# Patient Record
Sex: Female | Born: 1935
Health system: Southern US, Community
[De-identification: ages and names within clinical notes are randomized; demographics above are authoritative.]

## PROBLEM LIST (undated history)

## (undated) DIAGNOSIS — Z9049 Acquired absence of other specified parts of digestive tract: Secondary | ICD-10-CM

## (undated) DIAGNOSIS — E669 Obesity, unspecified: Secondary | ICD-10-CM

## (undated) DIAGNOSIS — Z7901 Long term (current) use of anticoagulants: Secondary | ICD-10-CM

## (undated) DIAGNOSIS — H9190 Unspecified hearing loss, unspecified ear: Secondary | ICD-10-CM

## (undated) DIAGNOSIS — E041 Nontoxic single thyroid nodule: Secondary | ICD-10-CM

## (undated) DIAGNOSIS — E785 Hyperlipidemia, unspecified: Secondary | ICD-10-CM

## (undated) DIAGNOSIS — Z8719 Personal history of other diseases of the digestive system: Secondary | ICD-10-CM

## (undated) DIAGNOSIS — I10 Essential (primary) hypertension: Secondary | ICD-10-CM

## (undated) DIAGNOSIS — I82409 Acute embolism and thrombosis of unspecified deep veins of unspecified lower extremity: Secondary | ICD-10-CM

## (undated) DIAGNOSIS — D682 Hereditary deficiency of other clotting factors: Secondary | ICD-10-CM

## (undated) DIAGNOSIS — M1990 Unspecified osteoarthritis, unspecified site: Secondary | ICD-10-CM

## (undated) DIAGNOSIS — I5189 Other ill-defined heart diseases: Secondary | ICD-10-CM

## (undated) DIAGNOSIS — I700 Atherosclerosis of aorta: Secondary | ICD-10-CM

## (undated) DIAGNOSIS — L989 Disorder of the skin and subcutaneous tissue, unspecified: Secondary | ICD-10-CM

## (undated) DIAGNOSIS — C801 Malignant (primary) neoplasm, unspecified: Secondary | ICD-10-CM

## (undated) DIAGNOSIS — D696 Thrombocytopenia, unspecified: Secondary | ICD-10-CM

## (undated) DIAGNOSIS — D6851 Activated protein C resistance: Secondary | ICD-10-CM

## (undated) DIAGNOSIS — Z96649 Presence of unspecified artificial hip joint: Secondary | ICD-10-CM

## (undated) DIAGNOSIS — N183 Chronic kidney disease, stage 3 unspecified: Secondary | ICD-10-CM

## (undated) DIAGNOSIS — R7303 Prediabetes: Secondary | ICD-10-CM

## (undated) DIAGNOSIS — R0600 Dyspnea, unspecified: Secondary | ICD-10-CM

## (undated) DIAGNOSIS — I8393 Asymptomatic varicose veins of bilateral lower extremities: Secondary | ICD-10-CM

## (undated) DIAGNOSIS — M1712 Unilateral primary osteoarthritis, left knee: Secondary | ICD-10-CM

## (undated) DIAGNOSIS — K219 Gastro-esophageal reflux disease without esophagitis: Secondary | ICD-10-CM

## (undated) DIAGNOSIS — I2699 Other pulmonary embolism without acute cor pulmonale: Secondary | ICD-10-CM

## (undated) DIAGNOSIS — M810 Age-related osteoporosis without current pathological fracture: Secondary | ICD-10-CM

## (undated) DIAGNOSIS — R7309 Other abnormal glucose: Secondary | ICD-10-CM

## (undated) DIAGNOSIS — R630 Anorexia: Secondary | ICD-10-CM

## (undated) DIAGNOSIS — I4891 Unspecified atrial fibrillation: Secondary | ICD-10-CM

## (undated) DIAGNOSIS — J189 Pneumonia, unspecified organism: Secondary | ICD-10-CM

## (undated) DIAGNOSIS — D508 Other iron deficiency anemias: Secondary | ICD-10-CM

## (undated) DIAGNOSIS — E7800 Pure hypercholesterolemia, unspecified: Secondary | ICD-10-CM

## (undated) HISTORY — DX: Unspecified hearing loss, unspecified ear: H91.90

## (undated) HISTORY — PX: CATARACT EXTRACTION: SUR2

## (undated) HISTORY — DX: Other iron deficiency anemias: D50.8

## (undated) HISTORY — DX: Essential (primary) hypertension: I10

## (undated) HISTORY — DX: Other abnormal glucose: R73.09

## (undated) HISTORY — DX: Age-related osteoporosis without current pathological fracture: M81.0

## (undated) HISTORY — PX: KNEE ARTHROSCOPY: SUR90

## (undated) HISTORY — PX: COLONOSCOPY: SHX174

## (undated) HISTORY — DX: Unspecified osteoarthritis, unspecified site: M19.90

## (undated) HISTORY — DX: Hyperlipidemia, unspecified: E78.5

## (undated) HISTORY — DX: Chronic kidney disease, stage 3 unspecified: N18.30

## (undated) HISTORY — DX: Presence of unspecified artificial hip joint: Z96.649

## (undated) HISTORY — DX: Atherosclerosis of aorta: I70.0

## (undated) HISTORY — DX: Asymptomatic varicose veins of bilateral lower extremities: I83.93

## (undated) HISTORY — PX: RETINAL LASER PROCEDURE: SHX2339

## (undated) HISTORY — DX: Obesity, unspecified: E66.9

## (undated) HISTORY — DX: Disorder of the skin and subcutaneous tissue, unspecified: L98.9

## (undated) HISTORY — DX: Nontoxic single thyroid nodule: E04.1

## (undated) HISTORY — DX: Pure hypercholesterolemia, unspecified: E78.00

## (undated) HISTORY — DX: Acquired absence of other specified parts of digestive tract: Z90.49

## (undated) HISTORY — DX: Hereditary deficiency of other clotting factors: D68.2

## (undated) HISTORY — DX: Unspecified atrial fibrillation: I48.91

## (undated) HISTORY — DX: Other pulmonary embolism without acute cor pulmonale: I26.99

## (undated) HISTORY — DX: Anorexia: R63.0

## (undated) HISTORY — DX: Long term (current) use of anticoagulants: Z79.01

## (undated) HISTORY — DX: Other ill-defined heart diseases: I51.89

## (undated) HISTORY — DX: Acute embolism and thrombosis of unspecified deep veins of unspecified lower extremity: I82.409

## (undated) HISTORY — PX: APPENDECTOMY: SHX54

## (undated) HISTORY — DX: Thrombocytopenia, unspecified: D69.6

## (undated) HISTORY — DX: Prediabetes: R73.03

## (undated) HISTORY — PX: VENA CAVA FILTER PLACEMENT: SUR1032

---

## 1971-10-18 HISTORY — PX: ABDOMINAL HYSTERECTOMY: SHX81

## 2001-08-24 ENCOUNTER — Encounter: Payer: Self-pay | Admitting: Internal Medicine

## 2001-08-24 ENCOUNTER — Encounter: Admission: RE | Admit: 2001-08-24 | Discharge: 2001-08-24 | Payer: Self-pay | Admitting: Internal Medicine

## 2002-09-10 ENCOUNTER — Encounter: Admission: RE | Admit: 2002-09-10 | Discharge: 2002-09-10 | Payer: Self-pay | Admitting: Internal Medicine

## 2002-09-10 ENCOUNTER — Encounter: Payer: Self-pay | Admitting: Internal Medicine

## 2002-10-17 HISTORY — PX: KNEE ARTHROSCOPY: SUR90

## 2002-11-11 ENCOUNTER — Ambulatory Visit (HOSPITAL_COMMUNITY): Admission: RE | Admit: 2002-11-11 | Discharge: 2002-11-11 | Payer: Self-pay | Admitting: Gastroenterology

## 2003-10-15 ENCOUNTER — Encounter: Admission: RE | Admit: 2003-10-15 | Discharge: 2003-10-15 | Payer: Self-pay | Admitting: Internal Medicine

## 2004-11-19 ENCOUNTER — Other Ambulatory Visit: Admission: RE | Admit: 2004-11-19 | Discharge: 2004-11-19 | Payer: Self-pay | Admitting: Internal Medicine

## 2004-11-23 ENCOUNTER — Encounter: Admission: RE | Admit: 2004-11-23 | Discharge: 2004-11-23 | Payer: Self-pay | Admitting: Internal Medicine

## 2004-11-29 ENCOUNTER — Encounter: Admission: RE | Admit: 2004-11-29 | Discharge: 2004-11-29 | Payer: Self-pay | Admitting: Internal Medicine

## 2006-12-22 ENCOUNTER — Encounter: Admission: RE | Admit: 2006-12-22 | Discharge: 2006-12-22 | Payer: Self-pay | Admitting: Internal Medicine

## 2008-02-18 ENCOUNTER — Encounter: Admission: RE | Admit: 2008-02-18 | Discharge: 2008-02-18 | Payer: Self-pay | Admitting: Internal Medicine

## 2009-03-12 ENCOUNTER — Encounter: Admission: RE | Admit: 2009-03-12 | Discharge: 2009-03-12 | Payer: Self-pay | Admitting: Internal Medicine

## 2009-10-17 DIAGNOSIS — I2699 Other pulmonary embolism without acute cor pulmonale: Secondary | ICD-10-CM

## 2009-10-17 HISTORY — DX: Other pulmonary embolism without acute cor pulmonale: I26.99

## 2009-10-17 HISTORY — PX: VENA CAVA FILTER PLACEMENT: SUR1032

## 2009-12-02 ENCOUNTER — Ambulatory Visit: Payer: Self-pay | Admitting: Internal Medicine

## 2009-12-02 ENCOUNTER — Inpatient Hospital Stay (HOSPITAL_COMMUNITY): Admission: EM | Admit: 2009-12-02 | Discharge: 2010-01-01 | Payer: Self-pay | Admitting: *Deleted

## 2009-12-02 ENCOUNTER — Encounter: Payer: Self-pay | Admitting: Internal Medicine

## 2009-12-03 ENCOUNTER — Ambulatory Visit: Payer: Self-pay | Admitting: Vascular Surgery

## 2009-12-03 ENCOUNTER — Encounter (INDEPENDENT_AMBULATORY_CARE_PROVIDER_SITE_OTHER): Payer: Self-pay | Admitting: Internal Medicine

## 2009-12-04 ENCOUNTER — Encounter (INDEPENDENT_AMBULATORY_CARE_PROVIDER_SITE_OTHER): Payer: Self-pay | Admitting: Pulmonary Disease

## 2009-12-17 ENCOUNTER — Encounter: Payer: Self-pay | Admitting: Internal Medicine

## 2009-12-17 ENCOUNTER — Ambulatory Visit: Payer: Self-pay | Admitting: Vascular Surgery

## 2009-12-21 ENCOUNTER — Encounter (INDEPENDENT_AMBULATORY_CARE_PROVIDER_SITE_OTHER): Payer: Self-pay | Admitting: Pulmonary Disease

## 2009-12-22 ENCOUNTER — Ambulatory Visit: Payer: Self-pay | Admitting: Oncology

## 2009-12-29 ENCOUNTER — Ambulatory Visit: Payer: Self-pay | Admitting: Physical Medicine & Rehabilitation

## 2010-01-11 ENCOUNTER — Telehealth: Payer: Self-pay | Admitting: Internal Medicine

## 2010-01-14 ENCOUNTER — Ambulatory Visit: Payer: Self-pay | Admitting: Emergency Medicine

## 2010-01-14 ENCOUNTER — Encounter: Payer: Self-pay | Admitting: Internal Medicine

## 2010-01-14 DIAGNOSIS — I2699 Other pulmonary embolism without acute cor pulmonale: Secondary | ICD-10-CM | POA: Insufficient documentation

## 2010-01-14 DIAGNOSIS — J90 Pleural effusion, not elsewhere classified: Secondary | ICD-10-CM | POA: Insufficient documentation

## 2010-01-14 DIAGNOSIS — R222 Localized swelling, mass and lump, trunk: Secondary | ICD-10-CM | POA: Insufficient documentation

## 2010-01-15 LAB — CONVERTED CEMR LAB
BUN: 8 mg/dL (ref 6–23)
Basophils Absolute: 0 10*3/uL (ref 0.0–0.1)
Basophils Relative: 0.2 % (ref 0.0–3.0)
CO2: 29 meq/L (ref 19–32)
Calcium: 8.7 mg/dL (ref 8.4–10.5)
Chloride: 99 meq/L (ref 96–112)
Creatinine, Ser: 0.8 mg/dL (ref 0.4–1.2)
Eosinophils Absolute: 0 10*3/uL (ref 0.0–0.7)
Eosinophils Relative: 0.6 % (ref 0.0–5.0)
GFR calc non Af Amer: 74.41 mL/min (ref >60)
Glucose, Bld: 109 mg/dL — ABNORMAL HIGH (ref 70–99)
HCT: 30.1 % — ABNORMAL LOW (ref 36.0–46.0)
Hemoglobin: 9.6 g/dL — ABNORMAL LOW (ref 12.0–15.0)
INR: 1.2 — ABNORMAL HIGH (ref 0.8–1.0)
Lymphocytes Relative: 15.6 % (ref 12.0–46.0)
Lymphs Abs: 1.2 10*3/uL (ref 0.7–4.0)
MCHC: 32 g/dL (ref 30.0–36.0)
MCV: 89.7 fL (ref 78.0–100.0)
Monocytes Absolute: 0.8 10*3/uL (ref 0.1–1.0)
Monocytes Relative: 10.7 % (ref 3.0–12.0)
Neutro Abs: 5.7 10*3/uL (ref 1.4–7.7)
Neutrophils Relative %: 72.9 % (ref 43.0–77.0)
Platelets: 190 10*3/uL (ref 150.0–400.0)
Potassium: 4.1 meq/L (ref 3.5–5.1)
Pro B Natriuretic peptide (BNP): 115 pg/mL — ABNORMAL HIGH (ref 0.0–100.0)
Prothrombin Time: 12.1 s — ABNORMAL HIGH (ref 9.1–11.7)
RBC: 3.36 M/uL — ABNORMAL LOW (ref 3.87–5.11)
RDW: 18.3 % — ABNORMAL HIGH (ref 11.5–14.6)
Sodium: 134 meq/L — ABNORMAL LOW (ref 135–145)
TSH: 2.71 microintl units/mL (ref 0.35–5.50)
WBC: 7.7 10*3/uL (ref 4.5–10.5)
aPTT: 33.8 s — ABNORMAL HIGH (ref 21.7–28.8)

## 2010-01-18 ENCOUNTER — Ambulatory Visit (HOSPITAL_COMMUNITY): Admission: RE | Admit: 2010-01-18 | Discharge: 2010-01-18 | Payer: Self-pay | Admitting: Emergency Medicine

## 2010-01-19 ENCOUNTER — Ambulatory Visit: Payer: Self-pay | Admitting: Emergency Medicine

## 2010-01-22 ENCOUNTER — Ambulatory Visit (HOSPITAL_COMMUNITY): Admission: RE | Admit: 2010-01-22 | Discharge: 2010-01-22 | Payer: Self-pay | Admitting: Emergency Medicine

## 2010-01-27 ENCOUNTER — Ambulatory Visit (HOSPITAL_COMMUNITY): Admission: RE | Admit: 2010-01-27 | Discharge: 2010-01-27 | Payer: Self-pay | Admitting: Internal Medicine

## 2010-01-29 ENCOUNTER — Ambulatory Visit: Payer: Self-pay | Admitting: Emergency Medicine

## 2010-02-04 ENCOUNTER — Ambulatory Visit (HOSPITAL_COMMUNITY): Admission: RE | Admit: 2010-02-04 | Discharge: 2010-02-04 | Payer: Self-pay | Admitting: Internal Medicine

## 2010-02-08 ENCOUNTER — Ambulatory Visit (HOSPITAL_COMMUNITY): Admission: RE | Admit: 2010-02-08 | Discharge: 2010-02-08 | Payer: Self-pay | Admitting: Emergency Medicine

## 2010-02-08 ENCOUNTER — Encounter: Payer: Self-pay | Admitting: Emergency Medicine

## 2010-02-08 ENCOUNTER — Ambulatory Visit: Payer: Self-pay | Admitting: Cardiology

## 2010-02-08 ENCOUNTER — Ambulatory Visit: Payer: Self-pay

## 2010-02-25 ENCOUNTER — Ambulatory Visit (HOSPITAL_COMMUNITY): Admission: RE | Admit: 2010-02-25 | Discharge: 2010-02-25 | Payer: Self-pay | Admitting: Internal Medicine

## 2010-04-20 ENCOUNTER — Encounter: Admission: RE | Admit: 2010-04-20 | Discharge: 2010-04-20 | Payer: Self-pay | Admitting: Internal Medicine

## 2010-11-07 ENCOUNTER — Encounter (HOSPITAL_BASED_OUTPATIENT_CLINIC_OR_DEPARTMENT_OTHER): Payer: Self-pay | Admitting: Internal Medicine

## 2010-11-16 NOTE — Assessment & Plan Note (Signed)
Summary: Hx PE, R effusion.    Visit Type:  Follow-up  CC:  The patient is here for follow-up. She says her breathing has improved and the cough is better but she stiull brings up some clear mucus..  History of Present Illness: 77 yo female admitted 2-16-3/18/11 for Cardiopulmonary arrest, VDRF, PE . She was admitted by Critical Care Team during her stay.   January 14, 2010 --Pt presents for a post hospital office visit. She was admitted after a  witnessed arrest at home requiring CPR-She did regain rhythm -rapid A-Fib-this was secondary to PE-found to have a LLL DVT on doppler. She was started on anticoagulation. However had an abdominal hematoma , so a IVC filter was placed. Her anticoagulation was stopped d/t a suprarenal /right rectus sheath hematoma . She was found to have a heterozygous factor V Leiden mutation. Her A fib was tx w/ beta blockers. CT showed a Right middle lobe lung mass, tx for presumed infection w/ IV abx. This will need follow up xray/CT scan to verify clearance. She was discharged to SNF for rehab. She is undergoing PT/OT,  can stand w/ walker, unable to walk w/o assistance. Family is with her today. Her/Their plan is to discharge home-she lived alone prior to admit. Breathing is better but she wears out easily. Wears O2 now at 2 l/m . Taken off O2 today w/ sat at 89% on room air sitting. She was started back on Lovenox 2 days ago. Brusing along right side has subsided w/ no increase, no known bleeding. Denies chest pain, dyspnea, orthopnea, hemoptysis, fever, n/v/d, headache. Leg swelling is worse in evening.   ROV 01/19/10 --  Reviewed the above. She is s/p R thoracentesis. Fluid analysis consistent with an exudate, cytology negative. No evidence that this was a hemothorx. Her lovenox was held for the procedure,   Current Medications (verified): 1)  Zebeta 5 Mg Tabs (Bisoprolol Fumarate) .... 1/2 Tab By Mouth Once Daily 2)  Ensure  Liqd (Nutritional Supplements) .... Three  Times A Day W/ Meals 3)  Duragesic-50 50 Mcg/hr Pt72 (Fentanyl) .... Change Every 72 Hours 4)  Robitussin Dm 100-10 Mg/29ml Syrp (Dextromethorphan-Guaifenesin) .Marland Kitchen.. 10ml Four Times A Day 5)  Omeprazole 20 Mg Cpdr (Omeprazole) .... Take 1 Capsule By Mouth Two Times A Day 6)  Ultram 50 Mg Tabs (Tramadol Hcl) .... Take 1 Tablet By Mouth Four Times A Day 7)  Tylenol Arthritis Pain 650 Mg Cr-Tabs (Acetaminophen) .Marland Kitchen.. 1 Every 6 Hours As Needed Pain or Fever 8)  Albuterol Sulfate (2.5 Mg/59ml) 0.083% Nebu (Albuterol Sulfate) .Marland Kitchen.. 1 Neb Every 6 Hours As Needed 9)  Trazodone Hcl 50 Mg Tabs (Trazodone Hcl) .... Take 1 Tab By Mouth At Bedtime As Needed Insomnia 10)  Oxygen .... Wear 2l/min Continuously  Allergies (verified): No Known Drug Allergies  Vital Signs:  Patient profile:   77 year old female Height:      64 inches (162.56 cm) Weight:      196.25 pounds (89.20 kg) BMI:     33.81 O2 Sat:      92 % on 2 L/min Temp:     98.0 degrees F (36.67 degrees C) oral Pulse rate:   82 / minute BP sitting:   126 / 80  (left arm) Cuff size:   regular  Vitals Entered By: Michel Bickers CMA (January 19, 2010 2:16 PM)  O2 Sat at Rest %:  92 O2 Flow:  2 L/min  Physical Exam  Additional Exam:  GEN: A/Ox3; pleasant ,elderly female in wheelchair HEENT:  [place]/AT, , EACs-clear, TMs-wnl, NOSE-clear, THROAT-clear NECK:  Supple w/ fair ROM; no JVD; normal carotid impulses w/o bruits; no thyromegaly or nodules palpated; no lymphadenopathy. RESP  CTA w/ no wheezing.  CARD:  RRR, no m/r/g   GI:   Soft & nt; nml bowel sounds; no organomegaly or masses detected. Musco: Warm bil,  no calf tenderness  clubbing, pulses intact, stasis dermatatic changes, 2+edema  Neuro: intact w/ no focal deficits noted.  Skin: intact, no eccymosis noted.    Impression & Recommendations:  Problem # 1:  PLEURAL EFFUSION, RIGHT (ICD-511.9) Exudative, cytology negative, etiology not yet clear - repeat thora (drain dry) and cytology - CT  scan to follow to eval for possible R mass seen in hospital  Problem # 2:  PULMONARY EMBOLISM (ICD-415.19) - will not restart lovenox yet, pending procedure   The following medications were removed from the medication list:    Lovenox 60 Mg/0.64ml Soln (Enoxaparin sodium) ..... Every 12 hours  Orders: Est. Patient Level IV (16109) Echo Referral (Echo)  Other Orders: Radiology Referral (Radiology)  Patient Instructions: 1)  We will repeat your thoracentesis and cytology 2)  A CT scan of the chest will be performed after the thoracentesis to compare to the hospital 3)  We will perform an echocardiogram  4)  Do not restart your lovenox at this time 5)  Follow up with Dr Delton Coombes on 4/15 as planned

## 2010-11-16 NOTE — Assessment & Plan Note (Signed)
Summary: NP follow up - post hosp   CC:  post follow up - states breathing is doing well but having increased edema in both legs/feet and worse in the left where the blood clot was.  states the swelling began x3-4days ago.  Marland Kitchen  History of Present Illness: 77 yo female admitted 2-16-3/18/11 for Cardiopulmonary arrest, VDRF, PE . She was admitted by Critical Care Team during her stay.   January 14, 2010 --Pt presents for a post hospital office visit. She was admitted after a  witnessed arrest at home requiring CPR-She did regain rhythm -rapid A-Fib-this was secondary to PE-found to have a LLL DVT on doppler. She was started on anticoagulation. However had an abdominal hematoma , so a IVC filter was placed. Her anticoagulation was stopped d/t a suprarenal /right rectus sheath hematoma . She was found to have a heterozygous factor V Leiden mutation. Her A fib was tx w/ beta blockers. CT showed a Right middle lobe lung mass, tx for presumed infection w/ IV abx. This will need follow up xray/CT scan to verify clearance. She was discharged to SNF for rehab. She is undergoing PT/OT,  can stand w/ walker, unable to walk w/o assistance. Family is with her today. Her/Their plan is to discharge home-she lived alone prior to admit. Breathing is better but she wears out easily. Wears O2 now at 2 l/m . Taken off O2 today w/ sat at 89% on room air sitting. She was started back on Lovenox 2 days ago. Brusing along right side has subsided w/ no increase, no known bleeding. Denies chest pain, dyspnea, orthopnea, hemoptysis, fever, n/v/d, headache. Leg swelling is worse in evening.   Preventive Screening-Counseling & Management  Alcohol-Tobacco     Smoking Status: never  Medications Prior to Update: 1)  None  Current Medications (verified): 1)  Zebeta 5 Mg Tabs (Bisoprolol Fumarate) .... 1/2 Tab By Mouth Once Daily 2)  Ensure  Liqd (Nutritional Supplements) .... Three Times A Day W/ Meals 3)  Duragesic-50 50 Mcg/hr  Pt72 (Fentanyl) .... Change Every 72 Hours 4)  Robitussin Dm 100-10 Mg/85ml Syrp (Dextromethorphan-Guaifenesin) .Marland Kitchen.. 10ml Four Times A Day 5)  Omeprazole 20 Mg Cpdr (Omeprazole) .... Take 1 Capsule By Mouth Two Times A Day 6)  Ultram 50 Mg Tabs (Tramadol Hcl) .... Take 1 Tablet By Mouth Four Times A Day 7)  Tylenol Arthritis Pain 650 Mg Cr-Tabs (Acetaminophen) .Marland Kitchen.. 1 Every 6 Hours As Needed Pain or Fever 8)  Albuterol Sulfate (2.5 Mg/54ml) 0.083% Nebu (Albuterol Sulfate) .Marland Kitchen.. 1 Neb Every 6 Hours As Needed 9)  Trazodone Hcl 50 Mg Tabs (Trazodone Hcl) .... Take 1 Tab By Mouth At Bedtime As Needed Insomnia 10)  Lovenox 60 Mg/0.92ml Soln (Enoxaparin Sodium) .... Every 12 Hours 11)  Oxygen .... Wear 2l/min Continuously  Allergies (verified): No Known Drug Allergies  Past History:  Family History: Last updated: 01/14/2010 clotting disorders - sister has factor-5 rheumatism - mother DM - sister, brother stroke -  brother  Social History: Last updated: 01/14/2010 never smoked no alcohol widowed 5 children retired: [employer] lives alone at home discharged to [facility] 01/01/10 (for short term rehab)  Risk Factors: Smoking Status: never (01/14/2010)  Past Medical History: 1. Cardiopulmomary Arrest 12/02/09 2/2 PE/Left leg DVT w/ prolonged hospitalization w/ CPR/VDRF/Rapid Afib/Abdominal Hematoma/s/p IVC filter.     Family History: clotting disorders - sister has factor-5 rheumatism - mother DM - sister, brother stroke -  brother  Social History: never smoked no alcohol widowed  5 children retired: [employer] lives alone at home discharged to [facility] 01/01/10 (for short term rehab)Smoking Status:  never  Review of Systems      See HPI  Vital Signs:  Patient profile:   77 year old female Height:      64 inches Weight:      198.50 pounds BMI:     34.20 O2 Sat:      95 % on 2 L/min cont Temp:     96.9 degrees F oral Pulse rate:   78 / minute BP sitting:   162 / 60   (left arm) Cuff size:   regular  Vitals Entered By: Boone Master CNA (January 14, 2010 9:18 AM)  O2 Flow:  2 L/min cont CC: post follow up - states breathing is doing well but having increased edema in both legs/feet, worse in the left where the blood clot was.  states the swelling began x3-4days ago.   Is Patient Diabetic? No Comments Medications reviewed with patient Daytime contact number verified with patient. Boone Master CNA  January 14, 2010 9:23 AM  Oxygen sat. 89% off oxygen for 5-10 mins.HR 74 (pt. sitting).Placed back on O2,sat. returned to 91% within 2 mins. Elray Buba RN  January 14, 2010 11:10 AM    Physical Exam  Additional Exam:  GEN: A/Ox3; pleasant ,elderly female in wheelchair HEENT:  [place]/AT, , EACs-clear, TMs-wnl, NOSE-clear, THROAT-clear NECK:  Supple w/ fair ROM; no JVD; normal carotid impulses w/o bruits; no thyromegaly or nodules palpated; no lymphadenopathy. RESP  CTA w/ no wheezing.  CARD:  RRR, no m/r/g   GI:   Soft & nt; nml bowel sounds; no organomegaly or masses detected. Musco: Warm bil,  no calf tenderness  clubbing, pulses intact, stasis dermatatic changes, 2+edema  Neuro: intact w/ no focal deficits noted.  Skin: intact, no eccymosis noted.    Impression & Recommendations:  Problem # 1:  PULMONARY EMBOLISM (ICD-415.19) PE w/ assocaited Cardiopulmonary Arrest w/ CPR/rapid Atrial Fib/ VDRF-s/p prolonged hospitalization now discharged to SNF.  She has been started back on Lovenox (01/12/10) with planned bridge to coumadin. No known bleeding. , will follow closely.  Labs today w/ PT/INR.  found to have left leg DVT on doppler.  cont on O2 for now, eval for wean at follow up in 2 weeks.  she is s/p IVC d/t to abdominal hematoma- no brusing noted. will need to follow closely.   Her updated medication list for this problem includes:    Lovenox 60 Mg/0.46ml Soln (Enoxaparin sodium) ..... Every 12 hours  Orders: TLB-BMP (Basic Metabolic Panel-BMET)  (80048-METABOL) TLB-CBC Platelet - w/Differential (85025-CBCD) TLB-TSH (Thyroid Stimulating Hormone) (84443-TSH) TLB-BNP (B-Natriuretic Peptide) (83880-BNPR) TLB-PT (Protime) (85610-PTP) TLB-PTT (85730-PTTL) T-2 View CXR (71020TC) Est. Patient Level IV (57846)  Problem # 2:  MASS, LUNG (ICD-786.6)  RML lung mass ?infectious in nature- she was tx w/ IV abx Will check xray today, pending those results decide on CT chest.   Orders: Est. Patient Level IV (96295)  Medications Added to Medication List This Visit: 1)  Zebeta 5 Mg Tabs (Bisoprolol fumarate) .... 1/2 tab by mouth once daily 2)  Ensure Liqd (Nutritional supplements) .... Three times a day w/ meals 3)  Duragesic-50 50 Mcg/hr Pt72 (Fentanyl) .... Change every 72 hours 4)  Robitussin Dm 100-10 Mg/49ml Syrp (Dextromethorphan-guaifenesin) .Marland Kitchen.. 10ml four times a day 5)  Omeprazole 20 Mg Cpdr (Omeprazole) .... Take 1 capsule by mouth two times a day 6)  Ultram 50 Mg Tabs (  Tramadol hcl) .... Take 1 tablet by mouth four times a day 7)  Tylenol Arthritis Pain 650 Mg Cr-tabs (Acetaminophen) .Marland Kitchen.. 1 every 6 hours as needed pain or fever 8)  Albuterol Sulfate (2.5 Mg/83ml) 0.083% Nebu (Albuterol sulfate) .Marland Kitchen.. 1 neb every 6 hours as needed 9)  Trazodone Hcl 50 Mg Tabs (Trazodone hcl) .... Take 1 tab by mouth at bedtime as needed insomnia 10)  Lovenox 60 Mg/0.32ml Soln (Enoxaparin sodium) .... Every 12 hours 11)  Oxygen  .... Wear 2l/min continuously  Complete Medication List: 1)  Zebeta 5 Mg Tabs (Bisoprolol fumarate) .... 1/2 tab by mouth once daily 2)  Ensure Liqd (Nutritional supplements) .... Three times a day w/ meals 3)  Duragesic-50 50 Mcg/hr Pt72 (Fentanyl) .... Change every 72 hours 4)  Robitussin Dm 100-10 Mg/63ml Syrp (Dextromethorphan-guaifenesin) .Marland Kitchen.. 10ml four times a day 5)  Omeprazole 20 Mg Cpdr (Omeprazole) .... Take 1 capsule by mouth two times a day 6)  Ultram 50 Mg Tabs (Tramadol hcl) .... Take 1 tablet by mouth four times  a day 7)  Tylenol Arthritis Pain 650 Mg Cr-tabs (Acetaminophen) .Marland Kitchen.. 1 every 6 hours as needed pain or fever 8)  Albuterol Sulfate (2.5 Mg/22ml) 0.083% Nebu (Albuterol sulfate) .Marland Kitchen.. 1 neb every 6 hours as needed 9)  Trazodone Hcl 50 Mg Tabs (Trazodone hcl) .... Take 1 tab by mouth at bedtime as needed insomnia 10)  Lovenox 60 Mg/0.85ml Soln (Enoxaparin sodium) .... Every 12 hours 11)  Oxygen  .... Wear 2l/min continuously  Patient Instructions: 1)  follow up Dr. Delton Coombes in 2 weeks.  2)  I will call with the xray and lab results. - [phone] 3)  Keep legs elevated when sitting  4)  Low salt diet.  5)  Please contact office for sooner follow up if symptoms do not improve or worsen    Immunization History:  Influenza Immunization History:    Influenza:  historical (08/17/2009)  Pneumovax Immunization History:    Pneumovax:  historical (10/17/2009)   Appended Document: NP follow up - post hosp faxed via biscom to Dr. Ivery Quale per TP's request.

## 2010-11-16 NOTE — Assessment & Plan Note (Signed)
Summary: PE, effusion, Ct scan   Visit Type:  Follow-up  CC:  Pt here for follow up. Pt states breathing is much better since last OV .  History of Present Illness: 77 yo female admitted 2-16-3/18/11 for Cardiopulmonary arrest, VDRF, PE . She was admitted by Critical Care Team during her stay.   January 14, 2010 --Pt presents for a post hospital office visit. She was admitted after a  witnessed arrest at home requiring CPR-She did regain rhythm -rapid A-Fib-this was secondary to PE-found to have a LLL DVT on doppler. She was started on anticoagulation. However had an abdominal hematoma , so a IVC filter was placed. Her anticoagulation was stopped d/t a suprarenal /right rectus sheath hematoma . She was found to have a heterozygous factor V Leiden mutation. Her A fib was tx w/ beta blockers. CT showed a Right middle lobe lung mass, tx for presumed infection w/ IV abx. This will need follow up xray/CT scan to verify clearance. She was discharged to SNF for rehab. She is undergoing PT/OT,  can stand w/ walker, unable to walk w/o assistance. Family is with her today. Her/Their plan is to discharge home-she lived alone prior to admit. Breathing is better but she wears out easily. Wears O2 now at 2 l/m . Taken off O2 today w/ sat at 89% on room air sitting. She was started back on Lovenox 2 days ago. Brusing along right side has subsided w/ no increase, no known bleeding. Denies chest pain, dyspnea, orthopnea, hemoptysis, fever, n/v/d, headache. Leg swelling is worse in evening.   ROV 01/19/10 --  Reviewed the above. She is s/p R thoracentesis. Fluid analysis consistent with an exudate, cytology negative. No evidence that this was a hemothorx. Her lovenox was held for the procedure,   ROV 01/29/10 -- returns for f/u of her PE/DVT and R pleural effusion. We repeated her thora and then checked a CT scan to better eval the parenchyma. Showed no mass, see below. She is having some RLE pain in her ankle. Breathing is  much better since the repeat thora.   Current Medications (verified): 1)  Zebeta 5 Mg Tabs (Bisoprolol Fumarate) .... 1/2 Tab By Mouth Once Daily 2)  Ensure  Liqd (Nutritional Supplements) .... Three Times A Day W/ Meals 3)  Duragesic-50 50 Mcg/hr Pt72 (Fentanyl) .... Change Every 72 Hours 4)  Robitussin Dm 100-10 Mg/76ml Syrp (Dextromethorphan-Guaifenesin) .Marland Kitchen.. 10ml Four Times A Day 5)  Omeprazole 20 Mg Cpdr (Omeprazole) .... Take 1 Capsule By Mouth Two Times A Day 6)  Ultram 50 Mg Tabs (Tramadol Hcl) .... Take 1 Tablet By Mouth Four Times A Day 7)  Tylenol Arthritis Pain 650 Mg Cr-Tabs (Acetaminophen) .Marland Kitchen.. 1 Every 6 Hours As Needed Pain or Fever 8)  Albuterol Sulfate (2.5 Mg/100ml) 0.083% Nebu (Albuterol Sulfate) .Marland Kitchen.. 1 Neb Every 6 Hours As Needed 9)  Trazodone Hcl 50 Mg Tabs (Trazodone Hcl) .... Take 1 Tab By Mouth At Bedtime As Needed Insomnia 10)  Oxygen .... Wear 2l/min Continuously 11)  Vancocin Hcl 250 Mg Caps (Vancomycin Hcl) .... Take 1 Tablet By Mouth Four Times A Day 12)  Flora-Q  Caps (Probiotic Product) .... Take 1 Tablet By Mouth Two Times A Day 13)  Prostat 64 .Marland Kitchen.. 30ml By Mouth Two Times A Day 14)  Medpass .Marland Kitchen.. 6 Ounces By Mouth Three Times A Day 15)  Imodium A-D 2 Mg Tabs (Loperamide Hcl) .... Take 1 Tablet By Mouth Three Times A Day As Needed 16)  Ceftazidime 1 Gm Solr (Ceftazidime) .... Iv Every 8 Hours X 10days 17)  Gentamicin 3mg /kg Iv .... Once Daily X 10 Days  Allergies (verified): No Known Drug Allergies  Vital Signs:  Patient profile:   77 year old female Height:      64 inches Weight:      196 pounds O2 Sat:      98 % on 2 L/min Temp:     97.7 degrees F oral Pulse rate:   72 / minute BP sitting:   122 / 62  (right arm) Cuff size:   large  Vitals Entered By: Zackery Barefoot CMA (January 29, 2010 3:00 PM)  O2 Flow:  2 L/min CC: Pt here for follow up. Pt states breathing is much better since last OV  Comments Medications reviewed with patient Verified  contact number and pharmacy with patient Zackery Barefoot CMA  January 29, 2010 3:03 PM    Physical Exam  Additional Exam:  GEN: A/Ox3; pleasant ,elderly female in wheelchair HEENT:  [place]/AT, , EACs-clear, TMs-wnl, NOSE-clear, THROAT-clear NECK:  Supple w/ fair ROM; no JVD; normal carotid impulses w/o bruits; no thyromegaly or nodules palpated; no lymphadenopathy. RESP  CTA w/ no wheezing.  CARD:  RRR, no m/r/g   GI:   Soft & nt; nml bowel sounds; no organomegaly or masses detected. Musco: Warm bil, area of tenderness medial R ankle ,no warmth or erythema, pulses intact, stasis dermatatic changes, 2+edema  Neuro: intact w/ no focal deficits noted.  Skin: intact, no eccymosis noted.    CT of Chest  Procedure date:  01/22/2010  Findings:      IMPRESSION:   1.  The lesion of concern in the right lung appears to have resolved and likely represented atelectatic lung. 2.  There is still some visualized thrombus in the left lower lobe pulmonary artery. 3.  Healing bilateral rib fractures and healing mid sternal fracture. 4.  Evolutionary changes in the right adrenal hematoma. 5.  Reduced size of the right pleural effusion.  There is passive atelectasis at the right lung base.  Impression & Recommendations:  Problem # 1:  PULMONARY EMBOLISM (ICD-415.19) Risks and benefits of being back on lovenox + coumadin discussed. I believe she could go back on for at least 6 mo if not forever (for A Fib). Alternatively, she is protected from huge recurrence by IVCF. I will defer to Dr Jarold Motto regarding restarting the anticoagulation - he needs to be comfortable managing this.  Orders: Est. Patient Level IV (16109)  Problem # 2:  PLEURAL EFFUSION, RIGHT (ICD-511.9) resolved Orders: Est. Patient Level IV (60454)  Problem # 3:  MASS, LUNG (ICD-786.6) no mass seen - was atelectasis due to effusion.  Orders: Est. Patient Level IV (09811)  Medications Added to Medication List This Visit: 1)   Vancocin Hcl 250 Mg Caps (Vancomycin hcl) .... Take 1 tablet by mouth four times a day 2)  Flora-q Caps (Probiotic product) .... Take 1 tablet by mouth two times a day 3)  Prostat 64  .Marland Kitchen.. 30ml by mouth two times a day 4)  Medpass  .Marland Kitchen.. 6 ounces by mouth three times a day 5)  Imodium A-d 2 Mg Tabs (Loperamide hcl) .... Take 1 tablet by mouth three times a day as needed 6)  Ceftazidime 1 Gm Solr (Ceftazidime) .... Iv every 8 hours x 10days 7)  Gentamicin 3mg /kg Iv  .... Once daily x 10 days  Patient Instructions: 1)  Your CT scan of the chest shows  that your lung is well expanded after removal of pleural fluid. There is no mass present.  2)  I would like for you to discuss the possibility of restarting lovenox and coumadin with Dr Jarold Motto. It would be reasonable to take these medications for 6 months, but only if Dr Jarold Motto believes you can be followed closely and agrees that it is worth the risk of bleeding.  3)  Continue your oxygen 4)  Follow up with Dr Delton Coombes if your breathing changes in any way.

## 2010-11-16 NOTE — Miscellaneous (Signed)
Summary: Orders/[facility]  Orders/[facility]   Imported By: Lester Harleigh 01/25/2010 10:32:53  _____________________________________________________________________  External Attachment:    Type:   Image     Comment:   External Document

## 2010-11-16 NOTE — Progress Notes (Signed)
Summary: returned call  Phone Note Call from Patient   Caller: Daughter Call For: ramaswamy Summary of Call: daughter was returning call from [employer]. sandra at [phone] Initial call taken by: Tivis Ringer, CNA,  January 11, 2010 1:43 PM  Follow-up for Phone Call        advised needed HFU scheduled. pt schedueld to see RB on 01/29/10. Carron Curie CMA  January 12, 2010 3:29 PM

## 2010-11-16 NOTE — Consult Note (Signed)
Summary: [facility]  [facility]   Imported By: Lester Larchwood 01/28/2010 09:36:42  _____________________________________________________________________  External Attachment:    Type:   Image     Comment:   External Document

## 2010-12-16 IMAGING — CR DG ABD PORTABLE 1V
1 series · 1 of 1 positions shown · non-contrast
Comparison: None.

CLINICAL DATA: Panda placement

ABDOMEN - 1 VIEW

[AP]
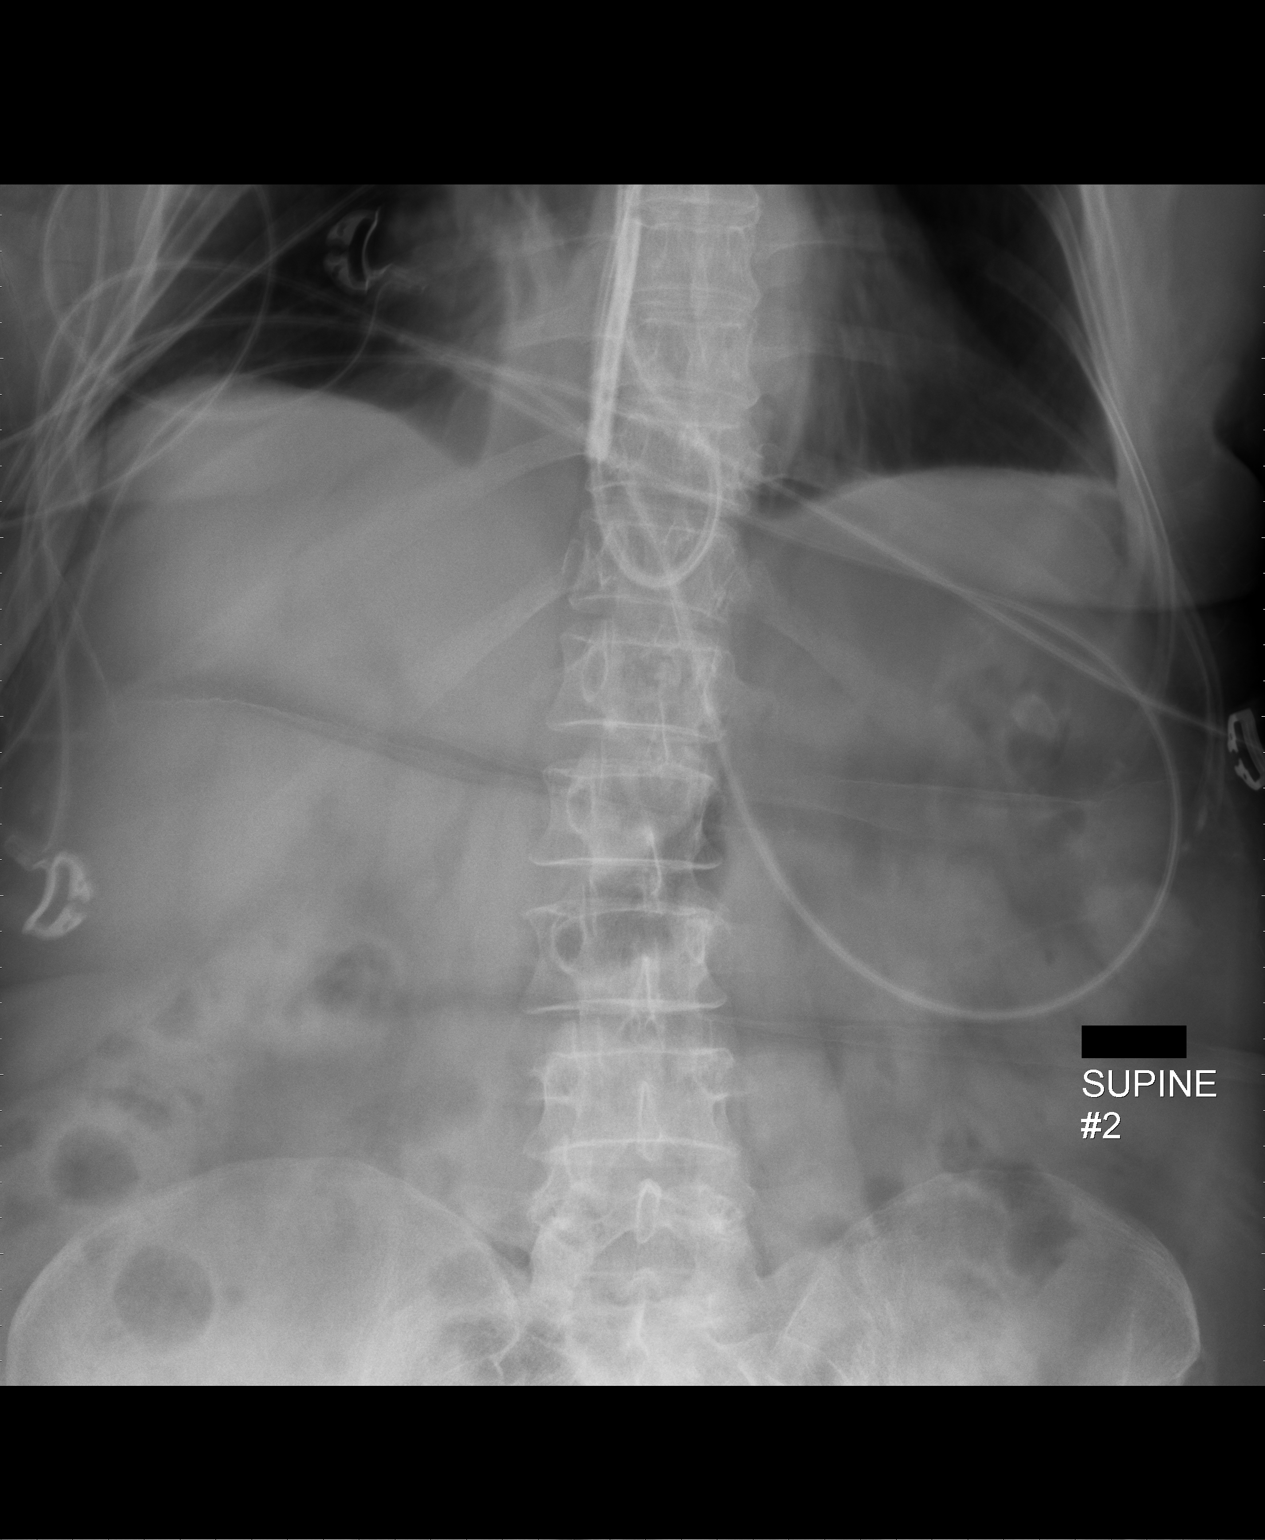

[1 of 1 positions shown; findings below may reference images not displayed]

FINDINGS: Panda tube loops back on itself at the GE junction with
the tip pointing upward into the distal esophagus.  Incidentally,
the bowel gas pattern is unremarkable.
IMPRESSION: Suboptimal position of the Panda tube.

## 2010-12-16 IMAGING — CR DG ABD PORTABLE 1V
1 series · 1 of 1 positions shown · non-contrast
Comparison: 12/14/2009

CLINICAL DATA: Panda tube placement.

ABDOMEN - 1 VIEW

[AP]
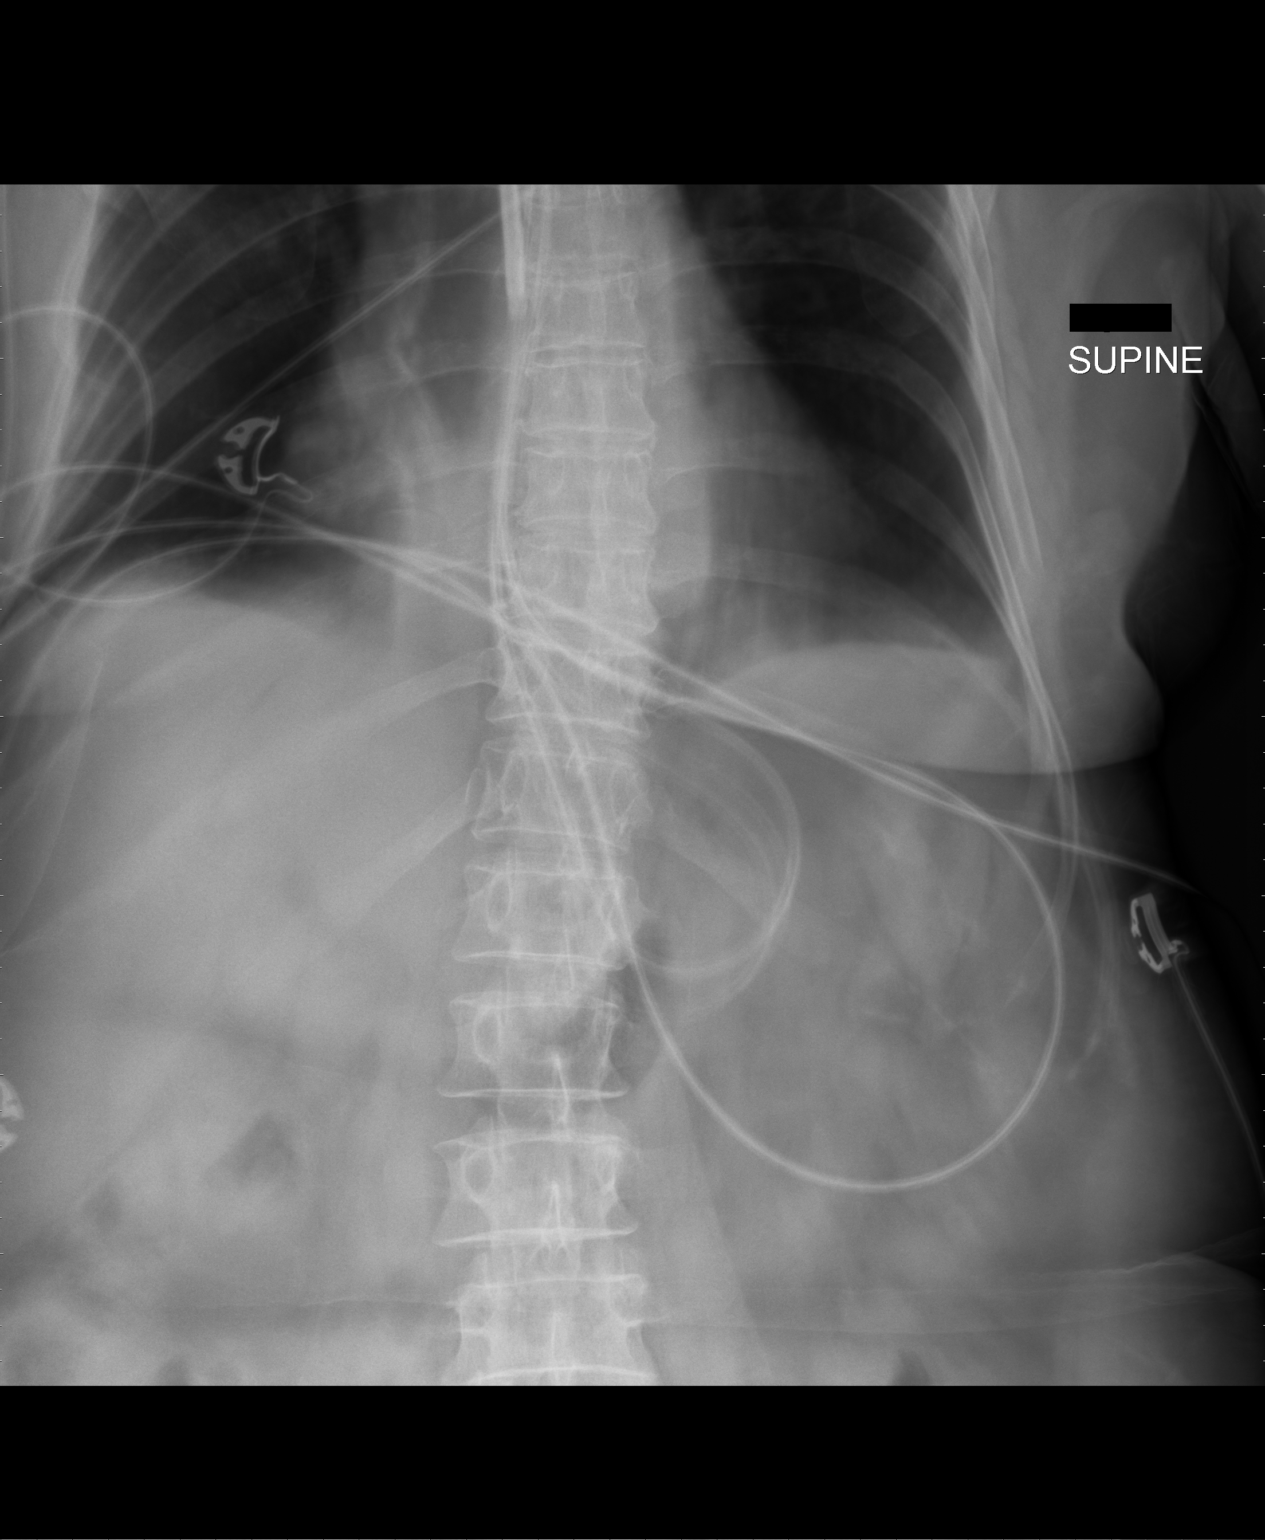

[1 of 1 positions shown; findings below may reference images not displayed]

FINDINGS: Panda tube loops into the stomach and back up into the
esophagus above the level of the carina.  There is atelectasis at
the right lung base medially.
IMPRESSION: Panda tube and the tip is in the proximal esophagus after looping
into the stomach.  The position is inadequate.

## 2010-12-16 IMAGING — CR DG ABD PORTABLE 1V
1 series · 1 of 1 positions shown · non-contrast
Comparison: 12/14/2009

CLINICAL DATA: Possible PE.  Panda tube adjustment.

ABDOMEN - 1 VIEW

[AP]
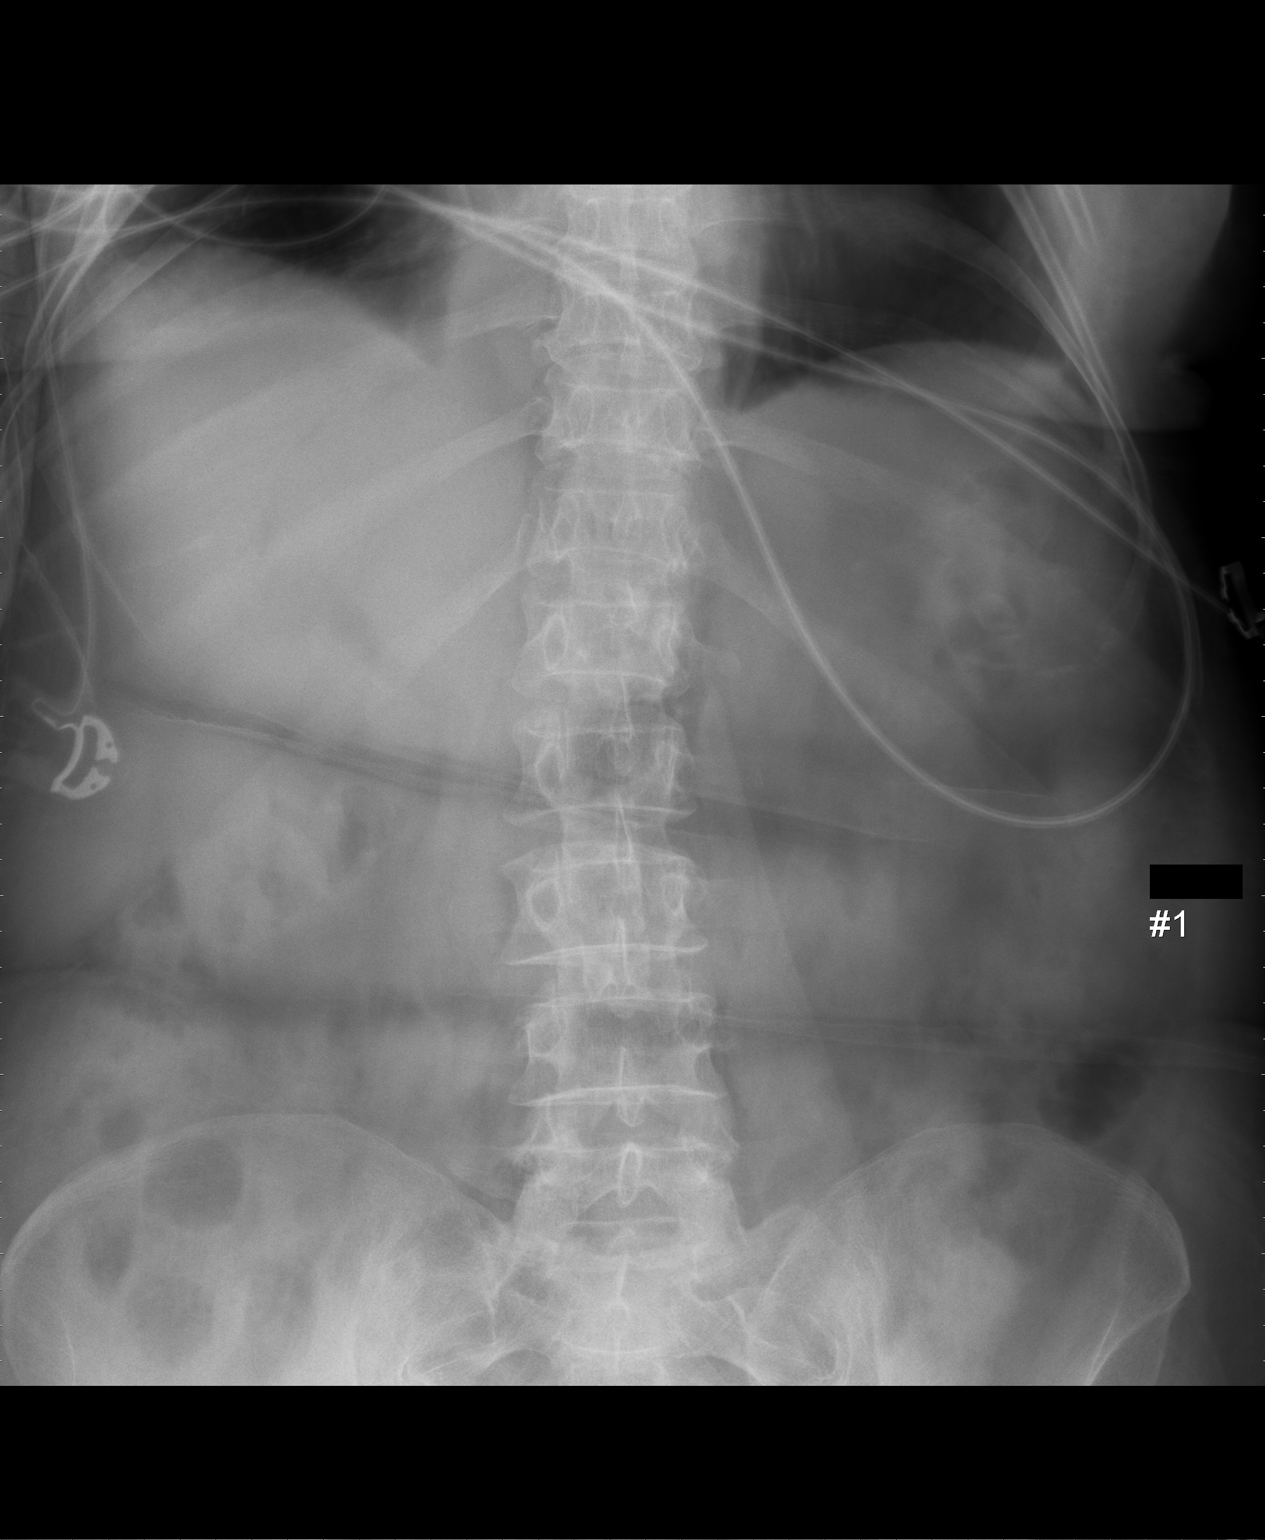

[1 of 1 positions shown; findings below may reference images not displayed]

FINDINGS: Portable exam is performed at [DATE] a.m.  Feeding tube
has been placed with tip overlying the level of the mid to lower
thoracic esophagus. Catheter is later repositioned.
IMPRESSION: Feeding tube tip overlying the level of the thoracic esophagus.

## 2010-12-16 IMAGING — CR DG CHEST 1V PORT
1 series · 1 of 1 positions shown · non-contrast
Comparison: 12/13/2009

CLINICAL DATA: Respiratory distress

PORTABLE CHEST - 1 VIEW

[AP]
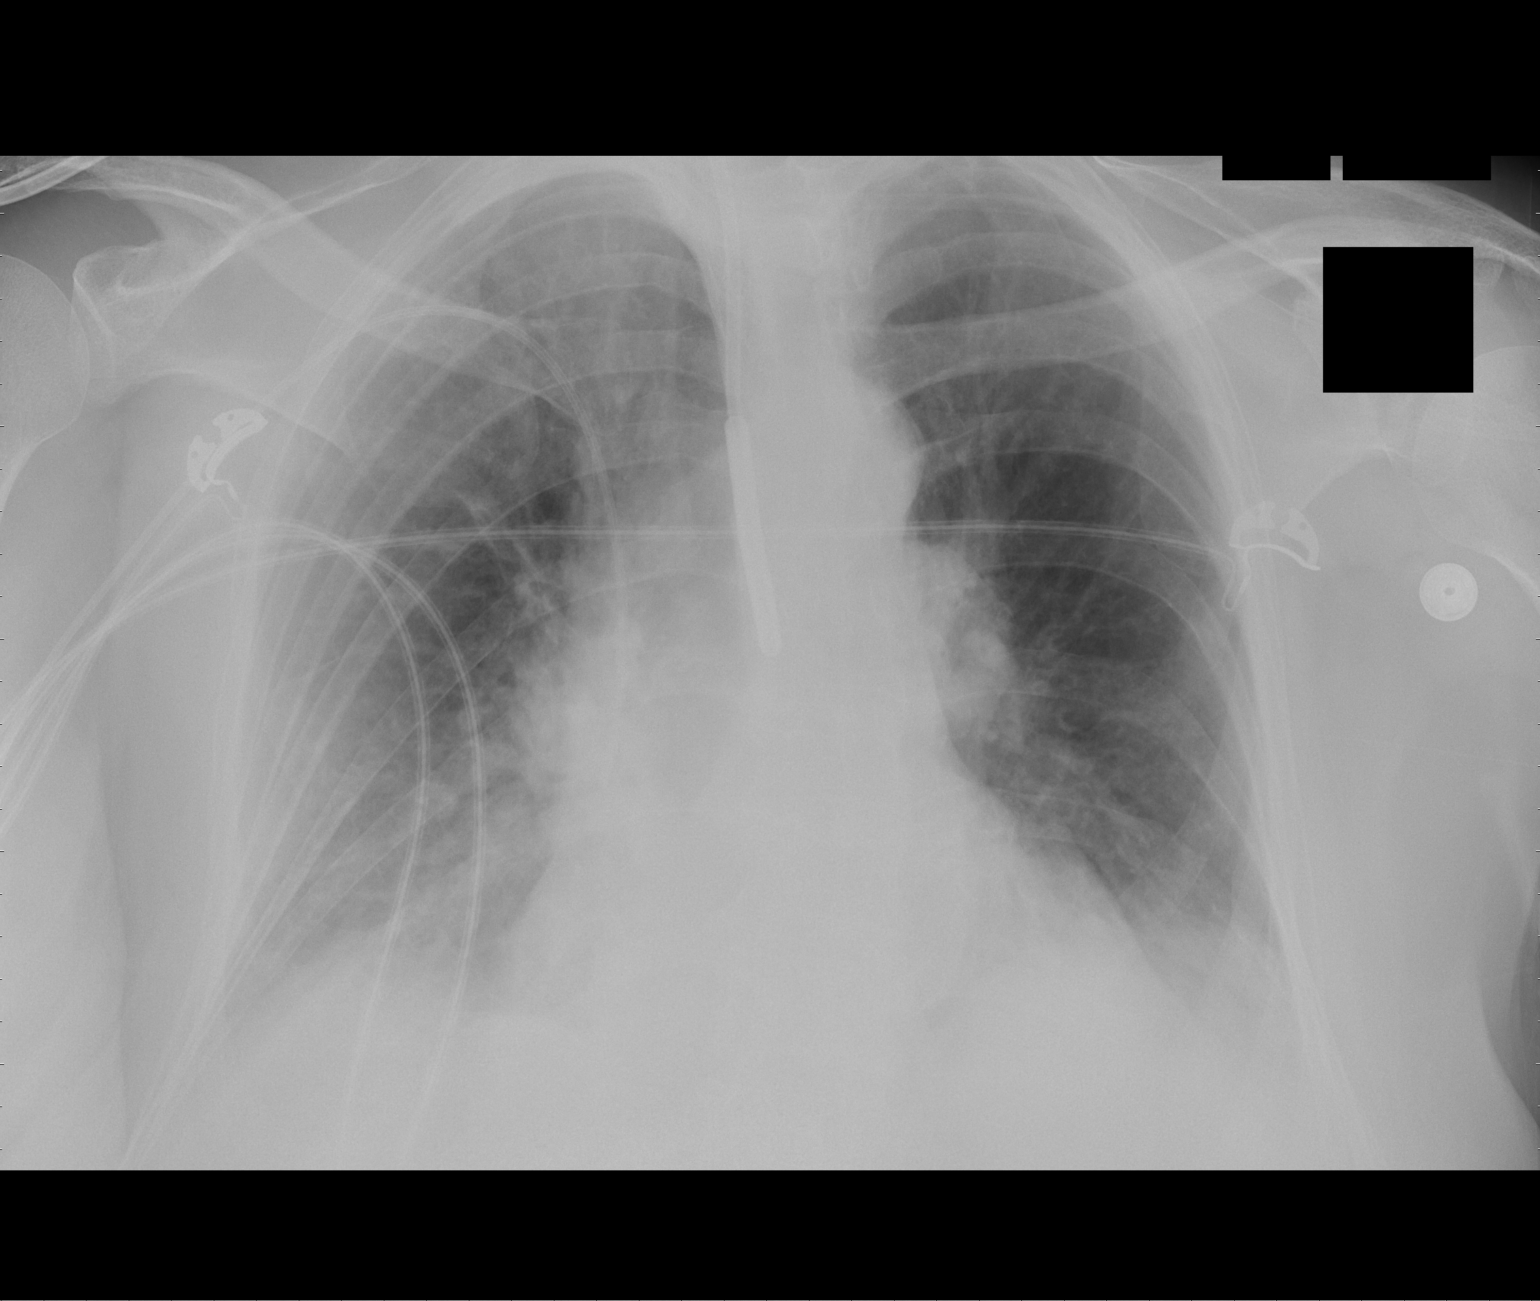

[1 of 1 positions shown; findings below may reference images not displayed]

FINDINGS: Atelectasis at the bases.  Cardiomegaly.  No CHF.  Panda
feeding tube is in the mid esophagus and needs to be advanced.
Central venous catheter is in the mid to lower SVC. Bony thorax
grossly intact.
IMPRESSION: Atelectasis at the bases.  Panda tube tip is in the region of the
mid esophagus. I have called report to the floor at 0202 hours.

## 2010-12-18 IMAGING — CR DG CHEST 1V PORT
1 series · 1 of 1 positions shown · non-contrast
Comparison: Portable chest x-ray of 12/14/2009

CLINICAL DATA: Respiratory distress, follow-up

PORTABLE CHEST - 1 VIEW

[AP]
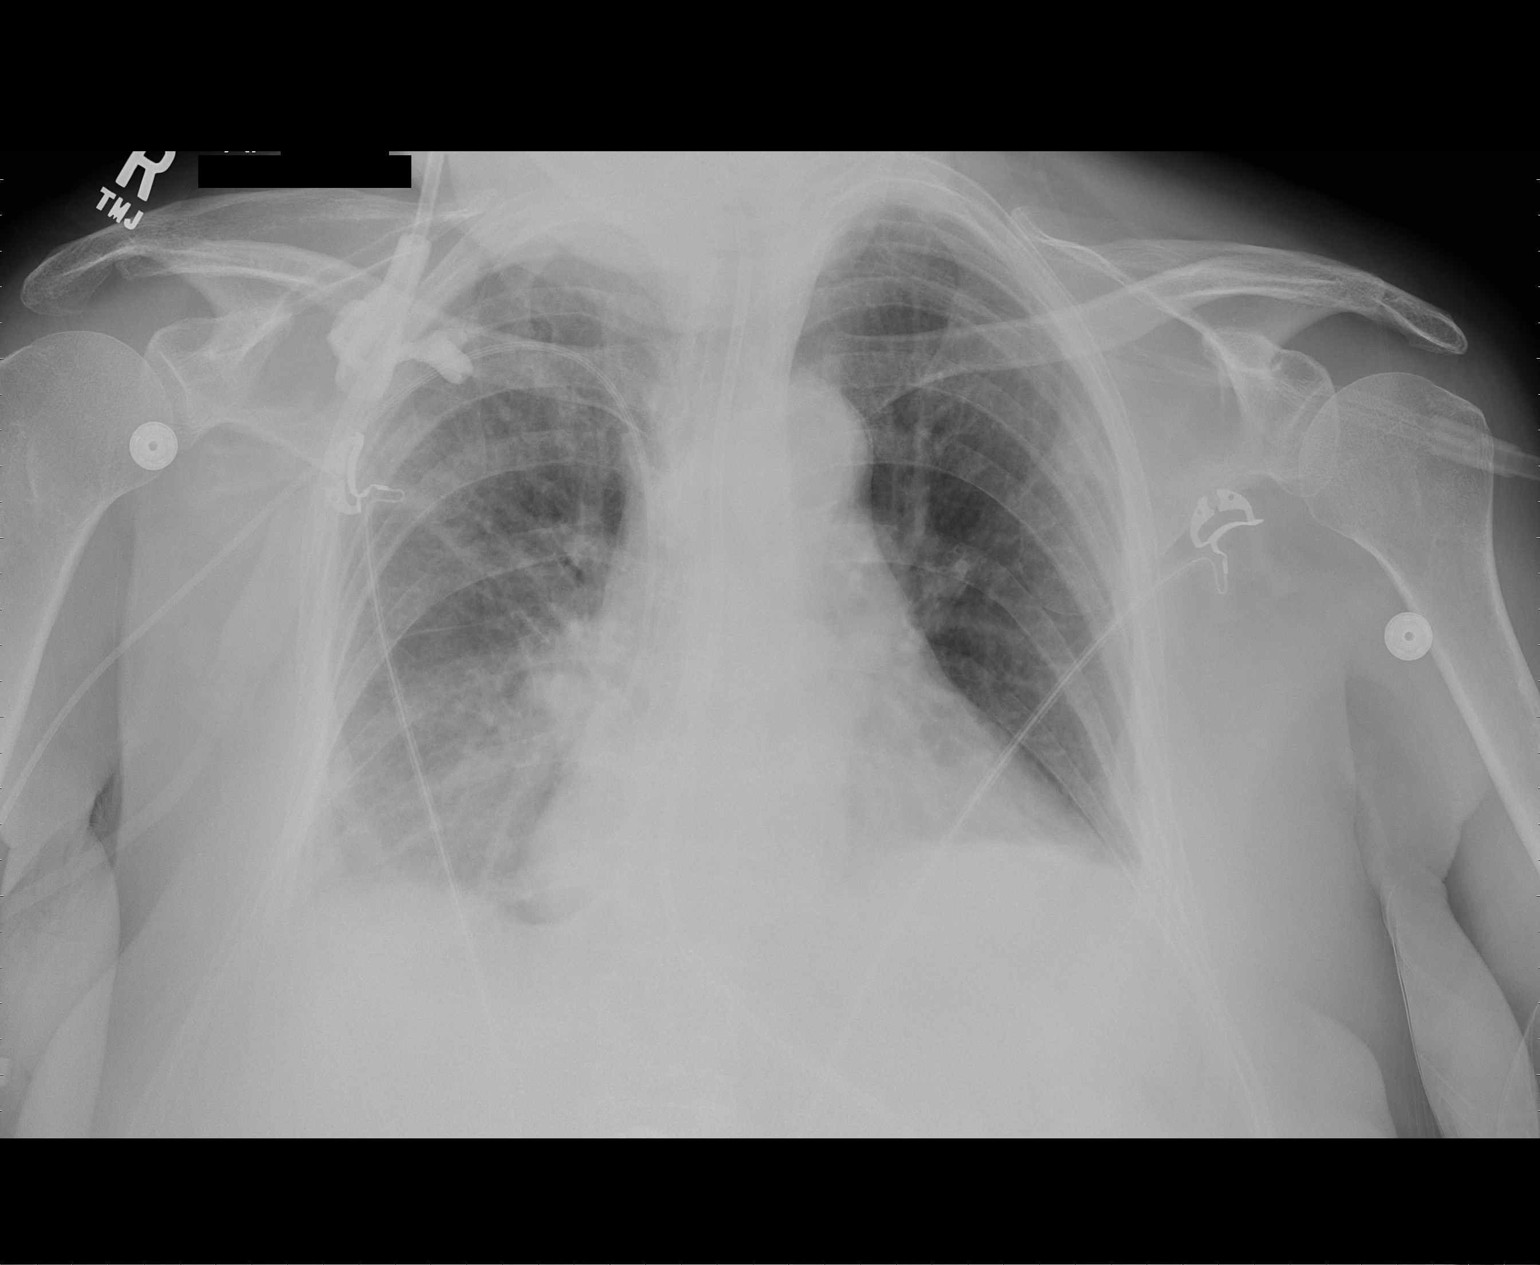

[1 of 1 positions shown; findings below may reference images not displayed]

FINDINGS: The lungs remain poorly aerated with basilar opacity
consistent with atelectasis and probable effusions.  Right central
venous catheter tip is in the lower SVC near the expected right
atrial junction.  The feeding tube has been advanced into the
stomach.
IMPRESSION: No change in poor aeration with basilar atelectasis and possible
small effusions.

## 2011-01-05 LAB — BODY FLUID CULTURE
Culture: NO GROWTH
Culture: NO GROWTH
Gram Stain: NONE SEEN
Gram Stain: NONE SEEN

## 2011-01-05 LAB — LACTATE DEHYDROGENASE, PLEURAL OR PERITONEAL FLUID
LD, Fluid: 184 U/L — ABNORMAL HIGH (ref 3–23)
LD, Fluid: 339 U/L — ABNORMAL HIGH (ref 3–23)

## 2011-01-05 LAB — FUNGUS CULTURE W SMEAR
Fungal Smear: NONE SEEN
Fungal Smear: NONE SEEN

## 2011-01-05 LAB — GLUCOSE, SEROUS FLUID
Glucose, Fluid: 110 mg/dL
Glucose, Fluid: 132 mg/dL

## 2011-01-05 LAB — PROTEIN, BODY FLUID
Total protein, fluid: 3.9 g/dL
Total protein, fluid: 4.2 g/dL

## 2011-01-05 LAB — BODY FLUID CELL COUNT WITH DIFFERENTIAL
Eos, Fluid: 0 %
Eos, Fluid: 5 %
Lymphs, Fluid: 49 %
Lymphs, Fluid: 56 %
Monocyte-Macrophage-Serous Fluid: 12 % — ABNORMAL LOW (ref 50–90)
Monocyte-Macrophage-Serous Fluid: 34 % — ABNORMAL LOW (ref 50–90)
Neutrophil Count, Fluid: 39 % — ABNORMAL HIGH (ref 0–25)
Neutrophil Count, Fluid: 5 % (ref 0–25)
Total Nucleated Cell Count, Fluid: 1915 cu mm — ABNORMAL HIGH (ref 0–1000)
Total Nucleated Cell Count, Fluid: 4895 cu mm — ABNORMAL HIGH (ref 0–1000)

## 2011-01-05 LAB — PATHOLOGIST SMEAR REVIEW

## 2011-01-05 LAB — PH, BODY FLUID
pH, Fluid: 7.5
pH, Fluid: 7.5

## 2011-01-06 LAB — BASIC METABOLIC PANEL
BUN: 20 mg/dL (ref 6–23)
BUN: 33 mg/dL — ABNORMAL HIGH (ref 6–23)
BUN: 34 mg/dL — ABNORMAL HIGH (ref 6–23)
BUN: 43 mg/dL — ABNORMAL HIGH (ref 6–23)
BUN: 50 mg/dL — ABNORMAL HIGH (ref 6–23)
BUN: 52 mg/dL — ABNORMAL HIGH (ref 6–23)
BUN: 53 mg/dL — ABNORMAL HIGH (ref 6–23)
BUN: 53 mg/dL — ABNORMAL HIGH (ref 6–23)
BUN: 53 mg/dL — ABNORMAL HIGH (ref 6–23)
BUN: 54 mg/dL — ABNORMAL HIGH (ref 6–23)
CO2: 19 mEq/L (ref 19–32)
CO2: 19 mEq/L (ref 19–32)
CO2: 22 mEq/L (ref 19–32)
CO2: 23 mEq/L (ref 19–32)
CO2: 24 mEq/L (ref 19–32)
CO2: 24 mEq/L (ref 19–32)
CO2: 25 mEq/L (ref 19–32)
CO2: 26 mEq/L (ref 19–32)
CO2: 26 mEq/L (ref 19–32)
CO2: 28 mEq/L (ref 19–32)
Calcium: 6.8 mg/dL — ABNORMAL LOW (ref 8.4–10.5)
Calcium: 7.4 mg/dL — ABNORMAL LOW (ref 8.4–10.5)
Calcium: 7.6 mg/dL — ABNORMAL LOW (ref 8.4–10.5)
Calcium: 7.8 mg/dL — ABNORMAL LOW (ref 8.4–10.5)
Calcium: 7.8 mg/dL — ABNORMAL LOW (ref 8.4–10.5)
Calcium: 7.8 mg/dL — ABNORMAL LOW (ref 8.4–10.5)
Calcium: 8 mg/dL — ABNORMAL LOW (ref 8.4–10.5)
Calcium: 8.1 mg/dL — ABNORMAL LOW (ref 8.4–10.5)
Calcium: 8.1 mg/dL — ABNORMAL LOW (ref 8.4–10.5)
Calcium: 8.2 mg/dL — ABNORMAL LOW (ref 8.4–10.5)
Chloride: 108 mEq/L (ref 96–112)
Chloride: 111 mEq/L (ref 96–112)
Chloride: 111 mEq/L (ref 96–112)
Chloride: 112 mEq/L (ref 96–112)
Chloride: 112 mEq/L (ref 96–112)
Chloride: 113 mEq/L — ABNORMAL HIGH (ref 96–112)
Chloride: 113 mEq/L — ABNORMAL HIGH (ref 96–112)
Chloride: 114 mEq/L — ABNORMAL HIGH (ref 96–112)
Chloride: 114 mEq/L — ABNORMAL HIGH (ref 96–112)
Chloride: 116 mEq/L — ABNORMAL HIGH (ref 96–112)
Creatinine, Ser: 1.3 mg/dL — ABNORMAL HIGH (ref 0.4–1.2)
Creatinine, Ser: 1.57 mg/dL — ABNORMAL HIGH (ref 0.4–1.2)
Creatinine, Ser: 1.58 mg/dL — ABNORMAL HIGH (ref 0.4–1.2)
Creatinine, Ser: 1.58 mg/dL — ABNORMAL HIGH (ref 0.4–1.2)
Creatinine, Ser: 1.6 mg/dL — ABNORMAL HIGH (ref 0.4–1.2)
Creatinine, Ser: 1.61 mg/dL — ABNORMAL HIGH (ref 0.4–1.2)
Creatinine, Ser: 1.69 mg/dL — ABNORMAL HIGH (ref 0.4–1.2)
Creatinine, Ser: 1.69 mg/dL — ABNORMAL HIGH (ref 0.4–1.2)
Creatinine, Ser: 1.69 mg/dL — ABNORMAL HIGH (ref 0.4–1.2)
Creatinine, Ser: 1.72 mg/dL — ABNORMAL HIGH (ref 0.4–1.2)
GFR calc Af Amer: 35 mL/min — ABNORMAL LOW (ref >60)
GFR calc Af Amer: 36 mL/min — ABNORMAL LOW (ref >60)
GFR calc Af Amer: 36 mL/min — ABNORMAL LOW (ref >60)
GFR calc Af Amer: 36 mL/min — ABNORMAL LOW (ref >60)
GFR calc Af Amer: 38 mL/min — ABNORMAL LOW (ref >60)
GFR calc Af Amer: 38 mL/min — ABNORMAL LOW (ref >60)
GFR calc Af Amer: 39 mL/min — ABNORMAL LOW (ref >60)
GFR calc Af Amer: 39 mL/min — ABNORMAL LOW (ref >60)
GFR calc Af Amer: 39 mL/min — ABNORMAL LOW (ref >60)
GFR calc Af Amer: 48 mL/min — ABNORMAL LOW (ref >60)
GFR calc non Af Amer: 29 mL/min — ABNORMAL LOW (ref >60)
GFR calc non Af Amer: 30 mL/min — ABNORMAL LOW (ref >60)
GFR calc non Af Amer: 30 mL/min — ABNORMAL LOW (ref >60)
GFR calc non Af Amer: 30 mL/min — ABNORMAL LOW (ref >60)
GFR calc non Af Amer: 31 mL/min — ABNORMAL LOW (ref >60)
GFR calc non Af Amer: 32 mL/min — ABNORMAL LOW (ref >60)
GFR calc non Af Amer: 32 mL/min — ABNORMAL LOW (ref >60)
GFR calc non Af Amer: 32 mL/min — ABNORMAL LOW (ref >60)
GFR calc non Af Amer: 32 mL/min — ABNORMAL LOW (ref >60)
GFR calc non Af Amer: 40 mL/min — ABNORMAL LOW (ref >60)
Glucose, Bld: 133 mg/dL — ABNORMAL HIGH (ref 70–99)
Glucose, Bld: 135 mg/dL — ABNORMAL HIGH (ref 70–99)
Glucose, Bld: 142 mg/dL — ABNORMAL HIGH (ref 70–99)
Glucose, Bld: 150 mg/dL — ABNORMAL HIGH (ref 70–99)
Glucose, Bld: 152 mg/dL — ABNORMAL HIGH (ref 70–99)
Glucose, Bld: 183 mg/dL — ABNORMAL HIGH (ref 70–99)
Glucose, Bld: 187 mg/dL — ABNORMAL HIGH (ref 70–99)
Glucose, Bld: 218 mg/dL — ABNORMAL HIGH (ref 70–99)
Glucose, Bld: 227 mg/dL — ABNORMAL HIGH (ref 70–99)
Glucose, Bld: 302 mg/dL — ABNORMAL HIGH (ref 70–99)
Potassium: 3.7 mEq/L (ref 3.5–5.1)
Potassium: 3.8 mEq/L (ref 3.5–5.1)
Potassium: 3.8 mEq/L (ref 3.5–5.1)
Potassium: 3.8 mEq/L (ref 3.5–5.1)
Potassium: 3.9 mEq/L (ref 3.5–5.1)
Potassium: 4.2 mEq/L (ref 3.5–5.1)
Potassium: 4.3 mEq/L (ref 3.5–5.1)
Potassium: 4.5 mEq/L (ref 3.5–5.1)
Potassium: 4.6 mEq/L (ref 3.5–5.1)
Potassium: 5.3 mEq/L — ABNORMAL HIGH (ref 3.5–5.1)
Sodium: 137 mEq/L (ref 135–145)
Sodium: 139 mEq/L (ref 135–145)
Sodium: 141 mEq/L (ref 135–145)
Sodium: 143 mEq/L (ref 135–145)
Sodium: 144 mEq/L (ref 135–145)
Sodium: 144 mEq/L (ref 135–145)
Sodium: 144 mEq/L (ref 135–145)
Sodium: 145 mEq/L (ref 135–145)
Sodium: 146 mEq/L — ABNORMAL HIGH (ref 135–145)
Sodium: 147 mEq/L — ABNORMAL HIGH (ref 135–145)

## 2011-01-06 LAB — POCT I-STAT 3, ART BLOOD GAS (G3+)
Acid-Base Excess: 2 mmol/L (ref 0.0–2.0)
Acid-base deficit: 19 mmol/L — ABNORMAL HIGH (ref 0.0–2.0)
Acid-base deficit: 2 mmol/L (ref 0.0–2.0)
Acid-base deficit: 4 mmol/L — ABNORMAL HIGH (ref 0.0–2.0)
Acid-base deficit: 6 mmol/L — ABNORMAL HIGH (ref 0.0–2.0)
Acid-base deficit: 7 mmol/L — ABNORMAL HIGH (ref 0.0–2.0)
Acid-base deficit: 7 mmol/L — ABNORMAL HIGH (ref 0.0–2.0)
Acid-base deficit: 7 mmol/L — ABNORMAL HIGH (ref 0.0–2.0)
Acid-base deficit: 9 mmol/L — ABNORMAL HIGH (ref 0.0–2.0)
Bicarbonate: 13.5 mEq/L — ABNORMAL LOW (ref 20.0–24.0)
Bicarbonate: 16.3 mEq/L — ABNORMAL LOW (ref 20.0–24.0)
Bicarbonate: 17.5 mEq/L — ABNORMAL LOW (ref 20.0–24.0)
Bicarbonate: 18.6 mEq/L — ABNORMAL LOW (ref 20.0–24.0)
Bicarbonate: 19 mEq/L — ABNORMAL LOW (ref 20.0–24.0)
Bicarbonate: 19 mEq/L — ABNORMAL LOW (ref 20.0–24.0)
Bicarbonate: 21.6 mEq/L (ref 20.0–24.0)
Bicarbonate: 22.2 mEq/L (ref 20.0–24.0)
Bicarbonate: 22.6 mEq/L (ref 20.0–24.0)
Bicarbonate: 25.9 mEq/L — ABNORMAL HIGH (ref 20.0–24.0)
O2 Saturation: 82 %
O2 Saturation: 88 %
O2 Saturation: 91 %
O2 Saturation: 94 %
O2 Saturation: 94 %
O2 Saturation: 95 %
O2 Saturation: 95 %
O2 Saturation: 95 %
O2 Saturation: 97 %
O2 Saturation: 99 %
Patient temperature: 101.4
Patient temperature: 37
Patient temperature: 37.3
Patient temperature: 38
Patient temperature: 98.4
Patient temperature: 98.7
Patient temperature: 99
TCO2: 16 mmol/L (ref 0–100)
TCO2: 17 mmol/L (ref 0–100)
TCO2: 18 mmol/L (ref 0–100)
TCO2: 20 mmol/L (ref 0–100)
TCO2: 20 mmol/L (ref 0–100)
TCO2: 20 mmol/L (ref 0–100)
TCO2: 23 mmol/L (ref 0–100)
TCO2: 23 mmol/L (ref 0–100)
TCO2: 23 mmol/L (ref 0–100)
TCO2: 27 mmol/L (ref 0–100)
pCO2 arterial: 22.7 mmHg — ABNORMAL LOW (ref 35.0–45.0)
pCO2 arterial: 29 mmHg — ABNORMAL LOW (ref 35.0–45.0)
pCO2 arterial: 30 mmHg — ABNORMAL LOW (ref 35.0–45.0)
pCO2 arterial: 32.9 mmHg — ABNORMAL LOW (ref 35.0–45.0)
pCO2 arterial: 37.5 mmHg (ref 35.0–45.0)
pCO2 arterial: 37.6 mmHg (ref 35.0–45.0)
pCO2 arterial: 38.3 mmHg (ref 35.0–45.0)
pCO2 arterial: 39.8 mmHg (ref 35.0–45.0)
pCO2 arterial: 48.8 mmHg — ABNORMAL HIGH (ref 35.0–45.0)
pCO2 arterial: 72.1 mmHg (ref 35.0–45.0)
pH, Arterial: 6.881 — CL (ref 7.350–7.400)
pH, Arterial: 7.189 — CL (ref 7.350–7.400)
pH, Arterial: 7.288 — ABNORMAL LOW (ref 7.350–7.400)
pH, Arterial: 7.312 — ABNORMAL LOW (ref 7.350–7.400)
pH, Arterial: 7.359 (ref 7.350–7.400)
pH, Arterial: 7.373 (ref 7.350–7.400)
pH, Arterial: 7.438 — ABNORMAL HIGH (ref 7.350–7.400)
pH, Arterial: 7.45 — ABNORMAL HIGH (ref 7.350–7.400)
pH, Arterial: 7.465 — ABNORMAL HIGH (ref 7.350–7.400)
pH, Arterial: 7.504 — ABNORMAL HIGH (ref 7.350–7.400)
pO2, Arterial: 155 mmHg — ABNORMAL HIGH (ref 80.0–100.0)
pO2, Arterial: 53 mmHg — ABNORMAL LOW (ref 80.0–100.0)
pO2, Arterial: 63 mmHg — ABNORMAL LOW (ref 80.0–100.0)
pO2, Arterial: 70 mmHg — ABNORMAL LOW (ref 80.0–100.0)
pO2, Arterial: 73 mmHg — ABNORMAL LOW (ref 80.0–100.0)
pO2, Arterial: 74 mmHg — ABNORMAL LOW (ref 80.0–100.0)
pO2, Arterial: 79 mmHg — ABNORMAL LOW (ref 80.0–100.0)
pO2, Arterial: 86 mmHg (ref 80.0–100.0)
pO2, Arterial: 86 mmHg (ref 80.0–100.0)
pO2, Arterial: 87 mmHg (ref 80.0–100.0)

## 2011-01-06 LAB — BRAIN NATRIURETIC PEPTIDE
Pro B Natriuretic peptide (BNP): 168 pg/mL — ABNORMAL HIGH (ref 0.0–100.0)
Pro B Natriuretic peptide (BNP): 335 pg/mL — ABNORMAL HIGH (ref 0.0–100.0)

## 2011-01-06 LAB — CBC
HCT: 17.6 % — ABNORMAL LOW (ref 36.0–46.0)
HCT: 19.2 % — ABNORMAL LOW (ref 36.0–46.0)
HCT: 20.3 % — ABNORMAL LOW (ref 36.0–46.0)
HCT: 20.3 % — ABNORMAL LOW (ref 36.0–46.0)
HCT: 20.4 % — ABNORMAL LOW (ref 36.0–46.0)
HCT: 21.4 % — ABNORMAL LOW (ref 36.0–46.0)
HCT: 21.4 % — ABNORMAL LOW (ref 36.0–46.0)
HCT: 21.5 % — ABNORMAL LOW (ref 36.0–46.0)
HCT: 21.7 % — ABNORMAL LOW (ref 36.0–46.0)
HCT: 21.8 % — ABNORMAL LOW (ref 36.0–46.0)
HCT: 22.5 % — ABNORMAL LOW (ref 36.0–46.0)
HCT: 23.4 % — ABNORMAL LOW (ref 36.0–46.0)
HCT: 23.4 % — ABNORMAL LOW (ref 36.0–46.0)
HCT: 24.8 % — ABNORMAL LOW (ref 36.0–46.0)
HCT: 30.5 % — ABNORMAL LOW (ref 36.0–46.0)
HCT: 34.8 % — ABNORMAL LOW (ref 36.0–46.0)
HCT: 36 % (ref 36.0–46.0)
Hemoglobin: 10.3 g/dL — ABNORMAL LOW (ref 12.0–15.0)
Hemoglobin: 11.5 g/dL — ABNORMAL LOW (ref 12.0–15.0)
Hemoglobin: 11.9 g/dL — ABNORMAL LOW (ref 12.0–15.0)
Hemoglobin: 6.1 g/dL — CL (ref 12.0–15.0)
Hemoglobin: 6.5 g/dL — CL (ref 12.0–15.0)
Hemoglobin: 7 g/dL — ABNORMAL LOW (ref 12.0–15.0)
Hemoglobin: 7 g/dL — ABNORMAL LOW (ref 12.0–15.0)
Hemoglobin: 7.1 g/dL — ABNORMAL LOW (ref 12.0–15.0)
Hemoglobin: 7.3 g/dL — ABNORMAL LOW (ref 12.0–15.0)
Hemoglobin: 7.4 g/dL — ABNORMAL LOW (ref 12.0–15.0)
Hemoglobin: 7.4 g/dL — ABNORMAL LOW (ref 12.0–15.0)
Hemoglobin: 7.5 g/dL — ABNORMAL LOW (ref 12.0–15.0)
Hemoglobin: 7.6 g/dL — ABNORMAL LOW (ref 12.0–15.0)
Hemoglobin: 7.7 g/dL — ABNORMAL LOW (ref 12.0–15.0)
Hemoglobin: 8.1 g/dL — ABNORMAL LOW (ref 12.0–15.0)
Hemoglobin: 8.2 g/dL — ABNORMAL LOW (ref 12.0–15.0)
Hemoglobin: 8.5 g/dL — ABNORMAL LOW (ref 12.0–15.0)
MCHC: 33 g/dL (ref 30.0–36.0)
MCHC: 33.1 g/dL (ref 30.0–36.0)
MCHC: 33.7 g/dL (ref 30.0–36.0)
MCHC: 33.9 g/dL (ref 30.0–36.0)
MCHC: 34 g/dL (ref 30.0–36.0)
MCHC: 34 g/dL (ref 30.0–36.0)
MCHC: 34.2 g/dL (ref 30.0–36.0)
MCHC: 34.2 g/dL (ref 30.0–36.0)
MCHC: 34.4 g/dL (ref 30.0–36.0)
MCHC: 34.4 g/dL (ref 30.0–36.0)
MCHC: 34.4 g/dL (ref 30.0–36.0)
MCHC: 34.6 g/dL (ref 30.0–36.0)
MCHC: 34.7 g/dL (ref 30.0–36.0)
MCHC: 34.7 g/dL (ref 30.0–36.0)
MCHC: 34.7 g/dL (ref 30.0–36.0)
MCHC: 35 g/dL (ref 30.0–36.0)
MCHC: 35.3 g/dL (ref 30.0–36.0)
MCV: 82.9 fL (ref 78.0–100.0)
MCV: 82.9 fL (ref 78.0–100.0)
MCV: 83.3 fL (ref 78.0–100.0)
MCV: 83.4 fL (ref 78.0–100.0)
MCV: 83.8 fL (ref 78.0–100.0)
MCV: 84 fL (ref 78.0–100.0)
MCV: 84.2 fL (ref 78.0–100.0)
MCV: 84.7 fL (ref 78.0–100.0)
MCV: 85 fL (ref 78.0–100.0)
MCV: 85.1 fL (ref 78.0–100.0)
MCV: 85.6 fL (ref 78.0–100.0)
MCV: 86.7 fL (ref 78.0–100.0)
MCV: 87.3 fL (ref 78.0–100.0)
MCV: 91.6 fL (ref 78.0–100.0)
MCV: 91.8 fL (ref 78.0–100.0)
MCV: 92 fL (ref 78.0–100.0)
MCV: 93.2 fL (ref 78.0–100.0)
Platelets: 100 10*3/uL — ABNORMAL LOW (ref 150–400)
Platelets: 101 10*3/uL — ABNORMAL LOW (ref 150–400)
Platelets: 102 10*3/uL — ABNORMAL LOW (ref 150–400)
Platelets: 104 10*3/uL — ABNORMAL LOW (ref 150–400)
Platelets: 105 10*3/uL — ABNORMAL LOW (ref 150–400)
Platelets: 110 10*3/uL — ABNORMAL LOW (ref 150–400)
Platelets: 111 10*3/uL — ABNORMAL LOW (ref 150–400)
Platelets: 119 10*3/uL — ABNORMAL LOW (ref 150–400)
Platelets: 154 10*3/uL (ref 150–400)
Platelets: 70 10*3/uL — ABNORMAL LOW (ref 150–400)
Platelets: 73 10*3/uL — ABNORMAL LOW (ref 150–400)
Platelets: 74 10*3/uL — ABNORMAL LOW (ref 150–400)
Platelets: 77 10*3/uL — ABNORMAL LOW (ref 150–400)
Platelets: 80 10*3/uL — ABNORMAL LOW (ref 150–400)
Platelets: 86 10*3/uL — ABNORMAL LOW (ref 150–400)
Platelets: 89 10*3/uL — ABNORMAL LOW (ref 150–400)
Platelets: 96 10*3/uL — ABNORMAL LOW (ref 150–400)
RBC: 2.12 MIL/uL — ABNORMAL LOW (ref 3.87–5.11)
RBC: 2.22 MIL/uL — ABNORMAL LOW (ref 3.87–5.11)
RBC: 2.34 MIL/uL — ABNORMAL LOW (ref 3.87–5.11)
RBC: 2.37 MIL/uL — ABNORMAL LOW (ref 3.87–5.11)
RBC: 2.38 MIL/uL — ABNORMAL LOW (ref 3.87–5.11)
RBC: 2.39 MIL/uL — ABNORMAL LOW (ref 3.87–5.11)
RBC: 2.45 MIL/uL — ABNORMAL LOW (ref 3.87–5.11)
RBC: 2.51 MIL/uL — ABNORMAL LOW (ref 3.87–5.11)
RBC: 2.55 MIL/uL — ABNORMAL LOW (ref 3.87–5.11)
RBC: 2.56 MIL/uL — ABNORMAL LOW (ref 3.87–5.11)
RBC: 2.57 MIL/uL — ABNORMAL LOW (ref 3.87–5.11)
RBC: 2.58 MIL/uL — ABNORMAL LOW (ref 3.87–5.11)
RBC: 2.71 MIL/uL — ABNORMAL LOW (ref 3.87–5.11)
RBC: 2.95 MIL/uL — ABNORMAL LOW (ref 3.87–5.11)
RBC: 3.61 MIL/uL — ABNORMAL LOW (ref 3.87–5.11)
RBC: 3.99 MIL/uL (ref 3.87–5.11)
RBC: 4.23 MIL/uL (ref 3.87–5.11)
RDW: 14.8 % (ref 11.5–15.5)
RDW: 14.8 % (ref 11.5–15.5)
RDW: 14.9 % (ref 11.5–15.5)
RDW: 14.9 % (ref 11.5–15.5)
RDW: 15 % (ref 11.5–15.5)
RDW: 15.3 % (ref 11.5–15.5)
RDW: 15.4 % (ref 11.5–15.5)
RDW: 15.4 % (ref 11.5–15.5)
RDW: 15.5 % (ref 11.5–15.5)
RDW: 15.8 % — ABNORMAL HIGH (ref 11.5–15.5)
RDW: 15.9 % — ABNORMAL HIGH (ref 11.5–15.5)
RDW: 15.9 % — ABNORMAL HIGH (ref 11.5–15.5)
RDW: 16.1 % — ABNORMAL HIGH (ref 11.5–15.5)
RDW: 16.2 % — ABNORMAL HIGH (ref 11.5–15.5)
RDW: 16.3 % — ABNORMAL HIGH (ref 11.5–15.5)
RDW: 17.2 % — ABNORMAL HIGH (ref 11.5–15.5)
RDW: 17.3 % — ABNORMAL HIGH (ref 11.5–15.5)
WBC: 11.1 10*3/uL — ABNORMAL HIGH (ref 4.0–10.5)
WBC: 11.8 10*3/uL — ABNORMAL HIGH (ref 4.0–10.5)
WBC: 12.5 10*3/uL — ABNORMAL HIGH (ref 4.0–10.5)
WBC: 12.5 10*3/uL — ABNORMAL HIGH (ref 4.0–10.5)
WBC: 12.7 10*3/uL — ABNORMAL HIGH (ref 4.0–10.5)
WBC: 12.9 10*3/uL — ABNORMAL HIGH (ref 4.0–10.5)
WBC: 13.2 10*3/uL — ABNORMAL HIGH (ref 4.0–10.5)
WBC: 13.8 10*3/uL — ABNORMAL HIGH (ref 4.0–10.5)
WBC: 17.6 10*3/uL — ABNORMAL HIGH (ref 4.0–10.5)
WBC: 17.8 10*3/uL — ABNORMAL HIGH (ref 4.0–10.5)
WBC: 18 10*3/uL — ABNORMAL HIGH (ref 4.0–10.5)
WBC: 19.3 10*3/uL — ABNORMAL HIGH (ref 4.0–10.5)
WBC: 20.1 10*3/uL — ABNORMAL HIGH (ref 4.0–10.5)
WBC: 21.6 10*3/uL — ABNORMAL HIGH (ref 4.0–10.5)
WBC: 22.5 10*3/uL — ABNORMAL HIGH (ref 4.0–10.5)
WBC: 22.6 10*3/uL — ABNORMAL HIGH (ref 4.0–10.5)
WBC: 8.3 10*3/uL (ref 4.0–10.5)

## 2011-01-06 LAB — GLUCOSE, CAPILLARY
Glucose-Capillary: 100 mg/dL — ABNORMAL HIGH (ref 70–99)
Glucose-Capillary: 101 mg/dL — ABNORMAL HIGH (ref 70–99)
Glucose-Capillary: 104 mg/dL — ABNORMAL HIGH (ref 70–99)
Glucose-Capillary: 113 mg/dL — ABNORMAL HIGH (ref 70–99)
Glucose-Capillary: 117 mg/dL — ABNORMAL HIGH (ref 70–99)
Glucose-Capillary: 118 mg/dL — ABNORMAL HIGH (ref 70–99)
Glucose-Capillary: 119 mg/dL — ABNORMAL HIGH (ref 70–99)
Glucose-Capillary: 121 mg/dL — ABNORMAL HIGH (ref 70–99)
Glucose-Capillary: 124 mg/dL — ABNORMAL HIGH (ref 70–99)
Glucose-Capillary: 125 mg/dL — ABNORMAL HIGH (ref 70–99)
Glucose-Capillary: 125 mg/dL — ABNORMAL HIGH (ref 70–99)
Glucose-Capillary: 126 mg/dL — ABNORMAL HIGH (ref 70–99)
Glucose-Capillary: 127 mg/dL — ABNORMAL HIGH (ref 70–99)
Glucose-Capillary: 128 mg/dL — ABNORMAL HIGH (ref 70–99)
Glucose-Capillary: 129 mg/dL — ABNORMAL HIGH (ref 70–99)
Glucose-Capillary: 129 mg/dL — ABNORMAL HIGH (ref 70–99)
Glucose-Capillary: 132 mg/dL — ABNORMAL HIGH (ref 70–99)
Glucose-Capillary: 133 mg/dL — ABNORMAL HIGH (ref 70–99)
Glucose-Capillary: 133 mg/dL — ABNORMAL HIGH (ref 70–99)
Glucose-Capillary: 133 mg/dL — ABNORMAL HIGH (ref 70–99)
Glucose-Capillary: 134 mg/dL — ABNORMAL HIGH (ref 70–99)
Glucose-Capillary: 134 mg/dL — ABNORMAL HIGH (ref 70–99)
Glucose-Capillary: 135 mg/dL — ABNORMAL HIGH (ref 70–99)
Glucose-Capillary: 136 mg/dL — ABNORMAL HIGH (ref 70–99)
Glucose-Capillary: 137 mg/dL — ABNORMAL HIGH (ref 70–99)
Glucose-Capillary: 137 mg/dL — ABNORMAL HIGH (ref 70–99)
Glucose-Capillary: 137 mg/dL — ABNORMAL HIGH (ref 70–99)
Glucose-Capillary: 138 mg/dL — ABNORMAL HIGH (ref 70–99)
Glucose-Capillary: 138 mg/dL — ABNORMAL HIGH (ref 70–99)
Glucose-Capillary: 138 mg/dL — ABNORMAL HIGH (ref 70–99)
Glucose-Capillary: 138 mg/dL — ABNORMAL HIGH (ref 70–99)
Glucose-Capillary: 139 mg/dL — ABNORMAL HIGH (ref 70–99)
Glucose-Capillary: 140 mg/dL — ABNORMAL HIGH (ref 70–99)
Glucose-Capillary: 140 mg/dL — ABNORMAL HIGH (ref 70–99)
Glucose-Capillary: 141 mg/dL — ABNORMAL HIGH (ref 70–99)
Glucose-Capillary: 141 mg/dL — ABNORMAL HIGH (ref 70–99)
Glucose-Capillary: 141 mg/dL — ABNORMAL HIGH (ref 70–99)
Glucose-Capillary: 141 mg/dL — ABNORMAL HIGH (ref 70–99)
Glucose-Capillary: 143 mg/dL — ABNORMAL HIGH (ref 70–99)
Glucose-Capillary: 144 mg/dL — ABNORMAL HIGH (ref 70–99)
Glucose-Capillary: 144 mg/dL — ABNORMAL HIGH (ref 70–99)
Glucose-Capillary: 145 mg/dL — ABNORMAL HIGH (ref 70–99)
Glucose-Capillary: 145 mg/dL — ABNORMAL HIGH (ref 70–99)
Glucose-Capillary: 145 mg/dL — ABNORMAL HIGH (ref 70–99)
Glucose-Capillary: 145 mg/dL — ABNORMAL HIGH (ref 70–99)
Glucose-Capillary: 146 mg/dL — ABNORMAL HIGH (ref 70–99)
Glucose-Capillary: 146 mg/dL — ABNORMAL HIGH (ref 70–99)
Glucose-Capillary: 147 mg/dL — ABNORMAL HIGH (ref 70–99)
Glucose-Capillary: 147 mg/dL — ABNORMAL HIGH (ref 70–99)
Glucose-Capillary: 148 mg/dL — ABNORMAL HIGH (ref 70–99)
Glucose-Capillary: 148 mg/dL — ABNORMAL HIGH (ref 70–99)
Glucose-Capillary: 150 mg/dL — ABNORMAL HIGH (ref 70–99)
Glucose-Capillary: 151 mg/dL — ABNORMAL HIGH (ref 70–99)
Glucose-Capillary: 151 mg/dL — ABNORMAL HIGH (ref 70–99)
Glucose-Capillary: 151 mg/dL — ABNORMAL HIGH (ref 70–99)
Glucose-Capillary: 153 mg/dL — ABNORMAL HIGH (ref 70–99)
Glucose-Capillary: 154 mg/dL — ABNORMAL HIGH (ref 70–99)
Glucose-Capillary: 158 mg/dL — ABNORMAL HIGH (ref 70–99)
Glucose-Capillary: 158 mg/dL — ABNORMAL HIGH (ref 70–99)
Glucose-Capillary: 165 mg/dL — ABNORMAL HIGH (ref 70–99)
Glucose-Capillary: 166 mg/dL — ABNORMAL HIGH (ref 70–99)
Glucose-Capillary: 176 mg/dL — ABNORMAL HIGH (ref 70–99)
Glucose-Capillary: 176 mg/dL — ABNORMAL HIGH (ref 70–99)
Glucose-Capillary: 177 mg/dL — ABNORMAL HIGH (ref 70–99)
Glucose-Capillary: 177 mg/dL — ABNORMAL HIGH (ref 70–99)
Glucose-Capillary: 189 mg/dL — ABNORMAL HIGH (ref 70–99)
Glucose-Capillary: 191 mg/dL — ABNORMAL HIGH (ref 70–99)
Glucose-Capillary: 192 mg/dL — ABNORMAL HIGH (ref 70–99)
Glucose-Capillary: 192 mg/dL — ABNORMAL HIGH (ref 70–99)
Glucose-Capillary: 196 mg/dL — ABNORMAL HIGH (ref 70–99)
Glucose-Capillary: 200 mg/dL — ABNORMAL HIGH (ref 70–99)
Glucose-Capillary: 221 mg/dL — ABNORMAL HIGH (ref 70–99)
Glucose-Capillary: 223 mg/dL — ABNORMAL HIGH (ref 70–99)
Glucose-Capillary: 230 mg/dL — ABNORMAL HIGH (ref 70–99)
Glucose-Capillary: 261 mg/dL — ABNORMAL HIGH (ref 70–99)
Glucose-Capillary: 264 mg/dL — ABNORMAL HIGH (ref 70–99)
Glucose-Capillary: 279 mg/dL — ABNORMAL HIGH (ref 70–99)
Glucose-Capillary: 346 mg/dL — ABNORMAL HIGH (ref 70–99)
Glucose-Capillary: 98 mg/dL (ref 70–99)

## 2011-01-06 LAB — HEPARIN LEVEL (UNFRACTIONATED)
Heparin Unfractionated: 0.1 IU/mL — ABNORMAL LOW (ref 0.30–0.70)
Heparin Unfractionated: 0.23 IU/mL — ABNORMAL LOW (ref 0.30–0.70)
Heparin Unfractionated: 0.35 IU/mL (ref 0.30–0.70)
Heparin Unfractionated: 0.41 IU/mL (ref 0.30–0.70)
Heparin Unfractionated: 0.41 IU/mL (ref 0.30–0.70)
Heparin Unfractionated: 0.49 IU/mL (ref 0.30–0.70)
Heparin Unfractionated: 0.51 IU/mL (ref 0.30–0.70)
Heparin Unfractionated: 0.53 IU/mL (ref 0.30–0.70)
Heparin Unfractionated: 0.56 IU/mL (ref 0.30–0.70)
Heparin Unfractionated: 0.69 IU/mL (ref 0.30–0.70)
Heparin Unfractionated: 0.74 IU/mL — ABNORMAL HIGH (ref 0.30–0.70)
Heparin Unfractionated: 0.75 IU/mL — ABNORMAL HIGH (ref 0.30–0.70)
Heparin Unfractionated: 0.75 IU/mL — ABNORMAL HIGH (ref 0.30–0.70)
Heparin Unfractionated: 0.79 IU/mL — ABNORMAL HIGH (ref 0.30–0.70)
Heparin Unfractionated: 0.87 IU/mL — ABNORMAL HIGH (ref 0.30–0.70)
Heparin Unfractionated: 0.89 IU/mL — ABNORMAL HIGH (ref 0.30–0.70)
Heparin Unfractionated: 0.98 IU/mL — ABNORMAL HIGH (ref 0.30–0.70)
Heparin Unfractionated: <0.1 IU/mL — ABNORMAL LOW (ref 0.30–0.70)
Heparin Unfractionated: <0.1 IU/mL — ABNORMAL LOW (ref 0.30–0.70)
Heparin Unfractionated: <0.1 IU/mL — ABNORMAL LOW (ref 0.30–0.70)

## 2011-01-06 LAB — CROSSMATCH
ABO/RH(D): A POS
ABO/RH(D): A POS
Antibody Screen: NEGATIVE
Antibody Screen: NEGATIVE

## 2011-01-06 LAB — BLOOD GAS, ARTERIAL
Acid-base deficit: 0.8 mmol/L (ref 0.0–2.0)
Bicarbonate: 23.1 mEq/L (ref 20.0–24.0)
FIO2: 0.4 %
MECHVT: 450 mL
O2 Saturation: 97.7 %
PEEP: 5 cmH2O
Patient temperature: 100.1
RATE: 14 resp/min
TCO2: 24.3 mmol/L (ref 0–100)
pCO2 arterial: 38 mmHg (ref 35.0–45.0)
pH, Arterial: 7.406 — ABNORMAL HIGH (ref 7.350–7.400)
pO2, Arterial: 87 mmHg (ref 80.0–100.0)

## 2011-01-06 LAB — COMPREHENSIVE METABOLIC PANEL
ALT: 123 U/L — ABNORMAL HIGH (ref 0–35)
ALT: 160 U/L — ABNORMAL HIGH (ref 0–35)
ALT: 170 U/L — ABNORMAL HIGH (ref 0–35)
ALT: 255 U/L — ABNORMAL HIGH (ref 0–35)
ALT: 45 U/L — ABNORMAL HIGH (ref 0–35)
AST: 101 U/L — ABNORMAL HIGH (ref 0–37)
AST: 152 U/L — ABNORMAL HIGH (ref 0–37)
AST: 224 U/L — ABNORMAL HIGH (ref 0–37)
AST: 27 U/L (ref 0–37)
AST: 53 U/L — ABNORMAL HIGH (ref 0–37)
Albumin: 2.4 g/dL — ABNORMAL LOW (ref 3.5–5.2)
Albumin: 2.5 g/dL — ABNORMAL LOW (ref 3.5–5.2)
Albumin: 2.6 g/dL — ABNORMAL LOW (ref 3.5–5.2)
Albumin: 2.7 g/dL — ABNORMAL LOW (ref 3.5–5.2)
Albumin: 3 g/dL — ABNORMAL LOW (ref 3.5–5.2)
Alkaline Phosphatase: 55 U/L (ref 39–117)
Alkaline Phosphatase: 56 U/L (ref 39–117)
Alkaline Phosphatase: 66 U/L (ref 39–117)
Alkaline Phosphatase: 69 U/L (ref 39–117)
Alkaline Phosphatase: 79 U/L (ref 39–117)
BUN: 18 mg/dL (ref 6–23)
BUN: 23 mg/dL (ref 6–23)
BUN: 35 mg/dL — ABNORMAL HIGH (ref 6–23)
BUN: 45 mg/dL — ABNORMAL HIGH (ref 6–23)
BUN: 52 mg/dL — ABNORMAL HIGH (ref 6–23)
CO2: 16 mEq/L — ABNORMAL LOW (ref 19–32)
CO2: 18 mEq/L — ABNORMAL LOW (ref 19–32)
CO2: 20 mEq/L (ref 19–32)
CO2: 20 mEq/L (ref 19–32)
CO2: 24 mEq/L (ref 19–32)
Calcium: 7.2 mg/dL — ABNORMAL LOW (ref 8.4–10.5)
Calcium: 7.3 mg/dL — ABNORMAL LOW (ref 8.4–10.5)
Calcium: 7.4 mg/dL — ABNORMAL LOW (ref 8.4–10.5)
Calcium: 8.3 mg/dL — ABNORMAL LOW (ref 8.4–10.5)
Calcium: 9.2 mg/dL (ref 8.4–10.5)
Chloride: 105 mEq/L (ref 96–112)
Chloride: 111 mEq/L (ref 96–112)
Chloride: 111 mEq/L (ref 96–112)
Chloride: 114 mEq/L — ABNORMAL HIGH (ref 96–112)
Chloride: 116 mEq/L — ABNORMAL HIGH (ref 96–112)
Creatinine, Ser: 1.57 mg/dL — ABNORMAL HIGH (ref 0.4–1.2)
Creatinine, Ser: 1.59 mg/dL — ABNORMAL HIGH (ref 0.4–1.2)
Creatinine, Ser: 1.79 mg/dL — ABNORMAL HIGH (ref 0.4–1.2)
Creatinine, Ser: 1.79 mg/dL — ABNORMAL HIGH (ref 0.4–1.2)
Creatinine, Ser: 1.86 mg/dL — ABNORMAL HIGH (ref 0.4–1.2)
GFR calc Af Amer: 32 mL/min — ABNORMAL LOW (ref >60)
GFR calc Af Amer: 33 mL/min — ABNORMAL LOW (ref >60)
GFR calc Af Amer: 33 mL/min — ABNORMAL LOW (ref >60)
GFR calc Af Amer: 38 mL/min — ABNORMAL LOW (ref >60)
GFR calc Af Amer: 39 mL/min — ABNORMAL LOW (ref >60)
GFR calc non Af Amer: 26 mL/min — ABNORMAL LOW (ref >60)
GFR calc non Af Amer: 28 mL/min — ABNORMAL LOW (ref >60)
GFR calc non Af Amer: 28 mL/min — ABNORMAL LOW (ref >60)
GFR calc non Af Amer: 32 mL/min — ABNORMAL LOW (ref >60)
GFR calc non Af Amer: 32 mL/min — ABNORMAL LOW (ref >60)
Glucose, Bld: 112 mg/dL — ABNORMAL HIGH (ref 70–99)
Glucose, Bld: 162 mg/dL — ABNORMAL HIGH (ref 70–99)
Glucose, Bld: 166 mg/dL — ABNORMAL HIGH (ref 70–99)
Glucose, Bld: 189 mg/dL — ABNORMAL HIGH (ref 70–99)
Glucose, Bld: 379 mg/dL — ABNORMAL HIGH (ref 70–99)
Potassium: 3.4 mEq/L — ABNORMAL LOW (ref 3.5–5.1)
Potassium: 3.5 mEq/L (ref 3.5–5.1)
Potassium: 4 mEq/L (ref 3.5–5.1)
Potassium: 4.1 mEq/L (ref 3.5–5.1)
Potassium: 4.6 mEq/L (ref 3.5–5.1)
Sodium: 139 mEq/L (ref 135–145)
Sodium: 139 mEq/L (ref 135–145)
Sodium: 140 mEq/L (ref 135–145)
Sodium: 141 mEq/L (ref 135–145)
Sodium: 143 mEq/L (ref 135–145)
Total Bilirubin: 0.4 mg/dL (ref 0.3–1.2)
Total Bilirubin: 0.4 mg/dL (ref 0.3–1.2)
Total Bilirubin: 0.5 mg/dL (ref 0.3–1.2)
Total Bilirubin: 0.6 mg/dL (ref 0.3–1.2)
Total Bilirubin: 1.6 mg/dL — ABNORMAL HIGH (ref 0.3–1.2)
Total Protein: 5 g/dL — ABNORMAL LOW (ref 6.0–8.3)
Total Protein: 5 g/dL — ABNORMAL LOW (ref 6.0–8.3)
Total Protein: 5.3 g/dL — ABNORMAL LOW (ref 6.0–8.3)
Total Protein: 5.5 g/dL — ABNORMAL LOW (ref 6.0–8.3)
Total Protein: 6.2 g/dL (ref 6.0–8.3)

## 2011-01-06 LAB — CARDIAC PANEL(CRET KIN+CKTOT+MB+TROPI)
CK, MB: 15.4 ng/mL (ref 0.3–4.0)
CK, MB: 46.3 ng/mL (ref 0.3–4.0)
CK, MB: 6.1 ng/mL (ref 0.3–4.0)
Relative Index: 1.1 (ref 0.0–2.5)
Relative Index: 2.4 (ref 0.0–2.5)
Relative Index: 6.2 — ABNORMAL HIGH (ref 0.0–2.5)
Total CK: 538 U/L — ABNORMAL HIGH (ref 7–177)
Total CK: 655 U/L — ABNORMAL HIGH (ref 7–177)
Total CK: 745 U/L — ABNORMAL HIGH (ref 7–177)
Troponin I: 0.73 ng/mL (ref 0.00–0.06)
Troponin I: 1.43 ng/mL (ref 0.00–0.06)
Troponin I: 3.37 ng/mL (ref 0.00–0.06)

## 2011-01-06 LAB — FUNGUS CULTURE W SMEAR: Fungal Smear: NONE SEEN

## 2011-01-06 LAB — CARBOXYHEMOGLOBIN
Carboxyhemoglobin: 0.6 % (ref 0.5–1.5)
Carboxyhemoglobin: 0.8 % (ref 0.5–1.5)
Carboxyhemoglobin: 0.8 % (ref 0.5–1.5)
Methemoglobin: 0.3 % (ref 0.0–1.5)
Methemoglobin: 0.3 % (ref 0.0–1.5)
Methemoglobin: 1.1 % (ref 0.0–1.5)
O2 Saturation: 55.5 %
O2 Saturation: 58.8 %
O2 Saturation: 63.3 %
Total hemoglobin: 11.3 g/dL — ABNORMAL LOW (ref 12.5–16.0)
Total hemoglobin: 11.8 g/dL — ABNORMAL LOW (ref 12.5–16.0)
Total hemoglobin: 9.7 g/dL — ABNORMAL LOW (ref 12.5–16.0)

## 2011-01-06 LAB — URINALYSIS, ROUTINE W REFLEX MICROSCOPIC
Bilirubin Urine: NEGATIVE
Bilirubin Urine: NEGATIVE
Glucose, UA: 250 mg/dL — AB
Glucose, UA: NEGATIVE mg/dL
Ketones, ur: 15 mg/dL — AB
Ketones, ur: NEGATIVE mg/dL
Nitrite: NEGATIVE
Nitrite: POSITIVE — AB
Protein, ur: >300 mg/dL — AB
Protein, ur: NEGATIVE mg/dL
Specific Gravity, Urine: 1.007 (ref 1.005–1.030)
Specific Gravity, Urine: 1.015 (ref 1.005–1.030)
Urobilinogen, UA: 0.2 mg/dL (ref 0.0–1.0)
Urobilinogen, UA: 1 mg/dL (ref 0.0–1.0)
pH: 5 (ref 5.0–8.0)
pH: 6.5 (ref 5.0–8.0)

## 2011-01-06 LAB — CORTISOL: Cortisol, Plasma: 30.4 ug/dL

## 2011-01-06 LAB — MAGNESIUM
Magnesium: 1.8 mg/dL (ref 1.5–2.5)
Magnesium: 2.3 mg/dL (ref 1.5–2.5)
Magnesium: 2.5 mg/dL (ref 1.5–2.5)

## 2011-01-06 LAB — CULTURE, BAL-QUANTITATIVE W GRAM STAIN

## 2011-01-06 LAB — PROTIME-INR
INR: 1.12 (ref 0.00–1.49)
INR: 1.21 (ref 0.00–1.49)
INR: 1.36 (ref 0.00–1.49)
INR: 1.39 (ref 0.00–1.49)
INR: 1.45 (ref 0.00–1.49)
INR: 1.58 — ABNORMAL HIGH (ref 0.00–1.49)
INR: 1.7 — ABNORMAL HIGH (ref 0.00–1.49)
INR: 1.8 — ABNORMAL HIGH (ref 0.00–1.49)
INR: 1.84 — ABNORMAL HIGH (ref 0.00–1.49)
INR: 1.92 — ABNORMAL HIGH (ref 0.00–1.49)
INR: 1.95 — ABNORMAL HIGH (ref 0.00–1.49)
Prothrombin Time: 14.3 seconds (ref 11.6–15.2)
Prothrombin Time: 15.2 seconds (ref 11.6–15.2)
Prothrombin Time: 16.7 seconds — ABNORMAL HIGH (ref 11.6–15.2)
Prothrombin Time: 16.9 seconds — ABNORMAL HIGH (ref 11.6–15.2)
Prothrombin Time: 17.5 seconds — ABNORMAL HIGH (ref 11.6–15.2)
Prothrombin Time: 18.7 seconds — ABNORMAL HIGH (ref 11.6–15.2)
Prothrombin Time: 19.8 seconds — ABNORMAL HIGH (ref 11.6–15.2)
Prothrombin Time: 20.7 seconds — ABNORMAL HIGH (ref 11.6–15.2)
Prothrombin Time: 21.1 seconds — ABNORMAL HIGH (ref 11.6–15.2)
Prothrombin Time: 21.8 seconds — ABNORMAL HIGH (ref 11.6–15.2)
Prothrombin Time: 22.1 seconds — ABNORMAL HIGH (ref 11.6–15.2)

## 2011-01-06 LAB — HEPATITIS PANEL, ACUTE
HCV Ab: NEGATIVE
Hep A IgM: NEGATIVE
Hep B C IgM: NEGATIVE
Hepatitis B Surface Ag: NEGATIVE

## 2011-01-06 LAB — URINE MICROSCOPIC-ADD ON

## 2011-01-06 LAB — CULTURE, BAL-QUANTITATIVE
Colony Count: 25000
Colony Count: NO GROWTH
Culture: NO GROWTH
Gram Stain: NONE SEEN

## 2011-01-06 LAB — IRON AND TIBC
Iron: 19 ug/dL — ABNORMAL LOW (ref 42–135)
Saturation Ratios: 8 % — ABNORMAL LOW (ref 20–55)
TIBC: 228 ug/dL — ABNORMAL LOW (ref 250–470)
UIBC: 209 ug/dL

## 2011-01-06 LAB — PNEUMOCYSTIS JIROVECI SMEAR BY DFA: Pneumocystis jiroveci Ag: NOT DETECTED

## 2011-01-06 LAB — DIFFERENTIAL
Basophils Absolute: 0.1 10*3/uL (ref 0.0–0.1)
Basophils Relative: 1 % (ref 0–1)
Eosinophils Absolute: 0 10*3/uL (ref 0.0–0.7)
Eosinophils Relative: 0 % (ref 0–5)
Lymphocytes Relative: 80 % — ABNORMAL HIGH (ref 12–46)
Lymphs Abs: 8.9 10*3/uL — ABNORMAL HIGH (ref 0.7–4.0)
Monocytes Absolute: 0.3 10*3/uL (ref 0.1–1.0)
Monocytes Relative: 3 % (ref 3–12)
Neutro Abs: 1.8 10*3/uL (ref 1.7–7.7)
Neutrophils Relative %: 16 % — ABNORMAL LOW (ref 43–77)

## 2011-01-06 LAB — POCT I-STAT, CHEM 8
BUN: 20 mg/dL (ref 6–23)
Calcium, Ion: 1.21 mmol/L (ref 1.12–1.32)
Chloride: 107 mEq/L (ref 96–112)
Creatinine, Ser: 1.4 mg/dL — ABNORMAL HIGH (ref 0.4–1.2)
Glucose, Bld: 357 mg/dL — ABNORMAL HIGH (ref 70–99)
HCT: 37 % (ref 36.0–46.0)
Hemoglobin: 12.6 g/dL (ref 12.0–15.0)
Potassium: 4 mEq/L (ref 3.5–5.1)
Sodium: 138 mEq/L (ref 135–145)
TCO2: 18 mmol/L (ref 0–100)

## 2011-01-06 LAB — CULTURE, BLOOD (ROUTINE X 2)
Culture: NO GROWTH
Culture: NO GROWTH
Culture: NO GROWTH
Culture: NO GROWTH

## 2011-01-06 LAB — FOLATE: Folate: 7.5 ng/mL

## 2011-01-06 LAB — MRSA PCR SCREENING
MRSA by PCR: INVALID — AB
MRSA by PCR: NEGATIVE

## 2011-01-06 LAB — URINE CULTURE
Colony Count: NO GROWTH
Culture: NO GROWTH

## 2011-01-06 LAB — PATHOLOGIST SMEAR REVIEW

## 2011-01-06 LAB — LEGIONELLA ANTIGEN, URINE: Legionella Antigen, Urine: NEGATIVE

## 2011-01-06 LAB — D-DIMER, QUANTITATIVE (NOT AT ARMC)
D-Dimer, Quant: >20 ug/mL-FEU — ABNORMAL HIGH (ref 0.00–0.48)
D-Dimer, Quant: >20 ug/mL-FEU — ABNORMAL HIGH (ref 0.00–0.48)

## 2011-01-06 LAB — BODY FLUID CELL COUNT WITH DIFFERENTIAL
Lymphs, Fluid: 1 %
Monocyte-Macrophage-Serous Fluid: 9 % — ABNORMAL LOW (ref 50–90)
Neutrophil Count, Fluid: 90 % — ABNORMAL HIGH (ref 0–25)
Total Nucleated Cell Count, Fluid: 415 cu mm (ref 0–1000)

## 2011-01-06 LAB — AFB CULTURE WITH SMEAR (NOT AT ARMC): Acid Fast Smear: NONE SEEN

## 2011-01-06 LAB — HEMOCCULT GUIAC POC 1CARD (OFFICE): Fecal Occult Bld: POSITIVE

## 2011-01-06 LAB — POCT CARDIAC MARKERS
CKMB, poc: 1.3 ng/mL (ref 1.0–8.0)
Myoglobin, poc: 323 ng/mL (ref 12–200)
Troponin i, poc: 0.06 ng/mL (ref 0.00–0.09)

## 2011-01-06 LAB — STREP PNEUMONIAE URINARY ANTIGEN: Strep Pneumo Urinary Antigen: NEGATIVE

## 2011-01-06 LAB — LACTIC ACID, PLASMA
Lactic Acid, Venous: 15 mmol/L — ABNORMAL HIGH (ref 0.5–2.2)
Lactic Acid, Venous: 3.8 mmol/L — ABNORMAL HIGH (ref 0.5–2.2)
Lactic Acid, Venous: 3.9 mmol/L — ABNORMAL HIGH (ref 0.5–2.2)

## 2011-01-06 LAB — MRSA CULTURE

## 2011-01-06 LAB — PHOSPHORUS
Phosphorus: 2.7 mg/dL (ref 2.3–4.6)
Phosphorus: 2.9 mg/dL (ref 2.3–4.6)
Phosphorus: 3.3 mg/dL (ref 2.3–4.6)

## 2011-01-06 LAB — LEGIONELLA PROFILE(CULTURE+DFA/SMEAR): Legionella Antigen (DFA): NEGATIVE

## 2011-01-06 LAB — AMYLASE
Amylase: 41 U/L (ref 0–105)
Amylase: 92 U/L (ref 0–105)

## 2011-01-06 LAB — TYPE AND SCREEN
ABO/RH(D): A POS
Antibody Screen: NEGATIVE

## 2011-01-06 LAB — TSH: TSH: 1.192 u[IU]/mL (ref 0.350–4.500)

## 2011-01-06 LAB — APTT: aPTT: >200 seconds (ref 24–37)

## 2011-01-06 LAB — ABO/RH: ABO/RH(D): A POS

## 2011-01-06 LAB — RETICULOCYTES
RBC.: 2.32 MIL/uL — ABNORMAL LOW (ref 3.87–5.11)
Retic Count, Absolute: 67.3 10*3/uL (ref 19.0–186.0)
Retic Ct Pct: 2.9 % (ref 0.4–3.1)

## 2011-01-06 LAB — LIPASE, BLOOD
Lipase: 16 U/L (ref 11–59)
Lipase: 30 U/L (ref 11–59)

## 2011-01-06 LAB — VITAMIN B12: Vitamin B-12: 424 pg/mL (ref 211–911)

## 2011-01-06 LAB — FERRITIN: Ferritin: 52 ng/mL (ref 10–291)

## 2011-01-10 LAB — GLUCOSE, CAPILLARY
Glucose-Capillary: 102 mg/dL — ABNORMAL HIGH (ref 70–99)
Glucose-Capillary: 104 mg/dL — ABNORMAL HIGH (ref 70–99)
Glucose-Capillary: 104 mg/dL — ABNORMAL HIGH (ref 70–99)
Glucose-Capillary: 105 mg/dL — ABNORMAL HIGH (ref 70–99)
Glucose-Capillary: 106 mg/dL — ABNORMAL HIGH (ref 70–99)
Glucose-Capillary: 106 mg/dL — ABNORMAL HIGH (ref 70–99)
Glucose-Capillary: 106 mg/dL — ABNORMAL HIGH (ref 70–99)
Glucose-Capillary: 109 mg/dL — ABNORMAL HIGH (ref 70–99)
Glucose-Capillary: 110 mg/dL — ABNORMAL HIGH (ref 70–99)
Glucose-Capillary: 111 mg/dL — ABNORMAL HIGH (ref 70–99)
Glucose-Capillary: 113 mg/dL — ABNORMAL HIGH (ref 70–99)
Glucose-Capillary: 118 mg/dL — ABNORMAL HIGH (ref 70–99)
Glucose-Capillary: 119 mg/dL — ABNORMAL HIGH (ref 70–99)
Glucose-Capillary: 120 mg/dL — ABNORMAL HIGH (ref 70–99)
Glucose-Capillary: 122 mg/dL — ABNORMAL HIGH (ref 70–99)
Glucose-Capillary: 123 mg/dL — ABNORMAL HIGH (ref 70–99)
Glucose-Capillary: 125 mg/dL — ABNORMAL HIGH (ref 70–99)
Glucose-Capillary: 127 mg/dL — ABNORMAL HIGH (ref 70–99)
Glucose-Capillary: 127 mg/dL — ABNORMAL HIGH (ref 70–99)
Glucose-Capillary: 127 mg/dL — ABNORMAL HIGH (ref 70–99)
Glucose-Capillary: 127 mg/dL — ABNORMAL HIGH (ref 70–99)
Glucose-Capillary: 128 mg/dL — ABNORMAL HIGH (ref 70–99)
Glucose-Capillary: 128 mg/dL — ABNORMAL HIGH (ref 70–99)
Glucose-Capillary: 129 mg/dL — ABNORMAL HIGH (ref 70–99)
Glucose-Capillary: 129 mg/dL — ABNORMAL HIGH (ref 70–99)
Glucose-Capillary: 131 mg/dL — ABNORMAL HIGH (ref 70–99)
Glucose-Capillary: 131 mg/dL — ABNORMAL HIGH (ref 70–99)
Glucose-Capillary: 132 mg/dL — ABNORMAL HIGH (ref 70–99)
Glucose-Capillary: 132 mg/dL — ABNORMAL HIGH (ref 70–99)
Glucose-Capillary: 132 mg/dL — ABNORMAL HIGH (ref 70–99)
Glucose-Capillary: 132 mg/dL — ABNORMAL HIGH (ref 70–99)
Glucose-Capillary: 134 mg/dL — ABNORMAL HIGH (ref 70–99)
Glucose-Capillary: 134 mg/dL — ABNORMAL HIGH (ref 70–99)
Glucose-Capillary: 135 mg/dL — ABNORMAL HIGH (ref 70–99)
Glucose-Capillary: 135 mg/dL — ABNORMAL HIGH (ref 70–99)
Glucose-Capillary: 136 mg/dL — ABNORMAL HIGH (ref 70–99)
Glucose-Capillary: 136 mg/dL — ABNORMAL HIGH (ref 70–99)
Glucose-Capillary: 136 mg/dL — ABNORMAL HIGH (ref 70–99)
Glucose-Capillary: 139 mg/dL — ABNORMAL HIGH (ref 70–99)
Glucose-Capillary: 140 mg/dL — ABNORMAL HIGH (ref 70–99)
Glucose-Capillary: 140 mg/dL — ABNORMAL HIGH (ref 70–99)
Glucose-Capillary: 140 mg/dL — ABNORMAL HIGH (ref 70–99)
Glucose-Capillary: 141 mg/dL — ABNORMAL HIGH (ref 70–99)
Glucose-Capillary: 141 mg/dL — ABNORMAL HIGH (ref 70–99)
Glucose-Capillary: 142 mg/dL — ABNORMAL HIGH (ref 70–99)
Glucose-Capillary: 142 mg/dL — ABNORMAL HIGH (ref 70–99)
Glucose-Capillary: 145 mg/dL — ABNORMAL HIGH (ref 70–99)
Glucose-Capillary: 148 mg/dL — ABNORMAL HIGH (ref 70–99)
Glucose-Capillary: 149 mg/dL — ABNORMAL HIGH (ref 70–99)
Glucose-Capillary: 149 mg/dL — ABNORMAL HIGH (ref 70–99)
Glucose-Capillary: 150 mg/dL — ABNORMAL HIGH (ref 70–99)
Glucose-Capillary: 150 mg/dL — ABNORMAL HIGH (ref 70–99)
Glucose-Capillary: 152 mg/dL — ABNORMAL HIGH (ref 70–99)
Glucose-Capillary: 152 mg/dL — ABNORMAL HIGH (ref 70–99)
Glucose-Capillary: 155 mg/dL — ABNORMAL HIGH (ref 70–99)
Glucose-Capillary: 155 mg/dL — ABNORMAL HIGH (ref 70–99)
Glucose-Capillary: 158 mg/dL — ABNORMAL HIGH (ref 70–99)
Glucose-Capillary: 158 mg/dL — ABNORMAL HIGH (ref 70–99)
Glucose-Capillary: 159 mg/dL — ABNORMAL HIGH (ref 70–99)
Glucose-Capillary: 160 mg/dL — ABNORMAL HIGH (ref 70–99)
Glucose-Capillary: 160 mg/dL — ABNORMAL HIGH (ref 70–99)
Glucose-Capillary: 161 mg/dL — ABNORMAL HIGH (ref 70–99)
Glucose-Capillary: 161 mg/dL — ABNORMAL HIGH (ref 70–99)
Glucose-Capillary: 163 mg/dL — ABNORMAL HIGH (ref 70–99)
Glucose-Capillary: 164 mg/dL — ABNORMAL HIGH (ref 70–99)
Glucose-Capillary: 164 mg/dL — ABNORMAL HIGH (ref 70–99)
Glucose-Capillary: 165 mg/dL — ABNORMAL HIGH (ref 70–99)
Glucose-Capillary: 165 mg/dL — ABNORMAL HIGH (ref 70–99)
Glucose-Capillary: 171 mg/dL — ABNORMAL HIGH (ref 70–99)
Glucose-Capillary: 180 mg/dL — ABNORMAL HIGH (ref 70–99)
Glucose-Capillary: 198 mg/dL — ABNORMAL HIGH (ref 70–99)
Glucose-Capillary: 200 mg/dL — ABNORMAL HIGH (ref 70–99)
Glucose-Capillary: 239 mg/dL — ABNORMAL HIGH (ref 70–99)
Glucose-Capillary: 90 mg/dL (ref 70–99)
Glucose-Capillary: 97 mg/dL (ref 70–99)
Glucose-Capillary: 98 mg/dL (ref 70–99)
Glucose-Capillary: 99 mg/dL (ref 70–99)

## 2011-01-10 LAB — CBC
HCT: 20 % — ABNORMAL LOW (ref 36.0–46.0)
HCT: 22 % — ABNORMAL LOW (ref 36.0–46.0)
HCT: 22.9 % — ABNORMAL LOW (ref 36.0–46.0)
HCT: 23 % — ABNORMAL LOW (ref 36.0–46.0)
HCT: 23.5 % — ABNORMAL LOW (ref 36.0–46.0)
HCT: 23.7 % — ABNORMAL LOW (ref 36.0–46.0)
HCT: 24.2 % — ABNORMAL LOW (ref 36.0–46.0)
HCT: 24.5 % — ABNORMAL LOW (ref 36.0–46.0)
HCT: 25 % — ABNORMAL LOW (ref 36.0–46.0)
HCT: 25.2 % — ABNORMAL LOW (ref 36.0–46.0)
HCT: 25.3 % — ABNORMAL LOW (ref 36.0–46.0)
HCT: 25.7 % — ABNORMAL LOW (ref 36.0–46.0)
HCT: 26 % — ABNORMAL LOW (ref 36.0–46.0)
HCT: 26.3 % — ABNORMAL LOW (ref 36.0–46.0)
HCT: 26.5 % — ABNORMAL LOW (ref 36.0–46.0)
HCT: 26.7 % — ABNORMAL LOW (ref 36.0–46.0)
HCT: 26.8 % — ABNORMAL LOW (ref 36.0–46.0)
HCT: 29 % — ABNORMAL LOW (ref 36.0–46.0)
Hemoglobin: 6.9 g/dL — CL (ref 12.0–15.0)
Hemoglobin: 7.6 g/dL — ABNORMAL LOW (ref 12.0–15.0)
Hemoglobin: 7.7 g/dL — ABNORMAL LOW (ref 12.0–15.0)
Hemoglobin: 7.7 g/dL — ABNORMAL LOW (ref 12.0–15.0)
Hemoglobin: 7.7 g/dL — ABNORMAL LOW (ref 12.0–15.0)
Hemoglobin: 7.8 g/dL — ABNORMAL LOW (ref 12.0–15.0)
Hemoglobin: 8.2 g/dL — ABNORMAL LOW (ref 12.0–15.0)
Hemoglobin: 8.3 g/dL — ABNORMAL LOW (ref 12.0–15.0)
Hemoglobin: 8.4 g/dL — ABNORMAL LOW (ref 12.0–15.0)
Hemoglobin: 8.5 g/dL — ABNORMAL LOW (ref 12.0–15.0)
Hemoglobin: 8.5 g/dL — ABNORMAL LOW (ref 12.0–15.0)
Hemoglobin: 8.6 g/dL — ABNORMAL LOW (ref 12.0–15.0)
Hemoglobin: 8.7 g/dL — ABNORMAL LOW (ref 12.0–15.0)
Hemoglobin: 8.8 g/dL — ABNORMAL LOW (ref 12.0–15.0)
Hemoglobin: 8.8 g/dL — ABNORMAL LOW (ref 12.0–15.0)
Hemoglobin: 8.8 g/dL — ABNORMAL LOW (ref 12.0–15.0)
Hemoglobin: 9 g/dL — ABNORMAL LOW (ref 12.0–15.0)
Hemoglobin: 9.6 g/dL — ABNORMAL LOW (ref 12.0–15.0)
MCHC: 32.2 g/dL (ref 30.0–36.0)
MCHC: 32.8 g/dL (ref 30.0–36.0)
MCHC: 33.1 g/dL (ref 30.0–36.0)
MCHC: 33.2 g/dL (ref 30.0–36.0)
MCHC: 33.2 g/dL (ref 30.0–36.0)
MCHC: 33.3 g/dL (ref 30.0–36.0)
MCHC: 33.4 g/dL (ref 30.0–36.0)
MCHC: 33.5 g/dL (ref 30.0–36.0)
MCHC: 33.5 g/dL (ref 30.0–36.0)
MCHC: 33.5 g/dL (ref 30.0–36.0)
MCHC: 33.5 g/dL (ref 30.0–36.0)
MCHC: 33.5 g/dL (ref 30.0–36.0)
MCHC: 33.7 g/dL (ref 30.0–36.0)
MCHC: 33.7 g/dL (ref 30.0–36.0)
MCHC: 33.8 g/dL (ref 30.0–36.0)
MCHC: 34.2 g/dL (ref 30.0–36.0)
MCHC: 34.3 g/dL (ref 30.0–36.0)
MCHC: 35.1 g/dL (ref 30.0–36.0)
MCV: 87.7 fL (ref 78.0–100.0)
MCV: 88.6 fL (ref 78.0–100.0)
MCV: 88.8 fL (ref 78.0–100.0)
MCV: 88.8 fL (ref 78.0–100.0)
MCV: 89.4 fL (ref 78.0–100.0)
MCV: 89.4 fL (ref 78.0–100.0)
MCV: 89.5 fL (ref 78.0–100.0)
MCV: 89.6 fL (ref 78.0–100.0)
MCV: 89.7 fL (ref 78.0–100.0)
MCV: 89.7 fL (ref 78.0–100.0)
MCV: 89.8 fL (ref 78.0–100.0)
MCV: 90 fL (ref 78.0–100.0)
MCV: 90 fL (ref 78.0–100.0)
MCV: 90.2 fL (ref 78.0–100.0)
MCV: 91.4 fL (ref 78.0–100.0)
MCV: 91.8 fL (ref 78.0–100.0)
MCV: 91.9 fL (ref 78.0–100.0)
MCV: 92.2 fL (ref 78.0–100.0)
Platelets: 102 10*3/uL — ABNORMAL LOW (ref 150–400)
Platelets: 103 10*3/uL — ABNORMAL LOW (ref 150–400)
Platelets: 109 10*3/uL — ABNORMAL LOW (ref 150–400)
Platelets: 110 10*3/uL — ABNORMAL LOW (ref 150–400)
Platelets: 117 10*3/uL — ABNORMAL LOW (ref 150–400)
Platelets: 117 10*3/uL — ABNORMAL LOW (ref 150–400)
Platelets: 138 10*3/uL — ABNORMAL LOW (ref 150–400)
Platelets: 152 10*3/uL (ref 150–400)
Platelets: 174 10*3/uL (ref 150–400)
Platelets: 177 10*3/uL (ref 150–400)
Platelets: 192 10*3/uL (ref 150–400)
Platelets: 207 10*3/uL (ref 150–400)
Platelets: 213 10*3/uL (ref 150–400)
Platelets: 215 10*3/uL (ref 150–400)
Platelets: 231 10*3/uL (ref 150–400)
Platelets: 239 10*3/uL (ref 150–400)
Platelets: 283 10*3/uL (ref 150–400)
Platelets: 94 10*3/uL — ABNORMAL LOW (ref 150–400)
RBC: 2.18 MIL/uL — ABNORMAL LOW (ref 3.87–5.11)
RBC: 2.39 MIL/uL — ABNORMAL LOW (ref 3.87–5.11)
RBC: 2.55 MIL/uL — ABNORMAL LOW (ref 3.87–5.11)
RBC: 2.56 MIL/uL — ABNORMAL LOW (ref 3.87–5.11)
RBC: 2.58 MIL/uL — ABNORMAL LOW (ref 3.87–5.11)
RBC: 2.65 MIL/uL — ABNORMAL LOW (ref 3.87–5.11)
RBC: 2.65 MIL/uL — ABNORMAL LOW (ref 3.87–5.11)
RBC: 2.73 MIL/uL — ABNORMAL LOW (ref 3.87–5.11)
RBC: 2.78 MIL/uL — ABNORMAL LOW (ref 3.87–5.11)
RBC: 2.82 MIL/uL — ABNORMAL LOW (ref 3.87–5.11)
RBC: 2.87 MIL/uL — ABNORMAL LOW (ref 3.87–5.11)
RBC: 2.88 MIL/uL — ABNORMAL LOW (ref 3.87–5.11)
RBC: 2.91 MIL/uL — ABNORMAL LOW (ref 3.87–5.11)
RBC: 2.97 MIL/uL — ABNORMAL LOW (ref 3.87–5.11)
RBC: 2.97 MIL/uL — ABNORMAL LOW (ref 3.87–5.11)
RBC: 2.98 MIL/uL — ABNORMAL LOW (ref 3.87–5.11)
RBC: 3.01 MIL/uL — ABNORMAL LOW (ref 3.87–5.11)
RBC: 3.22 MIL/uL — ABNORMAL LOW (ref 3.87–5.11)
RDW: 17.3 % — ABNORMAL HIGH (ref 11.5–15.5)
RDW: 17.8 % — ABNORMAL HIGH (ref 11.5–15.5)
RDW: 17.9 % — ABNORMAL HIGH (ref 11.5–15.5)
RDW: 18.1 % — ABNORMAL HIGH (ref 11.5–15.5)
RDW: 18.9 % — ABNORMAL HIGH (ref 11.5–15.5)
RDW: 19 % — ABNORMAL HIGH (ref 11.5–15.5)
RDW: 19 % — ABNORMAL HIGH (ref 11.5–15.5)
RDW: 19 % — ABNORMAL HIGH (ref 11.5–15.5)
RDW: 19.2 % — ABNORMAL HIGH (ref 11.5–15.5)
RDW: 19.3 % — ABNORMAL HIGH (ref 11.5–15.5)
RDW: 19.4 % — ABNORMAL HIGH (ref 11.5–15.5)
RDW: 19.7 % — ABNORMAL HIGH (ref 11.5–15.5)
RDW: 19.8 % — ABNORMAL HIGH (ref 11.5–15.5)
RDW: 19.9 % — ABNORMAL HIGH (ref 11.5–15.5)
RDW: 20.2 % — ABNORMAL HIGH (ref 11.5–15.5)
RDW: 20.2 % — ABNORMAL HIGH (ref 11.5–15.5)
RDW: 20.2 % — ABNORMAL HIGH (ref 11.5–15.5)
RDW: 20.6 % — ABNORMAL HIGH (ref 11.5–15.5)
WBC: 5.6 10*3/uL (ref 4.0–10.5)
WBC: 5.7 10*3/uL (ref 4.0–10.5)
WBC: 6 10*3/uL (ref 4.0–10.5)
WBC: 6.3 10*3/uL (ref 4.0–10.5)
WBC: 6.3 10*3/uL (ref 4.0–10.5)
WBC: 6.8 10*3/uL (ref 4.0–10.5)
WBC: 6.8 10*3/uL (ref 4.0–10.5)
WBC: 6.9 10*3/uL (ref 4.0–10.5)
WBC: 7 10*3/uL (ref 4.0–10.5)
WBC: 7 10*3/uL (ref 4.0–10.5)
WBC: 7.4 10*3/uL (ref 4.0–10.5)
WBC: 7.4 10*3/uL (ref 4.0–10.5)
WBC: 7.5 10*3/uL (ref 4.0–10.5)
WBC: 7.8 10*3/uL (ref 4.0–10.5)
WBC: 8.4 10*3/uL (ref 4.0–10.5)
WBC: 8.4 10*3/uL (ref 4.0–10.5)
WBC: 8.9 10*3/uL (ref 4.0–10.5)
WBC: 9.9 10*3/uL (ref 4.0–10.5)

## 2011-01-10 LAB — BASIC METABOLIC PANEL
BUN: 13 mg/dL (ref 6–23)
BUN: 16 mg/dL (ref 6–23)
BUN: 18 mg/dL (ref 6–23)
BUN: 21 mg/dL (ref 6–23)
BUN: 24 mg/dL — ABNORMAL HIGH (ref 6–23)
BUN: 27 mg/dL — ABNORMAL HIGH (ref 6–23)
BUN: 29 mg/dL — ABNORMAL HIGH (ref 6–23)
BUN: 33 mg/dL — ABNORMAL HIGH (ref 6–23)
BUN: 8 mg/dL (ref 6–23)
CO2: 27 mEq/L (ref 19–32)
CO2: 29 mEq/L (ref 19–32)
CO2: 29 mEq/L (ref 19–32)
CO2: 29 mEq/L (ref 19–32)
CO2: 29 mEq/L (ref 19–32)
CO2: 29 mEq/L (ref 19–32)
CO2: 29 mEq/L (ref 19–32)
CO2: 29 mEq/L (ref 19–32)
CO2: 31 mEq/L (ref 19–32)
Calcium: 7.8 mg/dL — ABNORMAL LOW (ref 8.4–10.5)
Calcium: 7.8 mg/dL — ABNORMAL LOW (ref 8.4–10.5)
Calcium: 7.9 mg/dL — ABNORMAL LOW (ref 8.4–10.5)
Calcium: 7.9 mg/dL — ABNORMAL LOW (ref 8.4–10.5)
Calcium: 7.9 mg/dL — ABNORMAL LOW (ref 8.4–10.5)
Calcium: 8 mg/dL — ABNORMAL LOW (ref 8.4–10.5)
Calcium: 8 mg/dL — ABNORMAL LOW (ref 8.4–10.5)
Calcium: 8 mg/dL — ABNORMAL LOW (ref 8.4–10.5)
Calcium: 8 mg/dL — ABNORMAL LOW (ref 8.4–10.5)
Chloride: 102 mEq/L (ref 96–112)
Chloride: 103 mEq/L (ref 96–112)
Chloride: 104 mEq/L (ref 96–112)
Chloride: 105 mEq/L (ref 96–112)
Chloride: 112 mEq/L (ref 96–112)
Chloride: 113 mEq/L — ABNORMAL HIGH (ref 96–112)
Chloride: 118 mEq/L — ABNORMAL HIGH (ref 96–112)
Chloride: 97 mEq/L (ref 96–112)
Chloride: 98 mEq/L (ref 96–112)
Creatinine, Ser: 0.72 mg/dL (ref 0.4–1.2)
Creatinine, Ser: 0.72 mg/dL (ref 0.4–1.2)
Creatinine, Ser: 0.78 mg/dL (ref 0.4–1.2)
Creatinine, Ser: 0.8 mg/dL (ref 0.4–1.2)
Creatinine, Ser: 0.93 mg/dL (ref 0.4–1.2)
Creatinine, Ser: 0.94 mg/dL (ref 0.4–1.2)
Creatinine, Ser: 1.15 mg/dL (ref 0.4–1.2)
Creatinine, Ser: 1.31 mg/dL — ABNORMAL HIGH (ref 0.4–1.2)
Creatinine, Ser: 1.65 mg/dL — ABNORMAL HIGH (ref 0.4–1.2)
GFR calc Af Amer: 37 mL/min — ABNORMAL LOW (ref >60)
GFR calc Af Amer: 48 mL/min — ABNORMAL LOW (ref >60)
GFR calc Af Amer: 56 mL/min — ABNORMAL LOW (ref >60)
GFR calc Af Amer: >60 mL/min (ref >60)
GFR calc Af Amer: >60 mL/min (ref >60)
GFR calc Af Amer: >60 mL/min (ref >60)
GFR calc Af Amer: >60 mL/min (ref >60)
GFR calc Af Amer: >60 mL/min (ref >60)
GFR calc Af Amer: >60 mL/min (ref >60)
GFR calc non Af Amer: 30 mL/min — ABNORMAL LOW (ref >60)
GFR calc non Af Amer: 40 mL/min — ABNORMAL LOW (ref >60)
GFR calc non Af Amer: 46 mL/min — ABNORMAL LOW (ref >60)
GFR calc non Af Amer: 58 mL/min — ABNORMAL LOW (ref >60)
GFR calc non Af Amer: 59 mL/min — ABNORMAL LOW (ref >60)
GFR calc non Af Amer: >60 mL/min (ref >60)
GFR calc non Af Amer: >60 mL/min (ref >60)
GFR calc non Af Amer: >60 mL/min (ref >60)
GFR calc non Af Amer: >60 mL/min (ref >60)
Glucose, Bld: 109 mg/dL — ABNORMAL HIGH (ref 70–99)
Glucose, Bld: 111 mg/dL — ABNORMAL HIGH (ref 70–99)
Glucose, Bld: 115 mg/dL — ABNORMAL HIGH (ref 70–99)
Glucose, Bld: 138 mg/dL — ABNORMAL HIGH (ref 70–99)
Glucose, Bld: 151 mg/dL — ABNORMAL HIGH (ref 70–99)
Glucose, Bld: 152 mg/dL — ABNORMAL HIGH (ref 70–99)
Glucose, Bld: 162 mg/dL — ABNORMAL HIGH (ref 70–99)
Glucose, Bld: 162 mg/dL — ABNORMAL HIGH (ref 70–99)
Glucose, Bld: 98 mg/dL (ref 70–99)
Potassium: 3.4 mEq/L — ABNORMAL LOW (ref 3.5–5.1)
Potassium: 4 mEq/L (ref 3.5–5.1)
Potassium: 4 mEq/L (ref 3.5–5.1)
Potassium: 4.1 mEq/L (ref 3.5–5.1)
Potassium: 4.2 mEq/L (ref 3.5–5.1)
Potassium: 4.2 mEq/L (ref 3.5–5.1)
Potassium: 4.2 mEq/L (ref 3.5–5.1)
Potassium: 4.4 mEq/L (ref 3.5–5.1)
Potassium: 4.4 mEq/L (ref 3.5–5.1)
Sodium: 131 mEq/L — ABNORMAL LOW (ref 135–145)
Sodium: 133 mEq/L — ABNORMAL LOW (ref 135–145)
Sodium: 133 mEq/L — ABNORMAL LOW (ref 135–145)
Sodium: 136 mEq/L (ref 135–145)
Sodium: 136 mEq/L (ref 135–145)
Sodium: 140 mEq/L (ref 135–145)
Sodium: 145 mEq/L (ref 135–145)
Sodium: 145 mEq/L (ref 135–145)
Sodium: 151 mEq/L — ABNORMAL HIGH (ref 135–145)

## 2011-01-10 LAB — COMPREHENSIVE METABOLIC PANEL
ALT: 38 U/L — ABNORMAL HIGH (ref 0–35)
ALT: 38 U/L — ABNORMAL HIGH (ref 0–35)
ALT: 41 U/L — ABNORMAL HIGH (ref 0–35)
ALT: 42 U/L — ABNORMAL HIGH (ref 0–35)
AST: 25 U/L (ref 0–37)
AST: 27 U/L (ref 0–37)
AST: 33 U/L (ref 0–37)
AST: 42 U/L — ABNORMAL HIGH (ref 0–37)
Albumin: 1.5 g/dL — ABNORMAL LOW (ref 3.5–5.2)
Albumin: 1.6 g/dL — ABNORMAL LOW (ref 3.5–5.2)
Albumin: 1.8 g/dL — ABNORMAL LOW (ref 3.5–5.2)
Albumin: 1.8 g/dL — ABNORMAL LOW (ref 3.5–5.2)
Alkaline Phosphatase: 112 U/L (ref 39–117)
Alkaline Phosphatase: 113 U/L (ref 39–117)
Alkaline Phosphatase: 65 U/L (ref 39–117)
Alkaline Phosphatase: 75 U/L (ref 39–117)
BUN: 23 mg/dL (ref 6–23)
BUN: 24 mg/dL — ABNORMAL HIGH (ref 6–23)
BUN: 30 mg/dL — ABNORMAL HIGH (ref 6–23)
BUN: 31 mg/dL — ABNORMAL HIGH (ref 6–23)
CO2: 28 mEq/L (ref 19–32)
CO2: 30 mEq/L (ref 19–32)
CO2: 31 mEq/L (ref 19–32)
CO2: 35 mEq/L — ABNORMAL HIGH (ref 19–32)
Calcium: 7.8 mg/dL — ABNORMAL LOW (ref 8.4–10.5)
Calcium: 7.9 mg/dL — ABNORMAL LOW (ref 8.4–10.5)
Calcium: 7.9 mg/dL — ABNORMAL LOW (ref 8.4–10.5)
Calcium: 8.1 mg/dL — ABNORMAL LOW (ref 8.4–10.5)
Chloride: 102 mEq/L (ref 96–112)
Chloride: 112 mEq/L (ref 96–112)
Chloride: 115 mEq/L — ABNORMAL HIGH (ref 96–112)
Chloride: 98 mEq/L (ref 96–112)
Creatinine, Ser: 0.96 mg/dL (ref 0.4–1.2)
Creatinine, Ser: 1.04 mg/dL (ref 0.4–1.2)
Creatinine, Ser: 1.09 mg/dL (ref 0.4–1.2)
Creatinine, Ser: 1.52 mg/dL — ABNORMAL HIGH (ref 0.4–1.2)
GFR calc Af Amer: 40 mL/min — ABNORMAL LOW (ref >60)
GFR calc Af Amer: 59 mL/min — ABNORMAL LOW (ref >60)
GFR calc Af Amer: >60 mL/min (ref >60)
GFR calc Af Amer: >60 mL/min (ref >60)
GFR calc non Af Amer: 33 mL/min — ABNORMAL LOW (ref >60)
GFR calc non Af Amer: 49 mL/min — ABNORMAL LOW (ref >60)
GFR calc non Af Amer: 52 mL/min — ABNORMAL LOW (ref >60)
GFR calc non Af Amer: 57 mL/min — ABNORMAL LOW (ref >60)
Glucose, Bld: 126 mg/dL — ABNORMAL HIGH (ref 70–99)
Glucose, Bld: 155 mg/dL — ABNORMAL HIGH (ref 70–99)
Glucose, Bld: 158 mg/dL — ABNORMAL HIGH (ref 70–99)
Glucose, Bld: 184 mg/dL — ABNORMAL HIGH (ref 70–99)
Potassium: 3.8 mEq/L (ref 3.5–5.1)
Potassium: 3.9 mEq/L (ref 3.5–5.1)
Potassium: 4.2 mEq/L (ref 3.5–5.1)
Potassium: 4.2 mEq/L (ref 3.5–5.1)
Sodium: 137 mEq/L (ref 135–145)
Sodium: 141 mEq/L (ref 135–145)
Sodium: 145 mEq/L (ref 135–145)
Sodium: 146 mEq/L — ABNORMAL HIGH (ref 135–145)
Total Bilirubin: 0.4 mg/dL (ref 0.3–1.2)
Total Bilirubin: 0.6 mg/dL (ref 0.3–1.2)
Total Bilirubin: 0.6 mg/dL (ref 0.3–1.2)
Total Bilirubin: 0.8 mg/dL (ref 0.3–1.2)
Total Protein: 5.5 g/dL — ABNORMAL LOW (ref 6.0–8.3)
Total Protein: 5.5 g/dL — ABNORMAL LOW (ref 6.0–8.3)
Total Protein: 5.7 g/dL — ABNORMAL LOW (ref 6.0–8.3)
Total Protein: 5.9 g/dL — ABNORMAL LOW (ref 6.0–8.3)

## 2011-01-10 LAB — PROTIME-INR
INR: 0.99 (ref 0.00–1.49)
INR: 1.04 (ref 0.00–1.49)
INR: 1.05 (ref 0.00–1.49)
INR: 1.05 (ref 0.00–1.49)
INR: 1.06 (ref 0.00–1.49)
INR: 1.07 (ref 0.00–1.49)
INR: 1.08 (ref 0.00–1.49)
INR: 1.11 (ref 0.00–1.49)
INR: 1.12 (ref 0.00–1.49)
INR: 1.12 (ref 0.00–1.49)
INR: 1.13 (ref 0.00–1.49)
INR: 1.14 (ref 0.00–1.49)
INR: 1.15 (ref 0.00–1.49)
INR: 1.28 (ref 0.00–1.49)
INR: 1.29 (ref 0.00–1.49)
INR: 2.1 — ABNORMAL HIGH (ref 0.00–1.49)
INR: 2.15 — ABNORMAL HIGH (ref 0.00–1.49)
INR: 3.28 — ABNORMAL HIGH (ref 0.00–1.49)
Prothrombin Time: 13 seconds (ref 11.6–15.2)
Prothrombin Time: 13.5 seconds (ref 11.6–15.2)
Prothrombin Time: 13.6 seconds (ref 11.6–15.2)
Prothrombin Time: 13.6 seconds (ref 11.6–15.2)
Prothrombin Time: 13.7 seconds (ref 11.6–15.2)
Prothrombin Time: 13.8 seconds (ref 11.6–15.2)
Prothrombin Time: 13.9 seconds (ref 11.6–15.2)
Prothrombin Time: 14.2 seconds (ref 11.6–15.2)
Prothrombin Time: 14.3 seconds (ref 11.6–15.2)
Prothrombin Time: 14.3 seconds (ref 11.6–15.2)
Prothrombin Time: 14.4 seconds (ref 11.6–15.2)
Prothrombin Time: 14.5 seconds (ref 11.6–15.2)
Prothrombin Time: 14.6 seconds (ref 11.6–15.2)
Prothrombin Time: 15.9 seconds — ABNORMAL HIGH (ref 11.6–15.2)
Prothrombin Time: 16 seconds — ABNORMAL HIGH (ref 11.6–15.2)
Prothrombin Time: 23.4 seconds — ABNORMAL HIGH (ref 11.6–15.2)
Prothrombin Time: 23.8 seconds — ABNORMAL HIGH (ref 11.6–15.2)
Prothrombin Time: 33.1 seconds — ABNORMAL HIGH (ref 11.6–15.2)

## 2011-01-10 LAB — MAGNESIUM
Magnesium: 2 mg/dL (ref 1.5–2.5)
Magnesium: 2.1 mg/dL (ref 1.5–2.5)
Magnesium: 2.1 mg/dL (ref 1.5–2.5)

## 2011-01-10 LAB — BLOOD GAS, ARTERIAL
Acid-Base Excess: 3.1 mmol/L — ABNORMAL HIGH (ref 0.0–2.0)
Acid-Base Excess: 4.8 mmol/L — ABNORMAL HIGH (ref 0.0–2.0)
Bicarbonate: 26.4 mEq/L — ABNORMAL HIGH (ref 20.0–24.0)
Bicarbonate: 29 mEq/L — ABNORMAL HIGH (ref 20.0–24.0)
Drawn by: 244801
O2 Content: 2 L/min
O2 Content: 3 L/min
O2 Saturation: 94.3 %
O2 Saturation: 97.1 %
Patient temperature: 98.6
Patient temperature: 98.6
TCO2: 27.5 mmol/L (ref 0–100)
TCO2: 30.3 mmol/L (ref 0–100)
pCO2 arterial: 35.9 mmHg (ref 35.0–45.0)
pCO2 arterial: 44.6 mmHg (ref 35.0–45.0)
pH, Arterial: 7.428 — ABNORMAL HIGH (ref 7.350–7.400)
pH, Arterial: 7.48 — ABNORMAL HIGH (ref 7.350–7.400)
pO2, Arterial: 69 mmHg — ABNORMAL LOW (ref 80.0–100.0)
pO2, Arterial: 85.7 mmHg (ref 80.0–100.0)

## 2011-01-10 LAB — DIFFERENTIAL
Basophils Absolute: 0 10*3/uL (ref 0.0–0.1)
Basophils Relative: 0 % (ref 0–1)
Eosinophils Absolute: 0 10*3/uL (ref 0.0–0.7)
Eosinophils Relative: 0 % (ref 0–5)
Lymphocytes Relative: 7 % — ABNORMAL LOW (ref 12–46)
Lymphs Abs: 0.4 10*3/uL — ABNORMAL LOW (ref 0.7–4.0)
Monocytes Absolute: 0.4 10*3/uL (ref 0.1–1.0)
Monocytes Relative: 7 % (ref 3–12)
Neutro Abs: 4.9 10*3/uL (ref 1.7–7.7)
Neutrophils Relative %: 85 % — ABNORMAL HIGH (ref 43–77)

## 2011-01-10 LAB — CROSSMATCH
ABO/RH(D): A POS
Antibody Screen: NEGATIVE

## 2011-01-10 LAB — IRON AND TIBC
Iron: 23 ug/dL — ABNORMAL LOW (ref 42–135)
Saturation Ratios: 18 % — ABNORMAL LOW (ref 20–55)
TIBC: 127 ug/dL — ABNORMAL LOW (ref 250–470)
UIBC: 104 ug/dL

## 2011-01-10 LAB — VANCOMYCIN, TROUGH: Vancomycin Tr: 15.3 ug/mL (ref 10.0–20.0)

## 2011-01-10 LAB — RETICULOCYTES
RBC.: 2.68 MIL/uL — ABNORMAL LOW (ref 3.87–5.11)
Retic Count, Absolute: 101.8 10*3/uL (ref 19.0–186.0)
Retic Ct Pct: 3.8 % — ABNORMAL HIGH (ref 0.4–3.1)

## 2011-01-10 LAB — VITAMIN B12: Vitamin B-12: 522 pg/mL (ref 211–911)

## 2011-01-10 LAB — APTT: aPTT: 30 seconds (ref 24–37)

## 2011-01-10 LAB — FOLATE: Folate: 7.6 ng/mL

## 2011-01-10 LAB — CEA: CEA: 1 ng/mL (ref 0.0–5.0)

## 2011-01-10 LAB — PHOSPHORUS
Phosphorus: 3 mg/dL (ref 2.3–4.6)
Phosphorus: 3.1 mg/dL (ref 2.3–4.6)
Phosphorus: 3.4 mg/dL (ref 2.3–4.6)

## 2011-01-10 LAB — SEDIMENTATION RATE: Sed Rate: 115 mm/hr — ABNORMAL HIGH (ref 0–22)

## 2011-01-10 LAB — FERRITIN: Ferritin: 1589 ng/mL — ABNORMAL HIGH (ref 10–291)

## 2011-01-10 LAB — BRAIN NATRIURETIC PEPTIDE: Pro B Natriuretic peptide (BNP): 173 pg/mL — ABNORMAL HIGH (ref 0.0–100.0)

## 2011-01-10 LAB — HEPARIN LEVEL (UNFRACTIONATED)
Heparin Unfractionated: 0.15 IU/mL — ABNORMAL LOW (ref 0.30–0.70)
Heparin Unfractionated: 0.28 IU/mL — ABNORMAL LOW (ref 0.30–0.70)

## 2011-01-10 LAB — FACTOR 5 LEIDEN

## 2011-06-01 ENCOUNTER — Other Ambulatory Visit: Payer: Self-pay | Admitting: Internal Medicine

## 2011-06-01 DIAGNOSIS — Z1231 Encounter for screening mammogram for malignant neoplasm of breast: Secondary | ICD-10-CM

## 2011-06-16 ENCOUNTER — Ambulatory Visit
Admission: RE | Admit: 2011-06-16 | Discharge: 2011-06-16 | Disposition: A | Discharge status: Home / Self Care | Payer: Medicare Other | Source: Ambulatory Visit | Attending: Internal Medicine | Admitting: Internal Medicine

## 2011-06-16 DIAGNOSIS — Z1231 Encounter for screening mammogram for malignant neoplasm of breast: Secondary | ICD-10-CM

## 2012-07-05 ENCOUNTER — Other Ambulatory Visit: Payer: Self-pay | Admitting: Internal Medicine

## 2012-07-05 DIAGNOSIS — Z1231 Encounter for screening mammogram for malignant neoplasm of breast: Secondary | ICD-10-CM

## 2012-07-30 ENCOUNTER — Ambulatory Visit
Admission: RE | Admit: 2012-07-30 | Discharge: 2012-07-30 | Disposition: A | Discharge status: Home / Self Care | Payer: Medicare Other | Source: Ambulatory Visit | Attending: Internal Medicine | Admitting: Internal Medicine

## 2012-07-30 DIAGNOSIS — Z1231 Encounter for screening mammogram for malignant neoplasm of breast: Secondary | ICD-10-CM

## 2013-08-14 ENCOUNTER — Other Ambulatory Visit: Payer: Self-pay

## 2013-08-14 DIAGNOSIS — Z1231 Encounter for screening mammogram for malignant neoplasm of breast: Secondary | ICD-10-CM

## 2013-09-16 ENCOUNTER — Ambulatory Visit: Payer: Medicare Other

## 2013-10-15 ENCOUNTER — Ambulatory Visit
Admission: RE | Admit: 2013-10-15 | Discharge: 2013-10-15 | Disposition: A | Discharge status: Home / Self Care | Payer: Medicare Other | Source: Ambulatory Visit

## 2013-10-15 DIAGNOSIS — Z1231 Encounter for screening mammogram for malignant neoplasm of breast: Secondary | ICD-10-CM

## 2014-12-22 ENCOUNTER — Other Ambulatory Visit: Payer: Self-pay

## 2014-12-22 DIAGNOSIS — Z1231 Encounter for screening mammogram for malignant neoplasm of breast: Secondary | ICD-10-CM

## 2015-01-13 ENCOUNTER — Ambulatory Visit
Admission: RE | Admit: 2015-01-13 | Discharge: 2015-01-13 | Disposition: A | Discharge status: Home / Self Care | Payer: Medicare Other | Source: Ambulatory Visit

## 2015-01-13 DIAGNOSIS — Z1231 Encounter for screening mammogram for malignant neoplasm of breast: Secondary | ICD-10-CM

## 2015-11-02 DIAGNOSIS — Z7901 Long term (current) use of anticoagulants: Secondary | ICD-10-CM | POA: Diagnosis not present

## 2015-11-30 DIAGNOSIS — Z7901 Long term (current) use of anticoagulants: Secondary | ICD-10-CM | POA: Diagnosis not present

## 2015-12-28 DIAGNOSIS — Z7901 Long term (current) use of anticoagulants: Secondary | ICD-10-CM | POA: Diagnosis not present

## 2016-01-04 DIAGNOSIS — Z7901 Long term (current) use of anticoagulants: Secondary | ICD-10-CM | POA: Diagnosis not present

## 2016-01-12 DIAGNOSIS — Z7901 Long term (current) use of anticoagulants: Secondary | ICD-10-CM | POA: Diagnosis not present

## 2016-01-19 ENCOUNTER — Ambulatory Visit
Admission: RE | Admit: 2016-01-19 | Discharge: 2016-01-19 | Disposition: A | Discharge status: Home / Self Care | Payer: PPO | Source: Ambulatory Visit | Attending: Internal Medicine | Admitting: Internal Medicine

## 2016-01-19 ENCOUNTER — Other Ambulatory Visit: Payer: Self-pay | Admitting: Internal Medicine

## 2016-01-19 DIAGNOSIS — R059 Cough, unspecified: Secondary | ICD-10-CM

## 2016-01-19 DIAGNOSIS — R05 Cough: Secondary | ICD-10-CM

## 2016-01-19 DIAGNOSIS — J189 Pneumonia, unspecified organism: Secondary | ICD-10-CM | POA: Diagnosis not present

## 2016-01-26 DIAGNOSIS — Z7901 Long term (current) use of anticoagulants: Secondary | ICD-10-CM | POA: Diagnosis not present

## 2016-01-28 DIAGNOSIS — Z7901 Long term (current) use of anticoagulants: Secondary | ICD-10-CM | POA: Diagnosis not present

## 2016-02-04 ENCOUNTER — Other Ambulatory Visit: Payer: Self-pay

## 2016-02-04 DIAGNOSIS — Z1231 Encounter for screening mammogram for malignant neoplasm of breast: Secondary | ICD-10-CM

## 2016-02-11 DIAGNOSIS — Z7901 Long term (current) use of anticoagulants: Secondary | ICD-10-CM | POA: Diagnosis not present

## 2016-02-16 ENCOUNTER — Other Ambulatory Visit: Payer: Self-pay | Admitting: Internal Medicine

## 2016-02-16 ENCOUNTER — Ambulatory Visit
Admission: RE | Admit: 2016-02-16 | Discharge: 2016-02-16 | Disposition: A | Discharge status: Home / Self Care | Payer: PPO | Source: Ambulatory Visit | Attending: Internal Medicine | Admitting: Internal Medicine

## 2016-02-16 DIAGNOSIS — J189 Pneumonia, unspecified organism: Secondary | ICD-10-CM

## 2016-02-25 DIAGNOSIS — Z7901 Long term (current) use of anticoagulants: Secondary | ICD-10-CM | POA: Diagnosis not present

## 2016-02-26 ENCOUNTER — Ambulatory Visit
Admission: RE | Admit: 2016-02-26 | Discharge: 2016-02-26 | Disposition: A | Discharge status: Home / Self Care | Payer: PPO | Source: Ambulatory Visit

## 2016-02-26 DIAGNOSIS — Z1231 Encounter for screening mammogram for malignant neoplasm of breast: Secondary | ICD-10-CM | POA: Diagnosis not present

## 2016-03-02 DIAGNOSIS — E782 Mixed hyperlipidemia: Secondary | ICD-10-CM | POA: Diagnosis not present

## 2016-03-02 DIAGNOSIS — I2699 Other pulmonary embolism without acute cor pulmonale: Secondary | ICD-10-CM | POA: Diagnosis not present

## 2016-03-02 DIAGNOSIS — K219 Gastro-esophageal reflux disease without esophagitis: Secondary | ICD-10-CM | POA: Diagnosis not present

## 2016-03-02 DIAGNOSIS — I10 Essential (primary) hypertension: Secondary | ICD-10-CM | POA: Diagnosis not present

## 2016-03-02 DIAGNOSIS — R0609 Other forms of dyspnea: Secondary | ICD-10-CM | POA: Diagnosis not present

## 2016-03-02 DIAGNOSIS — I82402 Acute embolism and thrombosis of unspecified deep veins of left lower extremity: Secondary | ICD-10-CM | POA: Diagnosis not present

## 2016-03-02 DIAGNOSIS — N183 Chronic kidney disease, stage 3 (moderate): Secondary | ICD-10-CM | POA: Diagnosis not present

## 2016-03-17 DIAGNOSIS — Z7901 Long term (current) use of anticoagulants: Secondary | ICD-10-CM | POA: Diagnosis not present

## 2016-03-21 DIAGNOSIS — H353132 Nonexudative age-related macular degeneration, bilateral, intermediate dry stage: Secondary | ICD-10-CM | POA: Diagnosis not present

## 2016-03-21 DIAGNOSIS — H25813 Combined forms of age-related cataract, bilateral: Secondary | ICD-10-CM | POA: Diagnosis not present

## 2016-03-21 DIAGNOSIS — H35342 Macular cyst, hole, or pseudohole, left eye: Secondary | ICD-10-CM | POA: Diagnosis not present

## 2016-03-21 DIAGNOSIS — H35373 Puckering of macula, bilateral: Secondary | ICD-10-CM | POA: Diagnosis not present

## 2016-03-25 ENCOUNTER — Ambulatory Visit (INDEPENDENT_AMBULATORY_CARE_PROVIDER_SITE_OTHER): Payer: PPO | Admitting: Cardiovascular Disease

## 2016-03-25 ENCOUNTER — Encounter: Payer: Self-pay | Admitting: Cardiovascular Disease

## 2016-03-25 VITALS — BP 152/70 | HR 66 | Ht 62.0 in | Wt 208.4 lb

## 2016-03-25 DIAGNOSIS — R0602 Shortness of breath: Secondary | ICD-10-CM

## 2016-03-25 DIAGNOSIS — E785 Hyperlipidemia, unspecified: Secondary | ICD-10-CM | POA: Insufficient documentation

## 2016-03-25 DIAGNOSIS — R0609 Other forms of dyspnea: Secondary | ICD-10-CM | POA: Diagnosis not present

## 2016-03-25 DIAGNOSIS — R0600 Dyspnea, unspecified: Secondary | ICD-10-CM

## 2016-03-25 DIAGNOSIS — I10 Essential (primary) hypertension: Secondary | ICD-10-CM | POA: Insufficient documentation

## 2016-03-25 NOTE — Assessment & Plan Note (Signed)
History of hyperlipidemia on atorvastatin followed by her PCP 

## 2016-03-25 NOTE — Progress Notes (Signed)
03/25/2016 Achille Rich   [birth year]  [id number]  Primary Physician No primary care provider on file. Primary Cardiologist: Lorretta Harp MD Sara Rodriguez  HPI:  Mrs. Sluyter is a very pleasant 82 year old mildly overweight widowed Caucasian female mother of 5 children, grandmother and 2 grandchildren is accompanied by her daughter Clarene Critchley wall whose husband Skip Mayer also a patient of mine. Ms. Aguiar has a history of hypertension and hyperlipidemia but no other risk factors. She's never had a heart attack or stroke. She is complaining of increased dyspnea on exertion over the last year, worse recently. She also recently had pneumonia which has resolved.   Current Outpatient Prescriptions  Medication Sig Dispense Refill  . alendronate (FOSAMAX) 70 MG tablet Take 1 tablet by mouth once a week.    Marland Kitchen amLODipine (NORVASC) 2.5 MG tablet Take 1 tablet by mouth every evening.    Marland Kitchen atorvastatin (LIPITOR) 10 MG tablet Take 1 tablet by mouth daily.    . cholecalciferol (VITAMIN D) 1000 units tablet Take 1,000 Units by mouth daily.    Marland Kitchen losartan-hydrochlorothiazide (HYZAAR) 100-12.5 MG tablet Take 1 tablet by mouth daily.    Marland Kitchen omeprazole (PRILOSEC) 20 MG capsule Take 1 capsule by mouth daily.    Marland Kitchen warfarin (COUMADIN) 5 MG tablet Take 1 tablet by mouth daily.     No current facility-administered medications for this visit.    No Known Allergies  Social History   Social History  . Marital Status: Married    Spouse Name: N/A  . Number of Children: N/A  . Years of Education: N/A   Occupational History  . Not on file.   Social History Main Topics  . Smoking status: Never Smoker   . Smokeless tobacco: Not on file  . Alcohol Use: No  . Drug Use: No  . Sexual Activity: Not on file   Other Topics Concern  . Not on file   Social History Narrative     Review of Systems: General: negative for chills, fever, night sweats or weight changes.  Cardiovascular:  negative for chest pain, dyspnea on exertion, edema, orthopnea, palpitations, paroxysmal nocturnal dyspnea or shortness of breath Dermatological: negative for rash Respiratory: negative for cough or wheezing Urologic: negative for hematuria Abdominal: negative for nausea, vomiting, diarrhea, bright red blood per rectum, melena, or hematemesis Neurologic: negative for visual changes, syncope, or dizziness All other systems reviewed and are otherwise negative except as noted above.    Blood pressure 152/70, pulse 66, height 5\' 2"  (1.575 m), weight 208 lb 6.4 oz (94.53 kg).  General appearance: alert and no distress Neck: no adenopathy, no carotid bruit, no JVD, supple, symmetrical, trachea midline and thyroid not enlarged, symmetric, no tenderness/mass/nodules Lungs: clear to auscultation bilaterally Heart: regular rate and rhythm, S1, S2 normal, no murmur, click, rub or gallop Extremities: extremities normal, atraumatic, no cyanosis or edema  EKG normal sinus rhythm at 66 last year to a changes. There were septal Q waves noted. I personally reviewed this EKG.  ASSESSMENT AND PLAN:   Essential hypertension History of hypertension blood pressure measures 152/70. She is on amlodipine, losartan and hydrochlorothiazide. Continue current meds at current dosing  Hyperlipidemia History of hyperlipidemia on atorvastatin followed by her PCP  Dyspnea on exertion The patient was referred for increasing dyspnea on exertion over the last year. Her risk factors are hypertension and hyperlipidemia. She has recently had pneumonia which is resolving. Chest x-ray performed 02/16/16 was essentially normal. Her exam  is normal as well. I'm going to get a 2-D echo and a pharmacologic Myoview stress test to rule out ischemic etiology      Lorretta Harp MD [facility], [facility] 03/25/2016 10:26 AM

## 2016-03-25 NOTE — Patient Instructions (Addendum)
Medication Instructions:  Your physician recommends that you continue on your current medications as directed. Please refer to the Current Medication list given to you today.   Labwork: NONE  Testing/Procedures: Your physician has requested that you have an echocardiogram. Echocardiography is a painless test that uses sound waves to create images of your heart. It provides your doctor with information about the size and shape of your heart and how well your heart's chambers and valves are working. This procedure takes approximately one hour. There are no restrictions for this procedure.  Your physician has requested that you have a [place]. For further information please visit [email or web address]. Please follow instruction sheet as given.    Follow-Up: Your physician recommends that you schedule a follow-up appointment in: 4-5 [place].   Pharmacologic Stress Echocardiogram A pharmacologic stress echocardiogram is a heart (cardiac) test used to check the function of your heart. This test may also be called a pharmacologic stress echocardiography. Pharmacologic means that a medicine is used to increase your heart rate and blood pressure.  This stress test will check how well your heart muscle and valves are working and determine if your heart muscle is getting enough blood. Some people exercise on a treadmill, which naturally increases or stresses the functioning of their heart. For those people unable to exercise on a treadmill, a medicine is used. This medicine stimulates your heart and will cause your heart to beat harder and more quickly, as if you were exercising.  An echocardiogram uses sound waves (ultrasound) to produce an image of your heart. If your heart does not work normally, it may indicate coronary artery disease with poor coronary blood supply. The coronary arteries are the arteries that bring blood and oxygen to your heart. LET [facility] CARE PROVIDER  KNOW ABOUT:  Any allergies you have.  All medicines you are taking, including vitamins, herbs, eye drops, creams, and over-the-counter medicines.  Previous problems you or members of your family have had with the use of anesthetics.  Any blood disorders you have.  Previous surgeries you have had.  Medical conditions you have.  Possibility of pregnancy, if this applies. RISKS AND COMPLICATIONS Generally, this is a safe procedure. However, as with any procedure, complications can occur. Possible complications can include:  You develop pain or pressure in the following areas:  Chest.  Jaw or neck.  Between your shoulder blades.  Radiating down your left arm.  Headache.  Dizziness or light-headedness.  Shortness of breath.  Increased or irregular heartbeat.  Low blood pressure.  Nausea or vomiting.  Flushing.  Redness going up the arm and slight pain during injection of medicine.  Heart attack (rare). BEFORE THE PROCEDURE  Avoid all forms of caffeine for 24 hours before your test or as directed by your health care provider. This includes coffee, tea (even decaffeinated tea), caffeinated sodas, chocolate, cocoa, and certain pain medicines.  Follow your health care provider's instructions regarding eating and drinking before the test.  Take your medicines as directed at regular times with water unless instructed otherwise. Exceptions may include:  If you have diabetes, ask how you are to take your insulin or pills. It is common to adjust insulin dosing the morning of the test.  If you are taking beta-blocker medicines, it is important to talk to your health care provider about these medicines well before the date of your test. Taking beta-blocker medicines may interfere with the test. In some cases, these medicines need to  be changed or stopped 24 hours or more before the test.  If you wear a nitroglycerin patch, it may need to be removed prior to the test. Ask your  health care provider if the patch should be removed before the test.  If you use an inhaler for any breathing condition, bring it with you to the test.  If you are an outpatient, bring a snack so you can eat right after the stress phase of the test.  Do not smoke for 4 hours prior to the test or as directed by your health care provider.  Wear comfortable shoes and clothing. Let your health care provider know if you were unable to complete or follow the preparations for your test. PROCEDURE   Multiple electrodes will be put on your chest. If needed, small areas of your chest may be shaved to get better contact with the electrodes. Once the electrodes are attached to your body, multiple wires will be attached to the electrodes, and your heart rate will be monitored.  An IV access will be started, and medicine will be given.  You will have an echocardiogram done at rest and done again at peak heart rate.  To produce an image of the heart, gel is applied to your chest, and a wand-like tool (transducer) is moved over the chest. The transducer sends the sound waves through the chest to create the moving images of your heart. AFTER THE PROCEDURE  Your heart rate and blood pressure will be monitored after the test.  You may return to your normal schedule, including diet, activities, and medicines, unless your health care provider tells you otherwise.   This information is not intended to replace advice given to you by your health care provider. Make sure you discuss any questions you have with your health care provider.   Document Released: 12/24/2003 Document Revised: 10/08/2013 Document Reviewed: 06/10/2013 Elsevier Interactive Patient Education 2016 [employer].    Any Other Special Instructions Will Be Listed Below (If Applicable).     If you need a refill on your cardiac medications before your next appointment, please call your pharmacy.

## 2016-03-25 NOTE — Assessment & Plan Note (Signed)
History of hypertension blood pressure measures 152/70. She is on amlodipine, losartan and hydrochlorothiazide. Continue current meds at current dosing

## 2016-03-25 NOTE — Assessment & Plan Note (Signed)
The patient was referred for increasing dyspnea on exertion over the last year. Her risk factors are hypertension and hyperlipidemia. She has recently had pneumonia which is resolving. Chest x-ray performed 02/16/16 was essentially normal. Her exam is normal as well. I'm going to get a 2-D echo and a pharmacologic Myoview stress test to rule out ischemic etiology

## 2016-03-30 ENCOUNTER — Telehealth (HOSPITAL_COMMUNITY): Payer: Self-pay

## 2016-03-30 NOTE — Telephone Encounter (Signed)
Awaiting return call at this time. 

## 2016-03-30 NOTE — Telephone Encounter (Signed)
Due to the daughters knowledge of the appointment and that the daughter returned the call that I made to the pt earlier I did release the instructions to the daughter. She is also listed as an emergency contact on the pt file.

## 2016-03-31 DIAGNOSIS — Z7901 Long term (current) use of anticoagulants: Secondary | ICD-10-CM | POA: Diagnosis not present

## 2016-04-01 ENCOUNTER — Ambulatory Visit (HOSPITAL_COMMUNITY)
Admission: RE | Admit: 2016-04-01 | Discharge: 2016-04-01 | Disposition: A | Discharge status: Home / Self Care | Payer: PPO | Source: Ambulatory Visit | Attending: Cardiovascular Disease | Admitting: Cardiovascular Disease

## 2016-04-01 DIAGNOSIS — R002 Palpitations: Secondary | ICD-10-CM | POA: Diagnosis not present

## 2016-04-01 DIAGNOSIS — Z8249 Family history of ischemic heart disease and other diseases of the circulatory system: Secondary | ICD-10-CM | POA: Insufficient documentation

## 2016-04-01 DIAGNOSIS — R079 Chest pain, unspecified: Secondary | ICD-10-CM | POA: Insufficient documentation

## 2016-04-01 DIAGNOSIS — R0609 Other forms of dyspnea: Secondary | ICD-10-CM | POA: Diagnosis not present

## 2016-04-01 DIAGNOSIS — R5383 Other fatigue: Secondary | ICD-10-CM | POA: Diagnosis not present

## 2016-04-01 DIAGNOSIS — Z6838 Body mass index (BMI) 38.0-38.9, adult: Secondary | ICD-10-CM | POA: Insufficient documentation

## 2016-04-01 DIAGNOSIS — R0602 Shortness of breath: Secondary | ICD-10-CM

## 2016-04-01 DIAGNOSIS — R0600 Dyspnea, unspecified: Secondary | ICD-10-CM

## 2016-04-01 DIAGNOSIS — E669 Obesity, unspecified: Secondary | ICD-10-CM | POA: Diagnosis not present

## 2016-04-01 LAB — MYOCARDIAL PERFUSION IMAGING
LV dias vol: 107 mL (ref 46–106)
LV sys vol: 36 mL
Peak HR: 87 {beats}/min
Rest HR: 59 {beats}/min
SDS: 1
SRS: 4
SSS: 5
TID: 1.08

## 2016-04-01 MED ORDER — AMINOPHYLLINE 25 MG/ML IV SOLN
75.0000 mg | Freq: Once | INTRAVENOUS | Status: AC
  Administered 2016-04-01: 75 mg via INTRAVENOUS

## 2016-04-01 MED ORDER — REGADENOSON 0.4 MG/5ML IV SOLN
0.4000 mg | Freq: Once | INTRAVENOUS | Status: AC
  Administered 2016-04-01: 0.4 mg via INTRAVENOUS

## 2016-04-01 MED ORDER — TECHNETIUM TC 99M TETROFOSMIN IV KIT
10.9000 | PACK | Freq: Once | INTRAVENOUS | Status: AC | PRN
  Administered 2016-04-01: 10.9 via INTRAVENOUS
  Filled 2016-04-01: qty 11

## 2016-04-01 MED ORDER — TECHNETIUM TC 99M TETROFOSMIN IV KIT
31.2000 | PACK | Freq: Once | INTRAVENOUS | Status: AC | PRN
  Administered 2016-04-01: 31.2 via INTRAVENOUS
  Filled 2016-04-01: qty 31

## 2016-04-12 ENCOUNTER — Ambulatory Visit (HOSPITAL_COMMUNITY): Payer: PPO | Attending: Cardiology

## 2016-04-12 ENCOUNTER — Other Ambulatory Visit: Payer: Self-pay

## 2016-04-12 DIAGNOSIS — R0609 Other forms of dyspnea: Secondary | ICD-10-CM | POA: Insufficient documentation

## 2016-04-12 DIAGNOSIS — I10 Essential (primary) hypertension: Secondary | ICD-10-CM | POA: Insufficient documentation

## 2016-04-12 DIAGNOSIS — R0600 Dyspnea, unspecified: Secondary | ICD-10-CM

## 2016-04-12 DIAGNOSIS — I340 Nonrheumatic mitral (valve) insufficiency: Secondary | ICD-10-CM | POA: Insufficient documentation

## 2016-04-12 DIAGNOSIS — I071 Rheumatic tricuspid insufficiency: Secondary | ICD-10-CM | POA: Diagnosis not present

## 2016-04-12 DIAGNOSIS — R0602 Shortness of breath: Secondary | ICD-10-CM | POA: Diagnosis not present

## 2016-04-12 DIAGNOSIS — I358 Other nonrheumatic aortic valve disorders: Secondary | ICD-10-CM | POA: Diagnosis not present

## 2016-04-12 LAB — ECHOCARDIOGRAM COMPLETE
E decel time: 134 msec
E/e' ratio: 7.34
FS: 35 % (ref 28–44)
IVS/LV PW RATIO, ED: 1.01
LA ID, A-P, ES: 35 mm
LA diam end sys: 35 mm
LA diam index: 1.8 cm/m2
LA vol A4C: 53 ml
LA vol index: 28.9 mL/m2
LA vol: 56 mL
LV E/e' medial: 7.34
LV E/e'average: 7.34
LV PW d: 10.2 mm — AB (ref 0.6–1.1)
LV e' LATERAL: 12.3 cm/s
LVOT SV: 97 mL
LVOT VTI: 30.8 cm
LVOT area: 3.14 cm2
LVOT diameter: 20 mm
LVOT peak grad rest: 5 mmHg
LVOT peak vel: 115 cm/s
MV Dec: 134
MV Peak grad: 3 mmHg
MV pk A vel: 84.9 m/s
MV pk E vel: 90.3 m/s
Reg peak vel: 330 cm/s
TDI e' lateral: 12.3
TDI e' medial: 6.58
TR max vel: 330 cm/s

## 2016-04-14 DIAGNOSIS — Z7901 Long term (current) use of anticoagulants: Secondary | ICD-10-CM | POA: Diagnosis not present

## 2016-05-10 ENCOUNTER — Ambulatory Visit (INDEPENDENT_AMBULATORY_CARE_PROVIDER_SITE_OTHER): Payer: PPO | Admitting: Cardiovascular Disease

## 2016-05-10 ENCOUNTER — Telehealth: Payer: Self-pay | Admitting: Cardiovascular Disease

## 2016-05-10 ENCOUNTER — Encounter: Payer: Self-pay | Admitting: Cardiovascular Disease

## 2016-05-10 DIAGNOSIS — I10 Essential (primary) hypertension: Secondary | ICD-10-CM | POA: Diagnosis not present

## 2016-05-10 DIAGNOSIS — R0609 Other forms of dyspnea: Secondary | ICD-10-CM | POA: Diagnosis not present

## 2016-05-10 DIAGNOSIS — R0600 Dyspnea, unspecified: Secondary | ICD-10-CM

## 2016-05-10 DIAGNOSIS — E785 Hyperlipidemia, unspecified: Secondary | ICD-10-CM | POA: Diagnosis not present

## 2016-05-10 MED ORDER — AMLODIPINE BESYLATE 5 MG PO TABS: 5.0000 mg | ORAL_TABLET | Freq: Every day | ORAL | 3 refills | Status: DC

## 2016-05-10 NOTE — Telephone Encounter (Signed)
New message      The pt just left and was instructed to double up on the blood pressure medication.    Pt c/o BP issue: STAT if pt c/o blurred vision, one-sided weakness or slurred speech  1. What are your last 5 BP readings? [id number] and 144/63  2. Are you having any other symptoms (ex. Dizziness, headache, blurred vision, passed out)? no  3. What is your BP issue? The daughter was instructed to give the doubled blood pressure medication when the pt got home but the daughter is saying the b/p is normal now. She would like to know if she should still give the medication.

## 2016-05-10 NOTE — Assessment & Plan Note (Signed)
History of hypertension blood pressure measured 193/76. She is on amlodipine 2.5 mg a day and losartan/hydrochlorothiazide. I'm going to increase her amlodipine to 5 mg a day and will have her check daily blood pressures and keep a log. She will see Erasmo Downer back in the office in one month for blood pressure evaluation and titration

## 2016-05-10 NOTE — Telephone Encounter (Signed)
That time, she can keep her abdomen. Her current dose and check her blood pressures at home

## 2016-05-10 NOTE — Telephone Encounter (Signed)
Daughter called back in  Amlodipine dose increased to 5mg  today at MD appointment   Patient checked BP at home and the readings were around her normal readings - 134/68, 144/65, 144/63  Patient has had her BP cuff checked at PCP office for accuracy   Patient's daughter was concerned her elevated BP in office would be due to white coat syndrome  Will route to MD/RN

## 2016-05-10 NOTE — Assessment & Plan Note (Signed)
She's complained of increasing dyspnea on exertion. A 2-D echo was normal with grade 1 diastolic dysfunction and a Myoview stress test was low risk. She does have a history of pulmonary embolism on Coumadin anticoagulation. At this point, I do not think there is a cardiovascular etiology to her dyspnea. She has mild to moderate pulmonary hypertension was likely related to her prior pulmonary embolism.

## 2016-05-10 NOTE — Progress Notes (Signed)
05/10/2016 Sara Rodriguez   [birth year]  [id number]  Primary Physician Sara Low, MD Primary Cardiologist: Sara Harp MD Sara Rodriguez  HPI:  Sara Rodriguez is a very pleasant 82 year old mildly overweight widowed Caucasian female mother of 5 children, grandmother and 2 grandchildren is accompanied by her daughter  Sara Rodriguez. . I last saw her in the office 03/25/16. Sara Rodriguez has a history of hypertension and hyperlipidemia but no other risk factors. She's never had a heart attack or stroke. She is complaining of increased dyspnea on exertion over the last year, worse recently. She does have a history of remote pulmonary embolism on Coumadin anticoagulation. She also recently had pneumonia which has resolved. She had a recent 2-D echocardiogram that showed normal LV systolic function, grade 1 diastolic dysfunction and moderate tricuspid regurgitation with mild to moderate increase in pulmonary artery pressures. A Myoview stress test was normal.     Current Outpatient Prescriptions  Medication Sig Dispense Refill  . alendronate (FOSAMAX) 70 MG tablet Take 1 tablet by mouth once a week.    Marland Kitchen atorvastatin (LIPITOR) 10 MG tablet Take 1 tablet by mouth daily.    . cholecalciferol (VITAMIN D) 1000 units tablet Take 1,000 Units by mouth daily.    Marland Kitchen losartan-hydrochlorothiazide (HYZAAR) 100-12.5 MG tablet Take 1 tablet by mouth daily.    Marland Kitchen omeprazole (PRILOSEC) 20 MG capsule Take 1 capsule by mouth daily.    Marland Kitchen warfarin (COUMADIN) 5 MG tablet Take 1 tablet by mouth daily.    Marland Kitchen amLODipine (NORVASC) 5 MG tablet Take 1 tablet (5 mg total) by mouth daily. 180 tablet 3   No current facility-administered medications for this visit.     No Known Allergies  Social History   Social History  . Marital status: Married    Spouse name: N/A  . Number of children: N/A  . Years of education: N/A   Occupational History  . Not on file.   Social History Main Topics  . Smoking status:  Never Smoker  . Smokeless tobacco: Not on file  . Alcohol use No  . Drug use: No  . Sexual activity: Not on file   Other Topics Concern  . Not on file   Social History Narrative  . No narrative on file     Review of Systems: General: negative for chills, fever, night sweats or weight changes.  Cardiovascular: negative for chest pain, dyspnea on exertion, edema, orthopnea, palpitations, paroxysmal nocturnal dyspnea or shortness of breath Dermatological: negative for rash Respiratory: negative for cough or wheezing Urologic: negative for hematuria Abdominal: negative for nausea, vomiting, diarrhea, bright red blood per rectum, melena, or hematemesis Neurologic: negative for visual changes, syncope, or dizziness All other systems reviewed and are otherwise negative except as noted above.    Blood pressure (!) 193/76, pulse 69, height 5\' 4"  (1.626 m), weight 209 lb 6.4 oz (95 kg).  General appearance: alert and no distress Neck: no adenopathy, no carotid bruit, no JVD, supple, symmetrical, trachea midline and thyroid not enlarged, symmetric, no tenderness/mass/nodules Lungs: clear to auscultation bilaterally Heart: regular rate and rhythm, S1, S2 normal, no murmur, click, rub or gallop Extremities: extremities normal, atraumatic, no cyanosis or edema  EKG not performed today  ASSESSMENT AND PLAN:   Essential hypertension History of hypertension blood pressure measured 193/76. She is on amlodipine 2.5 mg a day and losartan/hydrochlorothiazide. I'm going to increase her amlodipine to 5 mg a day and will have her check daily blood  pressures and keep a log. She will see Sara Rodriguez back in the office in one month for blood pressure evaluation and titration  Hyperlipidemia History of hyperlipidemia on statin therapy followed by her PCP  Dyspnea on exertion She's complained of increasing dyspnea on exertion. A 2-D echo was normal with grade 1 diastolic dysfunction and a Myoview stress  test was Rodriguez risk. She does have a history of pulmonary embolism on Coumadin anticoagulation. At this point, I do not think there is a cardiovascular etiology to her dyspnea. She has mild to moderate pulmonary hypertension was likely related to her prior pulmonary embolism.      Sara Harp MD FACP,FACC,FAHA, [facility] 05/10/2016 11:20 AM

## 2016-05-10 NOTE — Patient Instructions (Signed)
Medication Instructions:  Your physician has recommended you make the following change in your medication:  1- INCREASE Amlodipine to 5 mg by mouth daily.   Follow-Up: You have been referred to [place] 1 MONTH. Your physician has requested that you regularly monitor and record your blood pressure readings at home. Please use the same machine at the same time of day to check your readings and record them to bring to your follow-up visit. BRING YOUR MACHINE TO YOUR APPOINTMENT AS WELL.   We request that you follow-up in: 6 MONTHS with an extender and in 12 MONTHS with Dr Andria Rhein will receive a reminder letter in the mail two months in advance. If you don't receive a letter, please call our office to schedule the follow-up appointment.   If you need a refill on your cardiac medications before your next appointment, please call your pharmacy.

## 2016-05-10 NOTE — Assessment & Plan Note (Signed)
History of hyperlipidemia on statin therapy followed by her PCP. 

## 2016-05-10 NOTE — Telephone Encounter (Signed)
Patient's daughter notified to go back to previous/current dose of amlodipine and monitor BP at home - no more than twice daily, 1-2 hours after taking BP meds, after resting at least 5-10 minutes, with arm at heart level.   Med list updated

## 2016-05-10 NOTE — Telephone Encounter (Signed)
LMTCB

## 2016-05-12 ENCOUNTER — Other Ambulatory Visit: Payer: Self-pay | Admitting: *Deleted

## 2016-05-12 DIAGNOSIS — Z7901 Long term (current) use of anticoagulants: Secondary | ICD-10-CM | POA: Diagnosis not present

## 2016-05-13 ENCOUNTER — Other Ambulatory Visit: Payer: Self-pay

## 2016-05-13 MED ORDER — AMLODIPINE BESYLATE 5 MG PO TABS: 5.0000 mg | ORAL_TABLET | Freq: Every day | ORAL | 3 refills | Status: DC

## 2016-06-09 DIAGNOSIS — Z7901 Long term (current) use of anticoagulants: Secondary | ICD-10-CM | POA: Diagnosis not present

## 2016-06-14 ENCOUNTER — Encounter: Payer: Self-pay | Admitting: Pharmacist

## 2016-06-14 ENCOUNTER — Ambulatory Visit (INDEPENDENT_AMBULATORY_CARE_PROVIDER_SITE_OTHER): Payer: PPO | Admitting: Pharmacist

## 2016-06-14 VITALS — BP 137/84 | Ht 64.0 in

## 2016-06-14 DIAGNOSIS — I10 Essential (primary) hypertension: Secondary | ICD-10-CM

## 2016-06-14 MED ORDER — AMLODIPINE BESYLATE 5 MG PO TABS: 5.0000 mg | ORAL_TABLET | Freq: Every day | ORAL | 0 refills | Status: DC

## 2016-06-14 NOTE — Progress Notes (Signed)
Patient ID: [name]                 DOB: 1932-11-29                      MRN: [id number]     HPI: Sara Rodriguez is a 82 y.o. female patient of Dr. Gwenlyn Found with [place] [facility] who presents today for hypertension evaluation.  She was recently seen by Dr. Gwenlyn Found at which time her blood pressure was extremely elevated. Her amlodipine was increased to 5mg  daily, but when she returned home her blood pressure returned to normal per her cuff. She denies any dizziness. She states she occasionally awakes in the morning with headaches but does not attribute this to her blood pressure.   She presents today with her daughter and her home blood pressure cuff. Her daughter believes the patient may suffer from white coat hypertension.   Cardiac Hx: HTN, HLD  Current HTN meds:  Amlodipine 2.5 mg QPM Losartan/HCTZ 100/12.5mg  QAM  Previously tried: something similar to losartan changed for insurance purposes.   BP goal: <150/90  Family History: Mother had low blood pressure. She is unsure of father's cardiac history. She has sisters with high blood pressure.   Social History: She has never used tobacco products. She denies alcohol.   Diet: She eats most of her meals from home. She uses salt and seasoning while cooking but does not add additional salt at the table. She endorses 1-2 cups of coffee per day and drinks mostly water other than that.   Exercise: She does not do any formal exercise, but she does clean her own home and mows her yard as well as her daughter's yard.   Home BP readings: She has a [employer] brand arm cuff that appears to be too small. Despite being too small her cuff measures about 30mmHg less than the manual measurement in office today.   Her home cuff reveals 11/43 readings >150. All diastolics have been WNL.   Wt Readings from Last 3 Encounters:  05/10/16 209 lb 6.4 oz (95 kg)  04/01/16 208 lb (94.3 kg)  03/25/16 208 lb 6.4 oz (94.5 kg)   BP Readings from Last 3 Encounters:    06/14/16 (!) 168/72  05/10/16 (!) 193/76  03/25/16 (!) 152/70   Pulse Readings from Last 3 Encounters:  05/10/16 69  03/25/16 66  01/29/10 72    Renal function: CrCl cannot be calculated (Unknown ideal weight.).  Past Medical History:  Diagnosis Date  . Hyperlipidemia   . Hypertension   . Pulmonary embolism ([place]) 2011    Current Outpatient Prescriptions on File Prior to Visit  Medication Sig Dispense Refill  . alendronate (FOSAMAX) 70 MG tablet Take 1 tablet by mouth once a week.    Marland Kitchen atorvastatin (LIPITOR) 10 MG tablet Take 1 tablet by mouth daily.    . cholecalciferol (VITAMIN D) 1000 units tablet Take 1,000 Units by mouth daily.    Marland Kitchen losartan-hydrochlorothiazide (HYZAAR) 100-12.5 MG tablet Take 1 tablet by mouth daily.    Marland Kitchen omeprazole (PRILOSEC) 20 MG capsule Take 1 capsule by mouth daily.    Marland Kitchen warfarin (COUMADIN) 5 MG tablet Take 1 tablet by mouth daily.     No current facility-administered medications on file prior to visit.     No Known Allergies  Blood pressure (!) 168/72, height 5\' 4"  (1.626 m).   Assessment/Plan: Hypertension: BP today is above goal. Will increase amlodipine to 5mg  daily. Have  asked that she monitor her blood pressure with the appropriate size cuff on the new machine she will purchase. She will bring log to next visit in 4 weeks.   Thank you, [name], [facility]  06/14/2016 1:44 PM

## 2016-06-14 NOTE — Patient Instructions (Addendum)
Return for a a follow up appointment in 4 weeks   Check your blood pressure at home daily (if able) and keep record of the readings.  Take your BP meds as follows: Increase amlodipine to 5mg  daily (you may take 2 of your current supply until you run out then pick up higher strength and take 1 tablet daily)  Bring all of your meds, your BP cuff and your record of home blood pressures to your next appointment.  Exercise as you're able, try to walk approximately 30 minutes per day.  Keep salt intake to a minimum, especially watch canned and prepared boxed foods.  Eat more fresh fruits and vegetables and fewer canned items.  Avoid eating in fast food restaurants.    HOW TO TAKE YOUR BLOOD PRESSURE: . Rest 5 minutes before taking your blood pressure. .  Don't smoke or drink caffeinated beverages for at least 30 minutes before. . Take your blood pressure before (not after) you eat. . Sit comfortably with your back supported and both feet on the floor (don't cross your legs). . Elevate your arm to heart level on a table or a desk. . Use the proper sized cuff. It should fit smoothly and snugly around your bare upper arm. There should be enough room to slip a fingertip under the cuff. The bottom edge of the cuff should be 1 inch above the crease of the elbow. . Ideally, take 3 measurements at one sitting and record the average.

## 2016-06-23 DIAGNOSIS — Z7901 Long term (current) use of anticoagulants: Secondary | ICD-10-CM | POA: Diagnosis not present

## 2016-07-14 ENCOUNTER — Ambulatory Visit (INDEPENDENT_AMBULATORY_CARE_PROVIDER_SITE_OTHER): Payer: PPO | Admitting: Pharmacist

## 2016-07-14 ENCOUNTER — Encounter: Payer: Self-pay | Admitting: Pharmacist

## 2016-07-14 VITALS — BP 142/58 | HR 65

## 2016-07-14 DIAGNOSIS — I10 Essential (primary) hypertension: Secondary | ICD-10-CM

## 2016-07-14 MED ORDER — AMLODIPINE BESYLATE 5 MG PO TABS: 5.0000 mg | ORAL_TABLET | Freq: Every day | ORAL | 2 refills | Status: DC

## 2016-07-14 NOTE — Progress Notes (Signed)
Patient ID: JACOBY BEIRNE                 DOB: 31-Jul-1933                      MRN: [id number]     HPI: Sara Rodriguez is a 82 y.o. female patient of Dr. Gwenlyn Found with [facility] who presents today for hypertension follow up. At her most recent visit with hypertension clinic her amlodipine was increased to 5mg  each evening.   She reports feeling no different since increasing amlodipine. She does state her blood pressure look better at home since change in dose. Denies swelling.    Cardiac Hx: HTN, HLD  Current HTN meds:  Amlodipine 5 mg QPM Losartan/HCTZ 100/12.5mg  QAM  Previously tried: something similar to losartan changed for insurance purposes.   BP goal: <150/90  Family History: Mother had low blood pressure. She is unsure of father's cardiac history. She has sisters with high blood pressure.   Social History: She has never used tobacco products. She denies alcohol.   Diet: She eats most of her meals from home. She uses salt and seasoning while cooking but does not add additional salt at the table. She endorses 1-2 cups of coffee per day and drinks mostly water other than that.   Exercise: She does not do any formal exercise, but she does clean her own home and mows her yard as well as her daughter's yard.   Home BP readings:  She has purchased new cuff, but did not bring with her today to calibrate.   Her home pressures appear to be improved over the last 2 weeks. She had a short period about 3 weeks ago with pressures [id number] systolic, but states she was sick during this time.   Otherwise her pressures have been avg lo140s/mid60s.   Wt Readings from Last 3 Encounters:  05/10/16 209 lb 6.4 oz (95 kg)  04/01/16 208 lb (94.3 kg)  03/25/16 208 lb 6.4 oz (94.5 kg)   BP Readings from Last 3 Encounters:  07/14/16 (!) 142/58  06/14/16 137/84  05/10/16 (!) 193/76   Pulse Readings from Last 3 Encounters:  07/14/16 65  05/10/16 69  03/25/16 66    Renal function: CrCl  cannot be calculated (Unknown ideal weight.).  Past Medical History:  Diagnosis Date  . Hyperlipidemia   . Hypertension   . Pulmonary embolism ([place]) 2011    Current Outpatient Prescriptions on File Prior to Visit  Medication Sig Dispense Refill  . alendronate (FOSAMAX) 70 MG tablet Take 1 tablet by mouth once a week.    Marland Kitchen atorvastatin (LIPITOR) 10 MG tablet Take 1 tablet by mouth daily.    . cholecalciferol (VITAMIN D) 1000 units tablet Take 1,000 Units by mouth daily.    Marland Kitchen losartan-hydrochlorothiazide (HYZAAR) 100-12.5 MG tablet Take 1 tablet by mouth daily.    Marland Kitchen omeprazole (PRILOSEC) 20 MG capsule Take 1 capsule by mouth daily.    Marland Kitchen warfarin (COUMADIN) 5 MG tablet Take 1 tablet by mouth daily.     No current facility-administered medications on file prior to visit.     No Known Allergies  Blood pressure (!) 142/58, pulse 65, SpO2 98 %.   Assessment/Plan: Hypertension: BP is at goal. No medication changes today. Follow up with Dr. Gwenlyn Found as scheduled and hypertension clinic as needed.    Thank you, [name], [facility]  07/14/2016 10:55 AM

## 2016-07-14 NOTE — Patient Instructions (Signed)
Return for a follow up appointment in 1 year as scheduled with Dr. Gwenlyn Found   Check your blood pressure at home daily (if able) and keep record of the readings.  Take your BP meds as follows: Continue amlodipine 5mg  in the evening Continue losartan/HCTZ in the morning  Bring all of your meds, your BP cuff and your record of home blood pressures to your next appointment.  Exercise as you're able, try to walk approximately 30 minutes per day.  Keep salt intake to a minimum, especially watch canned and prepared boxed foods.  Eat more fresh fruits and vegetables and fewer canned items.  Avoid eating in fast food restaurants.    HOW TO TAKE YOUR BLOOD PRESSURE: . Rest 5 minutes before taking your blood pressure. .  Don't smoke or drink caffeinated beverages for at least 30 minutes before. . Take your blood pressure before (not after) you eat. . Sit comfortably with your back supported and both feet on the floor (don't cross your legs). . Elevate your arm to heart level on a table or a desk. . Use the proper sized cuff. It should fit smoothly and snugly around your bare upper arm. There should be enough room to slip a fingertip under the cuff. The bottom edge of the cuff should be 1 inch above the crease of the elbow. . Ideally, take 3 measurements at one sitting and record the average.

## 2016-07-21 DIAGNOSIS — Z7901 Long term (current) use of anticoagulants: Secondary | ICD-10-CM | POA: Diagnosis not present

## 2016-08-18 DIAGNOSIS — Z7901 Long term (current) use of anticoagulants: Secondary | ICD-10-CM | POA: Diagnosis not present

## 2016-09-07 DIAGNOSIS — Z0000 Encounter for general adult medical examination without abnormal findings: Secondary | ICD-10-CM | POA: Diagnosis not present

## 2016-09-07 DIAGNOSIS — I10 Essential (primary) hypertension: Secondary | ICD-10-CM | POA: Diagnosis not present

## 2016-09-07 DIAGNOSIS — I2699 Other pulmonary embolism without acute cor pulmonale: Secondary | ICD-10-CM | POA: Diagnosis not present

## 2016-09-07 DIAGNOSIS — E782 Mixed hyperlipidemia: Secondary | ICD-10-CM | POA: Diagnosis not present

## 2016-09-07 DIAGNOSIS — Z1389 Encounter for screening for other disorder: Secondary | ICD-10-CM | POA: Diagnosis not present

## 2016-09-07 DIAGNOSIS — Z23 Encounter for immunization: Secondary | ICD-10-CM | POA: Diagnosis not present

## 2016-09-07 DIAGNOSIS — I82402 Acute embolism and thrombosis of unspecified deep veins of left lower extremity: Secondary | ICD-10-CM | POA: Diagnosis not present

## 2016-09-07 DIAGNOSIS — N183 Chronic kidney disease, stage 3 (moderate): Secondary | ICD-10-CM | POA: Diagnosis not present

## 2016-09-07 DIAGNOSIS — M810 Age-related osteoporosis without current pathological fracture: Secondary | ICD-10-CM | POA: Diagnosis not present

## 2016-09-07 DIAGNOSIS — Z7901 Long term (current) use of anticoagulants: Secondary | ICD-10-CM | POA: Diagnosis not present

## 2016-09-07 DIAGNOSIS — D682 Hereditary deficiency of other clotting factors: Secondary | ICD-10-CM | POA: Diagnosis not present

## 2016-09-07 DIAGNOSIS — R7309 Other abnormal glucose: Secondary | ICD-10-CM | POA: Diagnosis not present

## 2016-09-20 DIAGNOSIS — H40013 Open angle with borderline findings, low risk, bilateral: Secondary | ICD-10-CM | POA: Diagnosis not present

## 2016-09-20 DIAGNOSIS — H2513 Age-related nuclear cataract, bilateral: Secondary | ICD-10-CM | POA: Diagnosis not present

## 2016-09-20 DIAGNOSIS — H353132 Nonexudative age-related macular degeneration, bilateral, intermediate dry stage: Secondary | ICD-10-CM | POA: Diagnosis not present

## 2016-09-20 DIAGNOSIS — H35373 Puckering of macula, bilateral: Secondary | ICD-10-CM | POA: Diagnosis not present

## 2016-10-03 DIAGNOSIS — Z1211 Encounter for screening for malignant neoplasm of colon: Secondary | ICD-10-CM | POA: Diagnosis not present

## 2016-10-05 DIAGNOSIS — Z7901 Long term (current) use of anticoagulants: Secondary | ICD-10-CM | POA: Diagnosis not present

## 2016-10-17 HISTORY — PX: CATARACT EXTRACTION: SUR2

## 2016-11-01 DIAGNOSIS — M1712 Unilateral primary osteoarthritis, left knee: Secondary | ICD-10-CM | POA: Diagnosis not present

## 2016-11-07 DIAGNOSIS — Z7901 Long term (current) use of anticoagulants: Secondary | ICD-10-CM | POA: Diagnosis not present

## 2016-11-29 DIAGNOSIS — D682 Hereditary deficiency of other clotting factors: Secondary | ICD-10-CM | POA: Diagnosis not present

## 2016-11-29 DIAGNOSIS — I10 Essential (primary) hypertension: Secondary | ICD-10-CM | POA: Diagnosis not present

## 2016-11-29 DIAGNOSIS — I2699 Other pulmonary embolism without acute cor pulmonale: Secondary | ICD-10-CM | POA: Diagnosis not present

## 2016-11-29 DIAGNOSIS — N183 Chronic kidney disease, stage 3 (moderate): Secondary | ICD-10-CM | POA: Diagnosis not present

## 2016-11-29 DIAGNOSIS — M1990 Unspecified osteoarthritis, unspecified site: Secondary | ICD-10-CM | POA: Diagnosis not present

## 2016-11-29 DIAGNOSIS — K219 Gastro-esophageal reflux disease without esophagitis: Secondary | ICD-10-CM | POA: Diagnosis not present

## 2016-11-29 DIAGNOSIS — E782 Mixed hyperlipidemia: Secondary | ICD-10-CM | POA: Diagnosis not present

## 2016-11-29 DIAGNOSIS — M810 Age-related osteoporosis without current pathological fracture: Secondary | ICD-10-CM | POA: Diagnosis not present

## 2016-12-05 DIAGNOSIS — Z7901 Long term (current) use of anticoagulants: Secondary | ICD-10-CM | POA: Diagnosis not present

## 2016-12-19 DIAGNOSIS — M170 Bilateral primary osteoarthritis of knee: Secondary | ICD-10-CM | POA: Diagnosis not present

## 2016-12-20 DIAGNOSIS — Z7901 Long term (current) use of anticoagulants: Secondary | ICD-10-CM | POA: Diagnosis not present

## 2016-12-26 DIAGNOSIS — M170 Bilateral primary osteoarthritis of knee: Secondary | ICD-10-CM | POA: Diagnosis not present

## 2017-01-02 DIAGNOSIS — M170 Bilateral primary osteoarthritis of knee: Secondary | ICD-10-CM | POA: Diagnosis not present

## 2017-01-04 DIAGNOSIS — I2699 Other pulmonary embolism without acute cor pulmonale: Secondary | ICD-10-CM | POA: Diagnosis not present

## 2017-02-01 DIAGNOSIS — Z7901 Long term (current) use of anticoagulants: Secondary | ICD-10-CM | POA: Diagnosis not present

## 2017-02-03 DIAGNOSIS — Z7901 Long term (current) use of anticoagulants: Secondary | ICD-10-CM | POA: Diagnosis not present

## 2017-02-06 DIAGNOSIS — Z7901 Long term (current) use of anticoagulants: Secondary | ICD-10-CM | POA: Diagnosis not present

## 2017-02-13 DIAGNOSIS — Z7901 Long term (current) use of anticoagulants: Secondary | ICD-10-CM | POA: Diagnosis not present

## 2017-02-27 DIAGNOSIS — Z7901 Long term (current) use of anticoagulants: Secondary | ICD-10-CM | POA: Diagnosis not present

## 2017-03-06 DIAGNOSIS — Z7901 Long term (current) use of anticoagulants: Secondary | ICD-10-CM | POA: Diagnosis not present

## 2017-03-16 DIAGNOSIS — M170 Bilateral primary osteoarthritis of knee: Secondary | ICD-10-CM | POA: Diagnosis not present

## 2017-03-20 DIAGNOSIS — Z7901 Long term (current) use of anticoagulants: Secondary | ICD-10-CM | POA: Diagnosis not present

## 2017-03-21 DIAGNOSIS — H353131 Nonexudative age-related macular degeneration, bilateral, early dry stage: Secondary | ICD-10-CM | POA: Diagnosis not present

## 2017-03-21 DIAGNOSIS — H35373 Puckering of macula, bilateral: Secondary | ICD-10-CM | POA: Diagnosis not present

## 2017-03-21 DIAGNOSIS — H40013 Open angle with borderline findings, low risk, bilateral: Secondary | ICD-10-CM | POA: Diagnosis not present

## 2017-03-21 DIAGNOSIS — H2513 Age-related nuclear cataract, bilateral: Secondary | ICD-10-CM | POA: Diagnosis not present

## 2017-04-10 DIAGNOSIS — M1990 Unspecified osteoarthritis, unspecified site: Secondary | ICD-10-CM | POA: Diagnosis not present

## 2017-04-10 DIAGNOSIS — I2699 Other pulmonary embolism without acute cor pulmonale: Secondary | ICD-10-CM | POA: Diagnosis not present

## 2017-04-10 DIAGNOSIS — Z7901 Long term (current) use of anticoagulants: Secondary | ICD-10-CM | POA: Diagnosis not present

## 2017-04-10 DIAGNOSIS — K219 Gastro-esophageal reflux disease without esophagitis: Secondary | ICD-10-CM | POA: Diagnosis not present

## 2017-04-10 DIAGNOSIS — N183 Chronic kidney disease, stage 3 (moderate): Secondary | ICD-10-CM | POA: Diagnosis not present

## 2017-04-10 DIAGNOSIS — E782 Mixed hyperlipidemia: Secondary | ICD-10-CM | POA: Diagnosis not present

## 2017-04-10 DIAGNOSIS — I10 Essential (primary) hypertension: Secondary | ICD-10-CM | POA: Diagnosis not present

## 2017-04-10 DIAGNOSIS — I82402 Acute embolism and thrombosis of unspecified deep veins of left lower extremity: Secondary | ICD-10-CM | POA: Diagnosis not present

## 2017-04-17 DIAGNOSIS — Z7901 Long term (current) use of anticoagulants: Secondary | ICD-10-CM | POA: Diagnosis not present

## 2017-05-02 DIAGNOSIS — M1712 Unilateral primary osteoarthritis, left knee: Secondary | ICD-10-CM | POA: Diagnosis not present

## 2017-05-08 ENCOUNTER — Other Ambulatory Visit: Payer: Self-pay | Admitting: Internal Medicine

## 2017-05-08 DIAGNOSIS — Z1231 Encounter for screening mammogram for malignant neoplasm of breast: Secondary | ICD-10-CM

## 2017-05-11 ENCOUNTER — Ambulatory Visit
Admission: RE | Admit: 2017-05-11 | Discharge: 2017-05-11 | Disposition: A | Discharge status: Home / Self Care | Payer: PPO | Source: Ambulatory Visit | Attending: Internal Medicine | Admitting: Internal Medicine

## 2017-05-11 ENCOUNTER — Telehealth: Payer: Self-pay | Admitting: Cardiovascular Disease

## 2017-05-11 DIAGNOSIS — Z1231 Encounter for screening mammogram for malignant neoplasm of breast: Secondary | ICD-10-CM

## 2017-05-11 NOTE — Telephone Encounter (Signed)
Patient is not followed for warfarin/INR by our practice.  Please contact PCP (or patient to determine who is following).

## 2017-05-11 NOTE — Telephone Encounter (Signed)
Requesting surgical clearance:  1. Type of surgery: Left Total Knee Replacement  2. Surgeon: Dr. Elsie Saas  3.Surgical Date:  tbd  4. Medications that need to be held: Warfarin   5. CAD: No  6. I will defer to:  Dr. Gwenlyn Found and PharmD   Contact Information:  [facility] Fax:  [phone]

## 2017-05-14 NOTE — Telephone Encounter (Signed)
With H/O PE, will prob require lovenox bridging for TKR

## 2017-05-15 DIAGNOSIS — Z7901 Long term (current) use of anticoagulants: Secondary | ICD-10-CM | POA: Diagnosis not present

## 2017-05-15 NOTE — Telephone Encounter (Signed)
Check with Sara Rodriguez, she will need to contact PCP to see if they can bridge

## 2017-05-15 NOTE — Telephone Encounter (Signed)
Can you call them to see if they can bridge?  Thx

## 2017-05-18 NOTE — Telephone Encounter (Signed)
Left message with Dr. Sherilyn Cooter MA, Elmyra Ricks. Asked to call back as soon as she can.

## 2017-05-18 NOTE — Telephone Encounter (Signed)
Spoke with Sara Rodriguez, who asked me to fax clearance over---they do follow her INR and Warfarin. Faxing this note via EPIC to [phone].

## 2017-06-12 DIAGNOSIS — Z7901 Long term (current) use of anticoagulants: Secondary | ICD-10-CM | POA: Diagnosis not present

## 2017-06-14 DIAGNOSIS — I10 Essential (primary) hypertension: Secondary | ICD-10-CM | POA: Diagnosis not present

## 2017-06-27 ENCOUNTER — Ambulatory Visit: Payer: PPO | Admitting: Cardiovascular Disease

## 2017-06-27 DIAGNOSIS — Z7901 Long term (current) use of anticoagulants: Secondary | ICD-10-CM | POA: Diagnosis not present

## 2017-06-28 ENCOUNTER — Ambulatory Visit (INDEPENDENT_AMBULATORY_CARE_PROVIDER_SITE_OTHER): Payer: PPO | Admitting: Cardiovascular Disease

## 2017-06-28 ENCOUNTER — Encounter: Payer: Self-pay | Admitting: Cardiovascular Disease

## 2017-06-28 DIAGNOSIS — I10 Essential (primary) hypertension: Secondary | ICD-10-CM | POA: Diagnosis not present

## 2017-06-28 DIAGNOSIS — E7800 Pure hypercholesterolemia, unspecified: Secondary | ICD-10-CM | POA: Diagnosis not present

## 2017-06-28 NOTE — Assessment & Plan Note (Addendum)
History of essential hypertension with blood pressure measured 165/65 which fell to 132/66 at the end of the interview.. She is on amlodipine, losartan and hydrochlorothiazide. Continue current meds at current dosing

## 2017-06-28 NOTE — Assessment & Plan Note (Signed)
History of hyperlipidemia on statin therapy followed by her PCP. 

## 2017-06-28 NOTE — Assessment & Plan Note (Signed)
History of pulmonary embolus along Coumadin anticoagulation

## 2017-06-28 NOTE — Patient Instructions (Signed)

## 2017-06-28 NOTE — Progress Notes (Signed)
06/28/2017 Sara Rodriguez   04-17-1933  [id number]  Primary Physician Wenda Low, MD Primary Cardiologist: Lorretta Harp MD Sara Rodriguez, [place]  HPI:  Sara Rodriguez is a 83 y.o. female  mildly overweight widowed Caucasian female mother of 5 children, grandmother and 2 grandchildren is accompanied by her daughter  Sara Rodriguez. . I last saw her in the office 05/10/16 . [name] has a history of hypertension and hyperlipidemia but no other risk factors. She's never had a heart attack or stroke. She is complaining of increased dyspnea on exertion over the last year, worse recently. She does have a history of remote pulmonary embolism on Coumadin anticoagulation. She also recently had pneumonia which has resolved. She had a recent 2-D echocardiogram that showed normal LV systolic function, grade 1 diastolic dysfunction and moderate tricuspid regurgitation with mild to moderate increase in pulmonary artery pressures. A Myoview stress test was normal. She is scheduled for elective left total knee replacement 08/21/17. She'll be cleared at low risk.   Current Meds  Medication Sig  . alendronate (FOSAMAX) 70 MG tablet Take 1 tablet by mouth once a week.  Marland Kitchen amLODipine (NORVASC) 5 MG tablet Take 1 tablet (5 mg total) by mouth daily.  Marland Kitchen atorvastatin (LIPITOR) 10 MG tablet Take 1 tablet by mouth daily.  . cholecalciferol (VITAMIN D) 1000 units tablet Take 1,000 Units by mouth daily.  Marland Kitchen losartan-hydrochlorothiazide (HYZAAR) 100-12.5 MG tablet Take 1 tablet by mouth daily.  Marland Kitchen omeprazole (PRILOSEC) 20 MG capsule Take 1 capsule by mouth daily.  Marland Kitchen warfarin (COUMADIN) 5 MG tablet Take 1 tablet by mouth daily.     No Known Allergies  Social History   Social History  . Marital status: Married    Spouse name: N/A  . Number of children: N/A  . Years of education: N/A   Occupational History  . Not on file.   Social History Main Topics  . Smoking status: Never Smoker  . Smokeless tobacco:  Never Used  . Alcohol use No  . Drug use: No  . Sexual activity: Not on file   Other Topics Concern  . Not on file   Social History Narrative  . No narrative on file     Review of Systems: General: negative for chills, fever, night sweats or weight changes.  Cardiovascular: negative for chest pain, dyspnea on exertion, edema, orthopnea, palpitations, paroxysmal nocturnal dyspnea or shortness of breath Dermatological: negative for rash Respiratory: negative for cough or wheezing Urologic: negative for hematuria Abdominal: negative for nausea, vomiting, diarrhea, bright red blood per rectum, melena, or hematemesis Neurologic: negative for visual changes, syncope, or dizziness All other systems reviewed and are otherwise negative except as noted above.    Blood pressure (!) 165/65, pulse 68, height 5\' 4"  (1.626 m).  General appearance: alert and no distress Neck: no adenopathy, no carotid bruit, no JVD, supple, symmetrical, trachea midline and thyroid not enlarged, symmetric, no tenderness/mass/nodules Lungs: clear to auscultation bilaterally Heart: regular rate and rhythm, S1, S2 normal, no murmur, click, rub or gallop Extremities: extremities normal, atraumatic, no cyanosis or edema  EKG sinus rhythm at 68 without ST or T-wave changes. I personally reviewed this EKG.  ASSESSMENT AND PLAN:   PULMONARY EMBOLISM History of pulmonary embolus along Coumadin anticoagulation  Essential hypertension History of essential hypertension with blood pressure measured 165/65. She is on amlodipine, losartan and hydrochlorothiazide. Continue current meds at current dosing  Hyperlipidemia History of hyperlipidemia on statin therapy followed  by her PCP      Lorretta Harp MD [facility], [facility] 06/28/2017 12:56 PM

## 2017-07-11 DIAGNOSIS — I2699 Other pulmonary embolism without acute cor pulmonale: Secondary | ICD-10-CM | POA: Diagnosis not present

## 2017-07-25 DIAGNOSIS — Z7901 Long term (current) use of anticoagulants: Secondary | ICD-10-CM | POA: Diagnosis not present

## 2017-08-08 DIAGNOSIS — Z7901 Long term (current) use of anticoagulants: Secondary | ICD-10-CM | POA: Diagnosis not present

## 2017-08-09 ENCOUNTER — Encounter (HOSPITAL_COMMUNITY): Payer: Self-pay | Admitting: Physician Assistant

## 2017-08-09 DIAGNOSIS — I10 Essential (primary) hypertension: Secondary | ICD-10-CM | POA: Diagnosis present

## 2017-08-09 DIAGNOSIS — M1712 Unilateral primary osteoarthritis, left knee: Secondary | ICD-10-CM | POA: Diagnosis present

## 2017-08-09 DIAGNOSIS — D6851 Activated protein C resistance: Secondary | ICD-10-CM | POA: Diagnosis present

## 2017-08-09 NOTE — H&P (Signed)
TOTAL KNEE ADMISSION H&P  Patient is being admitted for left total knee arthroplasty.  Subjective:  Chief Complaint:left knee pain.  HPI: Sara Rodriguez, 83 y.o. female, has a history of pain and functional disability in the left knee due to arthritis and has failed non-surgical conservative treatments for greater than 12 weeks to includeNSAID's and/or analgesics, corticosteriod injections, viscosupplementation injections, flexibility and strengthening excercises, supervised PT with diminished ADL's post treatment, weight reduction as appropriate and activity modification.  Onset of symptoms was gradual, starting 10 years ago with gradually worsening course since that time. The patient noted prior procedures on the knee to include  arthroscopy and menisectomy on the left knee(s).  Patient currently rates pain in the left knee(s) at 10 out of 10 with activity. Patient has night pain, worsening of pain with activity and weight bearing, pain that interferes with activities of daily living, crepitus and joint swelling.  Patient has evidence of subchondral sclerosis, periarticular osteophytes and joint space narrowing by imaging studies. There is no active infection.  Patient Active Problem List   Diagnosis Date Noted  . Primary localized osteoarthritis of left knee   . Hypertension   . Essential hypertension 03/25/2016  . Hyperlipidemia 03/25/2016  . Dyspnea on exertion 03/25/2016  . PULMONARY EMBOLISM 01/14/2010  . PLEURAL EFFUSION, RIGHT 01/14/2010  . MASS, LUNG 01/14/2010  . Pulmonary embolism ([place]) 10/17/2009   Past Medical History:  Diagnosis Date  . Hyperlipidemia   . Hypertension   . Primary localized osteoarthritis of left knee   . Pulmonary embolism ([place]) 2011    Past Surgical History:  Procedure Laterality Date  . ABDOMINAL HYSTERECTOMY  1973    No current facility-administered medications for this encounter.    Current Outpatient Prescriptions  Medication Sig Dispense  Refill Last Dose  . acetaminophen (TYLENOL) 500 MG tablet Take 1,000 mg by mouth every 8 (eight) hours as needed for mild pain or headache.     . alendronate (FOSAMAX) 70 MG tablet Take 1 tablet by mouth once a week. Sunday   Taking  . amLODipine (NORVASC) 5 MG tablet Take 1 tablet (5 mg total) by mouth daily. (Patient taking differently: Take 5 mg by mouth 2 (two) times daily. ) 90 tablet 2 Taking  . atorvastatin (LIPITOR) 10 MG tablet Take 1 tablet by mouth daily.   Taking  . Cholecalciferol (VITAMIN D) 2000 units CAPS Take 2,000 Units by mouth daily.     . diclofenac sodium (VOLTAREN) 1 % GEL APP 2 TO 4 GRAMS 4 TIMES DAILY AS NEEDED FOR PAIN  2   . losartan-hydrochlorothiazide (HYZAAR) 100-12.5 MG tablet Take 1 tablet by mouth daily.   Taking  . Omega-3 Fatty Acids (FISH OIL) 1000 MG CAPS Take 1,000 mg by mouth daily.     Marland Kitchen omeprazole (PRILOSEC) 20 MG capsule Take 20 mg by mouth daily.    Taking  . tetrahydrozoline-zinc (VISINE-AC) 0.05-0.25 % ophthalmic solution Place 2 drops into both eyes daily as needed (dry eyes).     . warfarin (COUMADIN) 5 MG tablet Take 5-10 mg by mouth daily at 6 PM. Takes 5 mg daily except on Wednesday and fridays and takes 2 tablets (10 mg)   Taking   No Known Allergies  Social History  Substance Use Topics  . Smoking status: Never Smoker  . Smokeless tobacco: Never Used  . Alcohol use No    Family History  Problem Relation Age of Onset  . Heart disease Mother  heart surgery  . Parkinson's disease Father   . Diabetes Sister   . Diabetes Brother   . Stroke Brother   . Arrhythmia Daughter        afib  . Breast cancer Neg Hx      Review of Systems  Constitutional: Negative.   HENT: Negative.   Respiratory: Positive for shortness of breath. Negative for cough, hemoptysis and sputum production.   Cardiovascular: Negative.   Gastrointestinal: Negative.   Genitourinary: Negative.   Musculoskeletal: Negative.   Skin: Negative.   Neurological:  Negative.   Endo/Heme/Allergies: Negative.   Psychiatric/Behavioral: Negative.     Objective:  Physical Exam  Constitutional: She is oriented to person, place, and time. She appears well-developed and well-nourished.  HENT:  Head: Normocephalic and atraumatic.  Mouth/Throat: Oropharynx is clear and moist.  Eyes: Pupils are equal, round, and reactive to light. Conjunctivae and EOM are normal.  Neck: Neck supple.  Cardiovascular: Normal rate.   Respiratory: Effort normal.  GI: Soft.  Genitourinary:  Genitourinary Comments: Not pertinent to current symptomatology therefore not examined.  Musculoskeletal:  Left knee has active range of motion 0-120 degrees.  2+ crepitus.  2+ synovitis.  Medial joint line tenderness.  Right knee has 1+ crepitus.  1+ synovitis.  Minimal pain today.  She has 2+ dorsalis pedis pulses.    Neurological: She is alert and oriented to person, place, and time.  Skin: Skin is warm and dry.  Psychiatric: She has a normal mood and affect. Her behavior is normal.    Vital signs in last 24 hours: Temp:  [97.6 F (36.4 C)] 97.6 F (36.4 C) (10/24 1500) Pulse Rate:  [82] 82 (10/24 1500) BP: (163)/(71) 163/71 (10/24 1500) SpO2:  [95 %] 95 % (10/24 1500) Weight:  [95.7 kg (211 lb)] 95.7 kg (211 lb) (10/24 1500)  Labs:   Estimated body mass index is 36.22 kg/m as calculated from the following:   Height as of this encounter: 5\' 4"  (1.626 m).   Weight as of this encounter: 95.7 kg (211 lb).   Imaging Review Plain radiographs demonstrate severe degenerative joint disease of the left knee(s). The overall alignment issignificant varus. The bone quality appears to be good for age and reported activity level.  Assessment/Plan:  End stage arthritis, left knee  Active Problems:   Primary localized osteoarthritis of left knee   Pulmonary embolism (HCC)   Hypertension   Factor V Leiden ([place])  The patient history, physical examination, clinical judgment of the  provider and imaging studies are consistent with end stage degenerative joint disease of the left knee(s) and total knee arthroplasty is deemed medically necessary. The treatment options including medical management, injection therapy arthroscopy and arthroplasty were discussed at length. The risks and benefits of total knee arthroplasty were presented and reviewed. The risks due to aseptic loosening, infection, stiffness, patella tracking problems, thromboembolic complications and other imponderables were discussed. The patient acknowledged the explanation, agreed to proceed with the plan and consent was signed. Patient is being admitted for inpatient treatment for surgery, pain control, PT, OT, prophylactic antibiotics, VTE prophylaxis, progressive ambulation and ADL's and discharge planning. The patient is planning to be discharged home with home health services   NO TXA DUE TO FACTOR V AND HISTORY OF PE

## 2017-08-09 NOTE — Pre-Procedure Instructions (Signed)
DAMISHA WOLFF  08/09/2017      [facility], [place] [address] Phone: [phone] Fax: [phone]  [facility] [place], [place] [address] Phone: [phone] Fax: [phone]  [facility] [place], [place] - [address] [place] [address] Phone: [phone] Fax: [phone]    Your procedure is scheduled on August 21, 2017.  Report to [facility] Admitting at 745 AM.  Call this number if you have problems the morning of surgery:  [phone]   Remember:  Do not eat food or drink liquids after midnight.  Take these medicines the morning of surgery with A SIP OF WATER acetaminophen (tylenol), amlodipine (norvasc), omeprazole (prilosec), eye drops-if needed  Stop coumadin as instructed by your surgeon.  7 days prior to surgery STOP taking any voltaren gel, Aspirin (unless otherwise instructed by your surgeon), Aleve, Naproxen, Ibuprofen, Motrin, Advil, Goody's, BC's, all herbal medications, fish oil, and all vitamins  Continue all other medications as instructed by your physician except follow the above medication instructions before surgery   Do not wear jewelry, make-up or nail polish.  Do not wear lotions, powders, or perfumes, or deoderant.  Do not shave 48 hours prior to surgery.  Do not bring valuables to the hospital.  [facility] is not responsible for any belongings or valuables.  Contacts, dentures or bridgework may not be worn into surgery.  Leave your suitcase in the car.  After surgery it may be brought to your room.  For patients admitted to the hospital, discharge time will be determined by your treatment team.  Patients discharged the day of surgery will not be allowed to drive home.   Special  instructions:   [facility]- Preparing For Surgery  Before surgery, you can play an important role. Because skin is not sterile, your skin needs to be as free of germs as possible. You can reduce the number of germs on your skin by washing with CHG (chlorahexidine gluconate) Soap before surgery.  CHG is an antiseptic cleaner which kills germs and bonds with the skin to continue killing germs even after washing.  Please do not use if you have an allergy to CHG or antibacterial soaps. If your skin becomes reddened/irritated stop using the CHG.  Do not shave (including legs and underarms) for at least 48 hours prior to first CHG shower. It is OK to shave your face.  Please follow these instructions carefully.   1. Shower the NIGHT BEFORE SURGERY and the MORNING OF SURGERY with CHG.   2. If you chose to wash your hair, wash your hair first as usual with your normal shampoo.  3. After you shampoo, rinse your hair and body thoroughly to remove the shampoo.  4. Use CHG as you would any other liquid soap. You can apply CHG directly to the skin and wash gently with a scrungie or a clean washcloth.   5. Apply the CHG Soap to your body ONLY FROM THE NECK DOWN.  Do not use on open wounds or open sores. Avoid contact with your eyes, ears, mouth and genitals (private parts). Wash Face and genitals (private parts)  with your normal soap.  6. Wash thoroughly, paying special attention to the area where your surgery  will be performed.  7. Thoroughly rinse your body with warm water from the neck down.  8. DO NOT shower/wash with your normal soap after using and rinsing off the CHG Soap.  9. Pat yourself dry with a CLEAN TOWEL.  10. Wear CLEAN PAJAMAS to bed the night before surgery, wear comfortable clothes the morning of surgery  11. Place CLEAN SHEETS on your bed the night of your first shower and DO NOT SLEEP WITH PETS.    Day of Surgery: Do not apply any deodorants/lotions. Please wear clean  clothes to the hospital/surgery center.     Please read over the following fact sheets that you were given. Pain Booklet, Coughing and Deep Breathing, MRSA Information and Surgical Site Infection Prevention

## 2017-08-10 ENCOUNTER — Encounter (HOSPITAL_COMMUNITY): Payer: Self-pay

## 2017-08-10 ENCOUNTER — Other Ambulatory Visit (HOSPITAL_COMMUNITY): Payer: Self-pay | Admitting: *Deleted

## 2017-08-10 ENCOUNTER — Ambulatory Visit (HOSPITAL_COMMUNITY)
Admission: RE | Admit: 2017-08-10 | Discharge: 2017-08-10 | Disposition: A | Discharge status: Home / Self Care | Payer: PPO | Source: Ambulatory Visit | Attending: Physician Assistant | Admitting: Physician Assistant

## 2017-08-10 ENCOUNTER — Encounter (HOSPITAL_COMMUNITY)
Admission: RE | Admit: 2017-08-10 | Discharge: 2017-08-10 | Disposition: A | Discharge status: Home / Self Care | Payer: PPO | Source: Ambulatory Visit | Attending: Orthopedic Surgery | Admitting: Orthopedic Surgery

## 2017-08-10 DIAGNOSIS — R0602 Shortness of breath: Secondary | ICD-10-CM | POA: Diagnosis not present

## 2017-08-10 DIAGNOSIS — Z01812 Encounter for preprocedural laboratory examination: Secondary | ICD-10-CM | POA: Diagnosis not present

## 2017-08-10 DIAGNOSIS — R05 Cough: Secondary | ICD-10-CM | POA: Diagnosis not present

## 2017-08-10 DIAGNOSIS — Z01810 Encounter for preprocedural cardiovascular examination: Secondary | ICD-10-CM | POA: Insufficient documentation

## 2017-08-10 DIAGNOSIS — R918 Other nonspecific abnormal finding of lung field: Secondary | ICD-10-CM | POA: Insufficient documentation

## 2017-08-10 HISTORY — DX: Malignant (primary) neoplasm, unspecified: C80.1

## 2017-08-10 HISTORY — DX: Gastro-esophageal reflux disease without esophagitis: K21.9

## 2017-08-10 HISTORY — DX: Pneumonia, unspecified organism: J18.9

## 2017-08-10 HISTORY — DX: Personal history of other diseases of the digestive system: Z87.19

## 2017-08-10 HISTORY — DX: Dyspnea, unspecified: R06.00

## 2017-08-10 LAB — CBC WITH DIFFERENTIAL/PLATELET
Basophils Absolute: 0 10*3/uL (ref 0.0–0.1)
Basophils Relative: 0 %
Eosinophils Absolute: 0.1 10*3/uL (ref 0.0–0.7)
Eosinophils Relative: 1 %
HCT: 40.5 % (ref 36.0–46.0)
Hemoglobin: 12.6 g/dL (ref 12.0–15.0)
Lymphocytes Relative: 31 %
Lymphs Abs: 2.4 10*3/uL (ref 0.7–4.0)
MCH: 29 pg (ref 26.0–34.0)
MCHC: 31.1 g/dL (ref 30.0–36.0)
MCV: 93.3 fL (ref 78.0–100.0)
Monocytes Absolute: 0.7 10*3/uL (ref 0.1–1.0)
Monocytes Relative: 9 %
Neutro Abs: 4.5 10*3/uL (ref 1.7–7.7)
Neutrophils Relative %: 59 %
Platelets: 177 10*3/uL (ref 150–400)
RBC: 4.34 MIL/uL (ref 3.87–5.11)
RDW: 13.7 % (ref 11.5–15.5)
WBC: 7.7 10*3/uL (ref 4.0–10.5)

## 2017-08-10 LAB — COMPREHENSIVE METABOLIC PANEL
ALT: 16 U/L (ref 14–54)
AST: 24 U/L (ref 15–41)
Albumin: 3.9 g/dL (ref 3.5–5.0)
Alkaline Phosphatase: 63 U/L (ref 38–126)
Anion gap: 9 (ref 5–15)
BUN: 20 mg/dL (ref 6–20)
CO2: 24 mmol/L (ref 22–32)
Calcium: 9.3 mg/dL (ref 8.9–10.3)
Chloride: 103 mmol/L (ref 101–111)
Creatinine, Ser: 1.29 mg/dL — ABNORMAL HIGH (ref 0.44–1.00)
GFR calc Af Amer: 43 mL/min — ABNORMAL LOW (ref >60)
GFR calc non Af Amer: 37 mL/min — ABNORMAL LOW (ref >60)
Glucose, Bld: 107 mg/dL — ABNORMAL HIGH (ref 65–99)
Potassium: 4 mmol/L (ref 3.5–5.1)
Sodium: 136 mmol/L (ref 135–145)
Total Bilirubin: 1 mg/dL (ref 0.3–1.2)
Total Protein: 7.5 g/dL (ref 6.5–8.1)

## 2017-08-10 LAB — APTT: aPTT: 42 seconds — ABNORMAL HIGH (ref 24–36)

## 2017-08-10 LAB — PROTIME-INR
INR: 2.46
Prothrombin Time: 26.5 seconds — ABNORMAL HIGH (ref 11.4–15.2)

## 2017-08-10 LAB — TYPE AND SCREEN
ABO/RH(D): A POS
Antibody Screen: NEGATIVE

## 2017-08-10 LAB — SURGICAL PCR SCREEN
MRSA, PCR: POSITIVE — AB
Staphylococcus aureus: POSITIVE — AB

## 2017-08-10 NOTE — Pre-Procedure Instructions (Signed)
Sara Rodriguez  08/10/2017    Your procedure is scheduled on Monday, August 21, 2017 at 9:45 AM.   Report to [facility] Entrance "A" Admitting Office at 7:45 AM.   Call this number if you have problems the morning of surgery: [phone]   Questions prior to day of surgery, please call [phone] between 8 & 4 PM.   Remember:  Do not eat food or drink liquids after midnight Sunday, 08/20/17.  Take these medicines the morning of surgery with A SIP OF WATER: Amlodipine (Norvasc), Omeprazole (Prilosec), Tylenol - if needed.  Stop coumadin as instructed by your surgeon.  Stop Fish Oil, NSAIDS (Ibuprofen, Aleve, Diclofenac, etc) 7 days prior to surgery. Do not use Aspirin products 7 days prior to surgery.   Do not wear jewelry, make-up or nail polish.  Do not wear lotions, powders, perfumes or deodorant.  Do not shave 48 hours prior to surgery.  Do not bring valuables to the hospital.  [facility] is not responsible for any belongings or valuables.  Contacts, dentures or bridgework may not be worn into surgery.  Leave your suitcase in the car.  After surgery it may be brought to your room.  For patients admitted to the hospital, discharge time will be determined by your treatment team.  Patients discharged the day of surgery will not be allowed to drive home.   [place]- Preparing For Surgery  Before surgery, you can play an important role. Because skin is not sterile, your skin needs to be as free of germs as possible. You can reduce the number of germs on your skin by washing with CHG (chlorahexidine gluconate) Soap before surgery.  CHG is an antiseptic cleaner which kills germs and bonds with the skin to continue killing germs even after washing.  Please do not use if you have an allergy to CHG or antibacterial soaps. If your skin becomes reddened/irritated stop using the CHG.  Do not shave (including legs and underarms) for at least 48 hours prior to first CHG  shower. It is OK to shave your face.  Please follow these instructions carefully.   1. Shower the NIGHT BEFORE SURGERY and the MORNING OF SURGERY with CHG.   2. If you chose to wash your hair, wash your hair first as usual with your normal shampoo.  3. After you shampoo, rinse your hair and body thoroughly to remove the shampoo.  4. Use CHG as you would any other liquid soap. You can apply CHG directly to the skin and wash gently with a scrungie or a clean washcloth.   5. Apply the CHG Soap to your body ONLY FROM THE NECK DOWN.  Do not use on open wounds or open sores. Avoid contact with your eyes, ears, mouth and genitals (private parts). Wash Face and genitals (private parts)  with your normal soap.  6. Wash thoroughly, paying special attention to the area where your surgery will be performed.  7. Thoroughly rinse your body with warm water from the neck down.  8. DO NOT shower/wash with your normal soap after using and rinsing off the CHG Soap.  9. Pat yourself dry with a CLEAN TOWEL.  10. Wear CLEAN PAJAMAS to bed the night before surgery, wear comfortable clothes the morning of surgery  11. Place CLEAN SHEETS on your bed the night of your first shower and DO NOT SLEEP WITH PETS.    Day of Surgery: Shower as above. Do not apply any deodorants/lotions.  Please wear clean clothes to the hospital.     Please read over the fact sheets that you were given.

## 2017-08-10 NOTE — Progress Notes (Signed)
Pt sees Dr. Gwenlyn Found for HTN and high cholesterol. Denies cardiac history. Has cardiac clearance. Pt states she is not diabetic. Pt is on Coumadin for hx of Pulmonary Embolism. Pt's daughter states Dr. Archie Endo office instructed her to call pt's PCP (who follows her Coumadin) to find out when to stop it prior to surgery. She states she will be calling today.

## 2017-08-10 NOTE — Progress Notes (Signed)
Mupirocin Ointment Rx called into [facility] on [address] and [address] for positive PCR of MRSA and Staph. I notified pt's daughter, Sara Rodriguez of the results and the need to pick up Rx. She voiced understanding.

## 2017-08-10 NOTE — Pre-Procedure Instructions (Signed)
Sara Rodriguez  08/10/2017    Your procedure is scheduled on Monday, August 21, 2017 at 9:45 AM.   Report to [facility] Entrance "A" Admitting Office at 7:45 AM.   Call this number if you have problems the morning of surgery: [phone]   Questions prior to day of surgery, please call [phone] between 8 & 4 PM.   Remember:  Do not eat food or drink liquids after midnight Sunday, 08/20/17.  Take these medicines the morning of surgery with A SIP OF WATER: Amlodipine (Norvasc), Omeprazole (Prilosec), Tylenol - if needed.  Stop coumadin as instructed by your surgeon.  Stop Fish Oil, NSAIDS (Ibuprofen, Aleve, Diclofenac, etc) 7 days prior to surgery. Do not use Aspirin products 7 days prior to surgery.   Do not wear jewelry, make-up or nail polish.  Do not wear lotions, powders, perfumes or deodorant.  Do not shave 48 hours prior to surgery.  Do not bring valuables to the hospital.  [facility] is not responsible for any belongings or valuables.  Contacts, dentures or bridgework may not be worn into surgery.  Leave your suitcase in the car.  After surgery it may be brought to your room.  For patients admitted to the hospital, discharge time will be determined by your treatment team.  Patients discharged the day of surgery will not be allowed to drive home.   [place]- Preparing For Surgery  Before surgery, you can play an important role. Because skin is not sterile, your skin needs to be as free of germs as possible. You can reduce the number of germs on your skin by washing with CHG (chlorahexidine gluconate) Soap before surgery.  CHG is an antiseptic cleaner which kills germs and bonds with the skin to continue killing germs even after washing.  Please do not use if you have an allergy to CHG or antibacterial soaps. If your skin becomes reddened/irritated stop using the CHG.  Do not shave (including legs and underarms) for at least 48 hours prior to first CHG  shower. It is OK to shave your face.  Please follow these instructions carefully.   1. Shower the NIGHT BEFORE SURGERY and the MORNING OF SURGERY with CHG.   2. If you chose to wash your hair, wash your hair first as usual with your normal shampoo.  3. After you shampoo, rinse your hair and body thoroughly to remove the shampoo.  4. Use CHG as you would any other liquid soap. You can apply CHG directly to the skin and wash gently with a scrungie or a clean washcloth.   5. Apply the CHG Soap to your body ONLY FROM THE NECK DOWN.  Do not use on open wounds or open sores. Avoid contact with your eyes, ears, mouth and genitals (private parts). Wash Face and genitals (private parts)  with your normal soap.  6. Wash thoroughly, paying special attention to the area where your surgery will be performed.  7. Thoroughly rinse your body with warm water from the neck down.  8. DO NOT shower/wash with your normal soap after using and rinsing off the CHG Soap.  9. Pat yourself dry with a CLEAN TOWEL.  10. Wear CLEAN PAJAMAS to bed the night before surgery, wear comfortable clothes the morning of surgery  11. Place CLEAN SHEETS on your bed the night of your first shower and DO NOT SLEEP WITH PETS.  Day of Surgery: Shower as above. Do not apply any deodorants/lotions. Please wear  clean clothes to the hospital.     Please read over the fact sheets that you were given.

## 2017-08-11 LAB — URINE CULTURE

## 2017-08-14 DIAGNOSIS — Z7901 Long term (current) use of anticoagulants: Secondary | ICD-10-CM | POA: Diagnosis not present

## 2017-08-21 DIAGNOSIS — Z7901 Long term (current) use of anticoagulants: Secondary | ICD-10-CM | POA: Diagnosis not present

## 2017-09-04 DIAGNOSIS — Z7901 Long term (current) use of anticoagulants: Secondary | ICD-10-CM | POA: Diagnosis not present

## 2017-09-06 ENCOUNTER — Other Ambulatory Visit (HOSPITAL_COMMUNITY): Payer: PPO

## 2017-09-08 ENCOUNTER — Other Ambulatory Visit (HOSPITAL_COMMUNITY): Payer: PPO

## 2017-09-11 DIAGNOSIS — Z7901 Long term (current) use of anticoagulants: Secondary | ICD-10-CM | POA: Diagnosis not present

## 2017-09-13 ENCOUNTER — Other Ambulatory Visit: Payer: Self-pay | Admitting: Internal Medicine

## 2017-09-13 ENCOUNTER — Ambulatory Visit
Admission: RE | Admit: 2017-09-13 | Discharge: 2017-09-13 | Disposition: A | Discharge status: Home / Self Care | Payer: PPO | Source: Ambulatory Visit | Attending: Internal Medicine | Admitting: Internal Medicine

## 2017-09-13 DIAGNOSIS — R7303 Prediabetes: Secondary | ICD-10-CM | POA: Diagnosis not present

## 2017-09-13 DIAGNOSIS — Z13820 Encounter for screening for osteoporosis: Secondary | ICD-10-CM | POA: Diagnosis not present

## 2017-09-13 DIAGNOSIS — R0602 Shortness of breath: Secondary | ICD-10-CM | POA: Diagnosis not present

## 2017-09-13 DIAGNOSIS — I10 Essential (primary) hypertension: Secondary | ICD-10-CM | POA: Diagnosis not present

## 2017-09-13 DIAGNOSIS — Z23 Encounter for immunization: Secondary | ICD-10-CM | POA: Diagnosis not present

## 2017-09-13 DIAGNOSIS — N183 Chronic kidney disease, stage 3 (moderate): Secondary | ICD-10-CM | POA: Diagnosis not present

## 2017-09-13 DIAGNOSIS — E669 Obesity, unspecified: Secondary | ICD-10-CM | POA: Diagnosis not present

## 2017-09-13 DIAGNOSIS — Z0000 Encounter for general adult medical examination without abnormal findings: Secondary | ICD-10-CM | POA: Diagnosis not present

## 2017-09-13 DIAGNOSIS — Z7189 Other specified counseling: Secondary | ICD-10-CM | POA: Diagnosis not present

## 2017-09-13 DIAGNOSIS — M1990 Unspecified osteoarthritis, unspecified site: Secondary | ICD-10-CM | POA: Diagnosis not present

## 2017-09-13 DIAGNOSIS — I2699 Other pulmonary embolism without acute cor pulmonale: Secondary | ICD-10-CM | POA: Diagnosis not present

## 2017-09-13 DIAGNOSIS — M810 Age-related osteoporosis without current pathological fracture: Secondary | ICD-10-CM | POA: Diagnosis not present

## 2017-09-13 DIAGNOSIS — I82402 Acute embolism and thrombosis of unspecified deep veins of left lower extremity: Secondary | ICD-10-CM | POA: Diagnosis not present

## 2017-09-13 DIAGNOSIS — Z1389 Encounter for screening for other disorder: Secondary | ICD-10-CM | POA: Diagnosis not present

## 2017-09-13 DIAGNOSIS — R600 Localized edema: Secondary | ICD-10-CM | POA: Diagnosis not present

## 2017-09-13 DIAGNOSIS — E782 Mixed hyperlipidemia: Secondary | ICD-10-CM | POA: Diagnosis not present

## 2017-09-13 DIAGNOSIS — K219 Gastro-esophageal reflux disease without esophagitis: Secondary | ICD-10-CM | POA: Diagnosis not present

## 2017-09-13 DIAGNOSIS — E041 Nontoxic single thyroid nodule: Secondary | ICD-10-CM | POA: Diagnosis not present

## 2017-09-22 DIAGNOSIS — H2512 Age-related nuclear cataract, left eye: Secondary | ICD-10-CM | POA: Diagnosis not present

## 2017-09-22 DIAGNOSIS — H2513 Age-related nuclear cataract, bilateral: Secondary | ICD-10-CM | POA: Diagnosis not present

## 2017-09-22 DIAGNOSIS — H353132 Nonexudative age-related macular degeneration, bilateral, intermediate dry stage: Secondary | ICD-10-CM | POA: Diagnosis not present

## 2017-09-22 DIAGNOSIS — H40033 Anatomical narrow angle, bilateral: Secondary | ICD-10-CM | POA: Diagnosis not present

## 2017-09-22 DIAGNOSIS — H35373 Puckering of macula, bilateral: Secondary | ICD-10-CM | POA: Diagnosis not present

## 2017-09-29 DIAGNOSIS — I10 Essential (primary) hypertension: Secondary | ICD-10-CM | POA: Diagnosis not present

## 2017-10-13 DIAGNOSIS — I10 Essential (primary) hypertension: Secondary | ICD-10-CM | POA: Diagnosis not present

## 2017-10-23 DIAGNOSIS — H2513 Age-related nuclear cataract, bilateral: Secondary | ICD-10-CM | POA: Diagnosis not present

## 2017-10-23 DIAGNOSIS — H35371 Puckering of macula, right eye: Secondary | ICD-10-CM | POA: Diagnosis not present

## 2017-10-23 DIAGNOSIS — H35372 Puckering of macula, left eye: Secondary | ICD-10-CM | POA: Diagnosis not present

## 2017-11-01 DIAGNOSIS — M1712 Unilateral primary osteoarthritis, left knee: Secondary | ICD-10-CM | POA: Diagnosis not present

## 2017-11-01 NOTE — Pre-Procedure Instructions (Signed)
Sara Rodriguez  11/01/2017      [facility], [place] [address] Phone: [phone] Fax: [phone]  [facility] [place], [place] [address] Phone: [phone] Fax: [phone]  [facility] [place], [place] - [address] AT [address] [place] [address] Phone: [phone] Fax: [phone]    Your procedure is scheduled on November 13, 2017.  Report to [facility] Admitting at 745 AM.  Call this number if you have problems the morning of surgery:  [phone]   Remember:  Do not eat food or drink liquids after midnight.  Take these medicines the morning of surgery with A SIP OF WATER acetaminophen (tylenol)-if needed, amlodipine (norvasc), omeprazole (prilosec), eye drops-if needed.  Stop your coumadin as instructed by your physician  7 days prior to surgery STOP taking any diclofenac (voltaren) gel,  Aspirin (unless otherwise instructed by your surgeon), Aleve, Naproxen, Ibuprofen, Motrin, Advil, Goody's, BC's, all herbal medications, fish oil, and all vitamins  Continue all other medications as instructed by your physician except follow the above medication instructions before surgery   Do not wear jewelry, make-up or nail polish.  Do not wear lotions, powders, or perfumes, or deodorant.  Do not shave 48 hours prior to surgery.  Men may shave face and neck.  Do not bring valuables to the hospital.  [facility] is not responsible for any belongings or valuables.  Contacts, dentures or bridgework may not be worn into surgery.  Leave your suitcase in the car.  After surgery it may be brought to your room.  For patients admitted to the hospital, discharge time will be determined by your treatment team.  Patients discharged the day  of surgery will not be allowed to drive home.   Special instructions:   [place]- Preparing For Surgery  Before surgery, you can play an important role. Because skin is not sterile, your skin needs to be as free of germs as possible. You can reduce the number of germs on your skin by washing with CHG (chlorahexidine gluconate) Soap before surgery.  CHG is an antiseptic cleaner which kills germs and bonds with the skin to continue killing germs even after washing.  Please do not use if you have an allergy to CHG or antibacterial soaps. If your skin becomes reddened/irritated stop using the CHG.  Do not shave (including legs and underarms) for at least 48 hours prior to first CHG shower. It is OK to shave your face.  Please follow these instructions carefully.   1. Shower the NIGHT BEFORE SURGERY and the MORNING OF SURGERY with CHG.   2. If you chose to wash your hair, wash your hair first as usual with your normal shampoo.  3. After you shampoo, rinse your hair and body thoroughly to remove the shampoo.  4. Use CHG as you would any other liquid soap. You can apply CHG directly to the skin and wash gently with a scrungie or a clean washcloth.   5. Apply the CHG Soap to your body ONLY FROM THE NECK DOWN.  Do not use on open wounds or open sores. Avoid contact with your eyes, ears, mouth and genitals (private parts). Wash Face and genitals (private parts)  with your normal soap.  6.  Wash thoroughly, paying special attention to the area where your surgery will be performed.  7. Thoroughly rinse your body with warm water from the neck down.  8. DO NOT shower/wash with your normal soap after using and rinsing off the CHG Soap.  9. Pat yourself dry with a CLEAN TOWEL.  10. Wear CLEAN PAJAMAS to bed the night before surgery, wear comfortable clothes the morning of surgery  11. Place CLEAN SHEETS on your bed the night of your first shower and DO NOT SLEEP WITH PETS.  Day of Surgery: Do  not apply any deodorants/lotions. Please wear clean clothes to the hospital/surgery center.    Please read over the following fact sheets that you were given. Pain Booklet, Coughing and Deep Breathing and MRSA Information

## 2017-11-02 ENCOUNTER — Inpatient Hospital Stay (HOSPITAL_COMMUNITY)
Admission: RE | Admit: 2017-11-02 | Discharge: 2017-11-02 | Disposition: A | Discharge status: Home / Self Care | Payer: PPO | Source: Ambulatory Visit

## 2017-11-02 DIAGNOSIS — H35373 Puckering of macula, bilateral: Secondary | ICD-10-CM | POA: Diagnosis not present

## 2017-11-02 DIAGNOSIS — H40033 Anatomical narrow angle, bilateral: Secondary | ICD-10-CM | POA: Diagnosis not present

## 2017-11-02 DIAGNOSIS — H2511 Age-related nuclear cataract, right eye: Secondary | ICD-10-CM | POA: Diagnosis not present

## 2017-11-02 DIAGNOSIS — H353132 Nonexudative age-related macular degeneration, bilateral, intermediate dry stage: Secondary | ICD-10-CM | POA: Diagnosis not present

## 2017-11-02 DIAGNOSIS — H2512 Age-related nuclear cataract, left eye: Secondary | ICD-10-CM | POA: Diagnosis not present

## 2017-11-13 ENCOUNTER — Encounter (HOSPITAL_COMMUNITY): Admission: RE | Payer: Self-pay | Source: Ambulatory Visit

## 2017-11-13 ENCOUNTER — Inpatient Hospital Stay (HOSPITAL_COMMUNITY): Admission: RE | Admit: 2017-11-13 | Payer: PPO | Source: Ambulatory Visit | Admitting: Orthopedic Surgery

## 2017-11-13 HISTORY — DX: Unilateral primary osteoarthritis, left knee: M17.12

## 2017-11-13 HISTORY — DX: Activated protein C resistance: D68.51

## 2017-11-13 SURGERY — ARTHROPLASTY, KNEE, TOTAL
Anesthesia: Spinal | Site: Knee | Laterality: Left

## 2017-11-15 DIAGNOSIS — H2512 Age-related nuclear cataract, left eye: Secondary | ICD-10-CM | POA: Diagnosis not present

## 2017-11-15 DIAGNOSIS — H25812 Combined forms of age-related cataract, left eye: Secondary | ICD-10-CM | POA: Diagnosis not present

## 2017-11-20 DIAGNOSIS — M1711 Unilateral primary osteoarthritis, right knee: Secondary | ICD-10-CM | POA: Diagnosis not present

## 2017-11-29 DIAGNOSIS — H35372 Puckering of macula, left eye: Secondary | ICD-10-CM | POA: Diagnosis not present

## 2017-12-07 DIAGNOSIS — H35372 Puckering of macula, left eye: Secondary | ICD-10-CM | POA: Diagnosis not present

## 2017-12-07 DIAGNOSIS — Z09 Encounter for follow-up examination after completed treatment for conditions other than malignant neoplasm: Secondary | ICD-10-CM | POA: Diagnosis not present

## 2018-01-23 DIAGNOSIS — H33102 Unspecified retinoschisis, left eye: Secondary | ICD-10-CM | POA: Diagnosis not present

## 2018-01-23 DIAGNOSIS — H35372 Puckering of macula, left eye: Secondary | ICD-10-CM | POA: Diagnosis not present

## 2018-01-23 DIAGNOSIS — M810 Age-related osteoporosis without current pathological fracture: Secondary | ICD-10-CM | POA: Diagnosis not present

## 2018-01-23 DIAGNOSIS — Z09 Encounter for follow-up examination after completed treatment for conditions other than malignant neoplasm: Secondary | ICD-10-CM | POA: Diagnosis not present

## 2018-01-27 ENCOUNTER — Encounter (HOSPITAL_COMMUNITY): Payer: Self-pay | Admitting: *Deleted

## 2018-01-27 ENCOUNTER — Ambulatory Visit (INDEPENDENT_AMBULATORY_CARE_PROVIDER_SITE_OTHER): Payer: PPO

## 2018-01-27 ENCOUNTER — Ambulatory Visit (HOSPITAL_COMMUNITY)
Admission: EM | Admit: 2018-01-27 | Discharge: 2018-01-27 | Disposition: A | Discharge status: Home / Self Care | Payer: PPO | Attending: Family Medicine | Admitting: Family Medicine

## 2018-01-27 DIAGNOSIS — S82892A Other fracture of left lower leg, initial encounter for closed fracture: Secondary | ICD-10-CM

## 2018-01-27 DIAGNOSIS — S8262XA Displaced fracture of lateral malleolus of left fibula, initial encounter for closed fracture: Secondary | ICD-10-CM | POA: Diagnosis not present

## 2018-01-27 MED ORDER — HYDROCODONE-ACETAMINOPHEN 5-325 MG PO TABS: 2.0000 | ORAL_TABLET | ORAL | 0 refills | Status: DC | PRN

## 2018-01-27 NOTE — ED Notes (Signed)
Cam walker applied to patient left ankle. Per tolerated and verbalized/ showed understanding.

## 2018-01-27 NOTE — ED Provider Notes (Addendum)
[facility]    CSN: [id number] Arrival date & time: 01/27/18  1245     History   Chief Complaint Chief Complaint  Patient presents with  . Ankle Injury    HPI Sara Rodriguez is a 84 y.o. female.   Patient was mowing her lawn earlier this week and slipped injuring her ankle.  She has been ambulating all week but with increasing pain and swelling especially over the lateral aspect of the left ankle.  She does have a history of osteoporosis and takes calcium and vitamin D and some bisphosphonate.  There is also a history of pulmonary embolus and she is on Eliquis. HPI  Past Medical History:  Diagnosis Date  . Cancer ([place])    skin cancer  . Dyspnea   . Factor V Leiden ([place])    pulmonary embolism 2011  . GERD (gastroesophageal reflux disease)   . History of hiatal hernia   . Hyperlipidemia   . Hypertension   . Pneumonia   . Primary localized osteoarthritis of left knee   . Pulmonary embolism [facility]) 2011    Patient Active Problem List   Diagnosis Date Noted  . Primary localized osteoarthritis of left knee   . Hypertension   . Factor V Leiden ([place])   . Essential hypertension 03/25/2016  . Hyperlipidemia 03/25/2016  . Dyspnea on exertion 03/25/2016  . PULMONARY EMBOLISM 01/14/2010  . PLEURAL EFFUSION, RIGHT 01/14/2010  . MASS, LUNG 01/14/2010  . Pulmonary embolism ([place]) 10/17/2009    Past Surgical History:  Procedure Laterality Date  . ABDOMINAL HYSTERECTOMY  1973  . APPENDECTOMY    . CATARACT EXTRACTION    . COLONOSCOPY    . KNEE ARTHROSCOPY Left   . RETINAL LASER PROCEDURE    . VENA CAVA FILTER PLACEMENT      OB History   None      Home Medications    Prior to Admission medications   Medication Sig Start Date End Date Taking? Authorizing Provider  Apixaban (ELIQUIS PO) Take by mouth.   Yes [provider]  atorvastatin (LIPITOR) 10 MG tablet Take 1 tablet by mouth daily. 01/22/16  Yes [provider]  Cholecalciferol  (VITAMIN D) 2000 units CAPS Take 2,000 Units by mouth daily.   Yes [provider]  losartan-hydrochlorothiazide (HYZAAR) 100-12.5 MG tablet Take 1 tablet by mouth daily. 01/22/16  Yes [provider]  METOPROLOL SUCCINATE PO Take by mouth.   Yes [provider]  Omega-3 Fatty Acids (FISH OIL) 1000 MG CAPS Take 1,000 mg by mouth daily.   Yes [provider]  omeprazole (PRILOSEC) 20 MG capsule Take 20 mg by mouth daily.  01/22/16  Yes [provider]  acetaminophen (TYLENOL) 500 MG tablet Take 1,000 mg by mouth every 8 (eight) hours as needed for mild pain or headache.    [provider]  alendronate (FOSAMAX) 70 MG tablet Take 1 tablet by mouth once a week. Sunday 03/22/16   [provider]  amLODipine (NORVASC) 5 MG tablet Take 1 tablet (5 mg total) by mouth daily. Patient taking differently: Take 5 mg by mouth 2 (two) times daily.  07/14/16 08/01/17  Lorretta Harp, MD  diclofenac sodium (VOLTAREN) 1 % GEL APP 2 TO 4 GRAMS 4 TIMES DAILY AS NEEDED FOR PAIN 07/21/17   [provider]  tetrahydrozoline-zinc (VISINE-AC) 0.05-0.25 % ophthalmic solution Place 2 drops into both eyes daily as needed (dry eyes).    [provider]  warfarin (  COUMADIN) 5 MG tablet Take 5-10 mg by mouth daily at 6 PM. Takes 5 mg daily except on Wednesday and fridays and takes 2 tablets (10 mg) 01/23/16   [provider]    Family History Family History  Problem Relation Age of Onset  . Heart disease Mother        heart surgery  . Parkinson's disease Father   . Diabetes Sister   . Diabetes Brother   . Stroke Brother   . Arrhythmia Daughter        afib  . Breast cancer Neg Hx     Social History Social History   Tobacco Use  . Smoking status: Never Smoker  . Smokeless tobacco: Never Used  Substance Use Topics  . Alcohol use: No  . Drug use: No     Allergies   Patient has no known allergies.   Review of Systems Review  of Systems  Musculoskeletal: Positive for arthralgias.       Pain and swelling left ankle and foot  All other systems reviewed and are negative.    Physical Exam Triage Vital Signs ED Triage Vitals  Enc Vitals Group     BP 01/27/18 1301 (!) 168/53     Pulse Rate 01/27/18 1301 62     Resp 01/27/18 1301 18     Temp 01/27/18 1301 97.6 F (36.4 C)     Temp Source 01/27/18 1301 Oral     SpO2 01/27/18 1301 98 %     Weight --      Height --      Head Circumference --      Peak Flow --      Pain Score 01/27/18 1303 8     Pain Loc --      Pain Edu? --      Excl. in [place]? --    No data found.  Updated Vital Signs BP (!) 168/53   Pulse 62   Temp 97.6 F (36.4 C) (Oral)   Resp 18   SpO2 98%   Visual Acuity Right Eye Distance:   Left Eye Distance:   Bilateral Distance:    Right Eye Near:   Left Eye Near:    Bilateral Near:     Physical Exam  Constitutional: She appears well-developed and well-nourished.  Musculoskeletal:  Left ankle: Ecchymosis and 3+ swelling.  Markedly tender over lateral malleolus.  Ankle appears stable.  No tenderness over the medial aspect or posteriorly X-ray confirms nondisplaced fracture of the lateral malleolus.     UC Treatments / Results  Labs (all labs ordered are listed, but only abnormal results are displayed) Labs Reviewed - No data to display  EKG None Radiology Dg Ankle Complete Left  Result Date: 01/27/2018 CLINICAL DATA:  LEFT ankle injury EXAM: LEFT ANKLE COMPLETE - 3+ VIEW COMPARISON:  None. FINDINGS: Slightly displaced fracture of the lateral malleolus. Distal tibia appears intact and normally aligned. Ankle mortise is symmetric. Visualized portions of the hindfoot and midfoot appear intact and normally aligned. Soft tissue swelling. IMPRESSION: 1. Slightly displaced fracture within the upper portion of the lateral malleolus. 2. Soft tissue swelling. Electronically Signed   By: Franki Cabot M.D.   On: 01/27/2018 13:30     Procedures Procedures (including critical care time)  Medications Ordered in UC Medications - No data to display   Initial Impression / Assessment and Plan / UC Course  I have reviewed the triage vital signs and the nursing notes.  Pertinent labs &  imaging results that were available during my care of the patient were reviewed by me and considered in my medical decision making (see chart for details).     Left ankle fracture.  Will place in cam walker and have her follow-up with her orthopedist, Dr. Noemi Chapel.  Will recommend elevation and ice and gentle range of motion. Hydrocodone as needed for pain  Final Clinical Impressions(s) / UC Diagnoses   Final diagnoses:  Closed fracture of left ankle, initial encounter    ED Discharge Orders    None       Controlled Substance Prescriptions [place] Controlled Substance Registry consulted? Yes, I have consulted the [place] Controlled Substances Registry for this patient, and feel the risk/benefit ratio today is favorable for proceeding with this prescription for a controlled substance.   Wardell Honour, MD 01/27/18 1356    Wardell Honour, MD 01/27/18 1357

## 2018-01-27 NOTE — ED Triage Notes (Signed)
Reports slipping while mowing lawn 3 days ago.  C/O continued pain to left lateral ankle.  CMS intact.

## 2018-01-30 DIAGNOSIS — S82832A Other fracture of upper and lower end of left fibula, initial encounter for closed fracture: Secondary | ICD-10-CM | POA: Diagnosis not present

## 2018-02-06 DIAGNOSIS — S82832D Other fracture of upper and lower end of left fibula, subsequent encounter for closed fracture with routine healing: Secondary | ICD-10-CM | POA: Diagnosis not present

## 2018-02-19 DIAGNOSIS — S82832D Other fracture of upper and lower end of left fibula, subsequent encounter for closed fracture with routine healing: Secondary | ICD-10-CM | POA: Diagnosis not present

## 2018-02-27 DIAGNOSIS — N183 Chronic kidney disease, stage 3 (moderate): Secondary | ICD-10-CM | POA: Diagnosis not present

## 2018-02-27 DIAGNOSIS — K219 Gastro-esophageal reflux disease without esophagitis: Secondary | ICD-10-CM | POA: Diagnosis not present

## 2018-02-27 DIAGNOSIS — E782 Mixed hyperlipidemia: Secondary | ICD-10-CM | POA: Diagnosis not present

## 2018-02-27 DIAGNOSIS — R7303 Prediabetes: Secondary | ICD-10-CM | POA: Diagnosis not present

## 2018-02-27 DIAGNOSIS — M810 Age-related osteoporosis without current pathological fracture: Secondary | ICD-10-CM | POA: Diagnosis not present

## 2018-02-27 DIAGNOSIS — I82402 Acute embolism and thrombosis of unspecified deep veins of left lower extremity: Secondary | ICD-10-CM | POA: Diagnosis not present

## 2018-02-27 DIAGNOSIS — I2699 Other pulmonary embolism without acute cor pulmonale: Secondary | ICD-10-CM | POA: Diagnosis not present

## 2018-02-27 DIAGNOSIS — D682 Hereditary deficiency of other clotting factors: Secondary | ICD-10-CM | POA: Diagnosis not present

## 2018-02-27 DIAGNOSIS — I10 Essential (primary) hypertension: Secondary | ICD-10-CM | POA: Diagnosis not present

## 2018-03-05 DIAGNOSIS — S82832D Other fracture of upper and lower end of left fibula, subsequent encounter for closed fracture with routine healing: Secondary | ICD-10-CM | POA: Diagnosis not present

## 2018-03-22 DIAGNOSIS — S82832D Other fracture of upper and lower end of left fibula, subsequent encounter for closed fracture with routine healing: Secondary | ICD-10-CM | POA: Diagnosis not present

## 2018-04-12 DIAGNOSIS — S82832D Other fracture of upper and lower end of left fibula, subsequent encounter for closed fracture with routine healing: Secondary | ICD-10-CM | POA: Diagnosis not present

## 2018-05-07 DIAGNOSIS — H35372 Puckering of macula, left eye: Secondary | ICD-10-CM | POA: Diagnosis not present

## 2018-05-07 DIAGNOSIS — H33102 Unspecified retinoschisis, left eye: Secondary | ICD-10-CM | POA: Diagnosis not present

## 2018-05-07 DIAGNOSIS — H35371 Puckering of macula, right eye: Secondary | ICD-10-CM | POA: Diagnosis not present

## 2018-05-07 DIAGNOSIS — H2511 Age-related nuclear cataract, right eye: Secondary | ICD-10-CM | POA: Diagnosis not present

## 2018-06-16 DIAGNOSIS — M810 Age-related osteoporosis without current pathological fracture: Secondary | ICD-10-CM | POA: Diagnosis not present

## 2018-06-16 DIAGNOSIS — N183 Chronic kidney disease, stage 3 (moderate): Secondary | ICD-10-CM | POA: Diagnosis not present

## 2018-06-16 DIAGNOSIS — E782 Mixed hyperlipidemia: Secondary | ICD-10-CM | POA: Diagnosis not present

## 2018-06-16 DIAGNOSIS — M1990 Unspecified osteoarthritis, unspecified site: Secondary | ICD-10-CM | POA: Diagnosis not present

## 2018-06-16 DIAGNOSIS — I10 Essential (primary) hypertension: Secondary | ICD-10-CM | POA: Diagnosis not present

## 2018-06-27 DIAGNOSIS — M1990 Unspecified osteoarthritis, unspecified site: Secondary | ICD-10-CM | POA: Diagnosis not present

## 2018-06-27 DIAGNOSIS — E782 Mixed hyperlipidemia: Secondary | ICD-10-CM | POA: Diagnosis not present

## 2018-06-27 DIAGNOSIS — M810 Age-related osteoporosis without current pathological fracture: Secondary | ICD-10-CM | POA: Diagnosis not present

## 2018-06-27 DIAGNOSIS — I10 Essential (primary) hypertension: Secondary | ICD-10-CM | POA: Diagnosis not present

## 2018-06-27 DIAGNOSIS — N183 Chronic kidney disease, stage 3 (moderate): Secondary | ICD-10-CM | POA: Diagnosis not present

## 2018-08-15 DIAGNOSIS — I10 Essential (primary) hypertension: Secondary | ICD-10-CM | POA: Diagnosis not present

## 2018-08-15 DIAGNOSIS — E782 Mixed hyperlipidemia: Secondary | ICD-10-CM | POA: Diagnosis not present

## 2018-08-15 DIAGNOSIS — N183 Chronic kidney disease, stage 3 (moderate): Secondary | ICD-10-CM | POA: Diagnosis not present

## 2018-08-15 DIAGNOSIS — M810 Age-related osteoporosis without current pathological fracture: Secondary | ICD-10-CM | POA: Diagnosis not present

## 2018-08-15 DIAGNOSIS — M1990 Unspecified osteoarthritis, unspecified site: Secondary | ICD-10-CM | POA: Diagnosis not present

## 2018-08-30 ENCOUNTER — Other Ambulatory Visit: Payer: Self-pay | Admitting: Internal Medicine

## 2018-08-30 DIAGNOSIS — I10 Essential (primary) hypertension: Secondary | ICD-10-CM | POA: Diagnosis not present

## 2018-08-30 DIAGNOSIS — N183 Chronic kidney disease, stage 3 (moderate): Secondary | ICD-10-CM | POA: Diagnosis not present

## 2018-08-30 DIAGNOSIS — E782 Mixed hyperlipidemia: Secondary | ICD-10-CM | POA: Diagnosis not present

## 2018-08-30 DIAGNOSIS — Z1231 Encounter for screening mammogram for malignant neoplasm of breast: Secondary | ICD-10-CM

## 2018-08-30 DIAGNOSIS — M810 Age-related osteoporosis without current pathological fracture: Secondary | ICD-10-CM | POA: Diagnosis not present

## 2018-08-30 DIAGNOSIS — M1990 Unspecified osteoarthritis, unspecified site: Secondary | ICD-10-CM | POA: Diagnosis not present

## 2018-09-18 DIAGNOSIS — K219 Gastro-esophageal reflux disease without esophagitis: Secondary | ICD-10-CM | POA: Diagnosis not present

## 2018-09-18 DIAGNOSIS — E782 Mixed hyperlipidemia: Secondary | ICD-10-CM | POA: Diagnosis not present

## 2018-09-18 DIAGNOSIS — E041 Nontoxic single thyroid nodule: Secondary | ICD-10-CM | POA: Diagnosis not present

## 2018-09-18 DIAGNOSIS — I2699 Other pulmonary embolism without acute cor pulmonale: Secondary | ICD-10-CM | POA: Diagnosis not present

## 2018-09-18 DIAGNOSIS — R7303 Prediabetes: Secondary | ICD-10-CM | POA: Diagnosis not present

## 2018-09-18 DIAGNOSIS — Z0000 Encounter for general adult medical examination without abnormal findings: Secondary | ICD-10-CM | POA: Diagnosis not present

## 2018-09-18 DIAGNOSIS — D682 Hereditary deficiency of other clotting factors: Secondary | ICD-10-CM | POA: Diagnosis not present

## 2018-09-18 DIAGNOSIS — I10 Essential (primary) hypertension: Secondary | ICD-10-CM | POA: Diagnosis not present

## 2018-09-18 DIAGNOSIS — Z1211 Encounter for screening for malignant neoplasm of colon: Secondary | ICD-10-CM | POA: Diagnosis not present

## 2018-09-18 DIAGNOSIS — I82402 Acute embolism and thrombosis of unspecified deep veins of left lower extremity: Secondary | ICD-10-CM | POA: Diagnosis not present

## 2018-09-18 DIAGNOSIS — N183 Chronic kidney disease, stage 3 (moderate): Secondary | ICD-10-CM | POA: Diagnosis not present

## 2018-09-18 DIAGNOSIS — M810 Age-related osteoporosis without current pathological fracture: Secondary | ICD-10-CM | POA: Diagnosis not present

## 2018-09-18 DIAGNOSIS — Z1389 Encounter for screening for other disorder: Secondary | ICD-10-CM | POA: Diagnosis not present

## 2018-09-18 DIAGNOSIS — D696 Thrombocytopenia, unspecified: Secondary | ICD-10-CM | POA: Diagnosis not present

## 2018-09-20 DIAGNOSIS — Z1211 Encounter for screening for malignant neoplasm of colon: Secondary | ICD-10-CM | POA: Diagnosis not present

## 2018-10-03 DIAGNOSIS — R195 Other fecal abnormalities: Secondary | ICD-10-CM | POA: Diagnosis not present

## 2018-10-04 ENCOUNTER — Other Ambulatory Visit: Payer: Self-pay | Admitting: Gastroenterology

## 2018-10-04 DIAGNOSIS — R195 Other fecal abnormalities: Secondary | ICD-10-CM

## 2018-10-17 DIAGNOSIS — J189 Pneumonia, unspecified organism: Secondary | ICD-10-CM

## 2018-10-17 HISTORY — DX: Pneumonia, unspecified organism: J18.9

## 2018-10-23 ENCOUNTER — Ambulatory Visit: Payer: PPO

## 2018-11-16 DIAGNOSIS — K5730 Diverticulosis of large intestine without perforation or abscess without bleeding: Secondary | ICD-10-CM | POA: Diagnosis not present

## 2018-11-16 DIAGNOSIS — K648 Other hemorrhoids: Secondary | ICD-10-CM | POA: Diagnosis not present

## 2018-11-16 DIAGNOSIS — R195 Other fecal abnormalities: Secondary | ICD-10-CM | POA: Diagnosis not present

## 2018-11-19 DIAGNOSIS — R946 Abnormal results of thyroid function studies: Secondary | ICD-10-CM | POA: Diagnosis not present

## 2018-11-20 ENCOUNTER — Ambulatory Visit
Admission: RE | Admit: 2018-11-20 | Discharge: 2018-11-20 | Disposition: A | Discharge status: Home / Self Care | Payer: PPO | Source: Ambulatory Visit | Attending: Internal Medicine | Admitting: Internal Medicine

## 2018-11-20 DIAGNOSIS — Z1231 Encounter for screening mammogram for malignant neoplasm of breast: Secondary | ICD-10-CM | POA: Diagnosis not present

## 2018-12-03 DIAGNOSIS — I10 Essential (primary) hypertension: Secondary | ICD-10-CM | POA: Diagnosis not present

## 2018-12-03 DIAGNOSIS — N183 Chronic kidney disease, stage 3 (moderate): Secondary | ICD-10-CM | POA: Diagnosis not present

## 2018-12-03 DIAGNOSIS — M1990 Unspecified osteoarthritis, unspecified site: Secondary | ICD-10-CM | POA: Diagnosis not present

## 2018-12-03 DIAGNOSIS — E782 Mixed hyperlipidemia: Secondary | ICD-10-CM | POA: Diagnosis not present

## 2018-12-03 DIAGNOSIS — M810 Age-related osteoporosis without current pathological fracture: Secondary | ICD-10-CM | POA: Diagnosis not present

## 2018-12-24 DIAGNOSIS — M810 Age-related osteoporosis without current pathological fracture: Secondary | ICD-10-CM | POA: Diagnosis not present

## 2018-12-24 DIAGNOSIS — N183 Chronic kidney disease, stage 3 (moderate): Secondary | ICD-10-CM | POA: Diagnosis not present

## 2018-12-24 DIAGNOSIS — E782 Mixed hyperlipidemia: Secondary | ICD-10-CM | POA: Diagnosis not present

## 2018-12-24 DIAGNOSIS — M1990 Unspecified osteoarthritis, unspecified site: Secondary | ICD-10-CM | POA: Diagnosis not present

## 2018-12-24 DIAGNOSIS — I10 Essential (primary) hypertension: Secondary | ICD-10-CM | POA: Diagnosis not present

## 2019-01-21 DIAGNOSIS — I10 Essential (primary) hypertension: Secondary | ICD-10-CM | POA: Diagnosis not present

## 2019-01-21 DIAGNOSIS — E782 Mixed hyperlipidemia: Secondary | ICD-10-CM | POA: Diagnosis not present

## 2019-01-21 DIAGNOSIS — M1990 Unspecified osteoarthritis, unspecified site: Secondary | ICD-10-CM | POA: Diagnosis not present

## 2019-01-21 DIAGNOSIS — N183 Chronic kidney disease, stage 3 (moderate): Secondary | ICD-10-CM | POA: Diagnosis not present

## 2019-01-21 DIAGNOSIS — M810 Age-related osteoporosis without current pathological fracture: Secondary | ICD-10-CM | POA: Diagnosis not present

## 2019-02-22 DIAGNOSIS — E782 Mixed hyperlipidemia: Secondary | ICD-10-CM | POA: Diagnosis not present

## 2019-02-22 DIAGNOSIS — I10 Essential (primary) hypertension: Secondary | ICD-10-CM | POA: Diagnosis not present

## 2019-02-22 DIAGNOSIS — N183 Chronic kidney disease, stage 3 (moderate): Secondary | ICD-10-CM | POA: Diagnosis not present

## 2019-02-22 DIAGNOSIS — M810 Age-related osteoporosis without current pathological fracture: Secondary | ICD-10-CM | POA: Diagnosis not present

## 2019-02-22 DIAGNOSIS — M1990 Unspecified osteoarthritis, unspecified site: Secondary | ICD-10-CM | POA: Diagnosis not present

## 2019-03-20 DIAGNOSIS — E782 Mixed hyperlipidemia: Secondary | ICD-10-CM | POA: Diagnosis not present

## 2019-03-20 DIAGNOSIS — N183 Chronic kidney disease, stage 3 (moderate): Secondary | ICD-10-CM | POA: Diagnosis not present

## 2019-03-20 DIAGNOSIS — D682 Hereditary deficiency of other clotting factors: Secondary | ICD-10-CM | POA: Diagnosis not present

## 2019-03-20 DIAGNOSIS — D696 Thrombocytopenia, unspecified: Secondary | ICD-10-CM | POA: Diagnosis not present

## 2019-03-20 DIAGNOSIS — K219 Gastro-esophageal reflux disease without esophagitis: Secondary | ICD-10-CM | POA: Diagnosis not present

## 2019-03-20 DIAGNOSIS — I82402 Acute embolism and thrombosis of unspecified deep veins of left lower extremity: Secondary | ICD-10-CM | POA: Diagnosis not present

## 2019-03-20 DIAGNOSIS — R7303 Prediabetes: Secondary | ICD-10-CM | POA: Diagnosis not present

## 2019-03-20 DIAGNOSIS — I2699 Other pulmonary embolism without acute cor pulmonale: Secondary | ICD-10-CM | POA: Diagnosis not present

## 2019-03-20 DIAGNOSIS — M810 Age-related osteoporosis without current pathological fracture: Secondary | ICD-10-CM | POA: Diagnosis not present

## 2019-03-20 DIAGNOSIS — I10 Essential (primary) hypertension: Secondary | ICD-10-CM | POA: Diagnosis not present

## 2019-03-21 DIAGNOSIS — I10 Essential (primary) hypertension: Secondary | ICD-10-CM | POA: Diagnosis not present

## 2019-03-21 DIAGNOSIS — M1990 Unspecified osteoarthritis, unspecified site: Secondary | ICD-10-CM | POA: Diagnosis not present

## 2019-03-21 DIAGNOSIS — E782 Mixed hyperlipidemia: Secondary | ICD-10-CM | POA: Diagnosis not present

## 2019-03-21 DIAGNOSIS — M810 Age-related osteoporosis without current pathological fracture: Secondary | ICD-10-CM | POA: Diagnosis not present

## 2019-03-21 DIAGNOSIS — N183 Chronic kidney disease, stage 3 (moderate): Secondary | ICD-10-CM | POA: Diagnosis not present

## 2019-04-23 DIAGNOSIS — E782 Mixed hyperlipidemia: Secondary | ICD-10-CM | POA: Diagnosis not present

## 2019-04-23 DIAGNOSIS — I10 Essential (primary) hypertension: Secondary | ICD-10-CM | POA: Diagnosis not present

## 2019-04-23 DIAGNOSIS — M1990 Unspecified osteoarthritis, unspecified site: Secondary | ICD-10-CM | POA: Diagnosis not present

## 2019-04-23 DIAGNOSIS — M810 Age-related osteoporosis without current pathological fracture: Secondary | ICD-10-CM | POA: Diagnosis not present

## 2019-04-23 DIAGNOSIS — N183 Chronic kidney disease, stage 3 (moderate): Secondary | ICD-10-CM | POA: Diagnosis not present

## 2019-05-28 DIAGNOSIS — M810 Age-related osteoporosis without current pathological fracture: Secondary | ICD-10-CM | POA: Diagnosis not present

## 2019-05-28 DIAGNOSIS — M1990 Unspecified osteoarthritis, unspecified site: Secondary | ICD-10-CM | POA: Diagnosis not present

## 2019-05-28 DIAGNOSIS — E782 Mixed hyperlipidemia: Secondary | ICD-10-CM | POA: Diagnosis not present

## 2019-05-28 DIAGNOSIS — I10 Essential (primary) hypertension: Secondary | ICD-10-CM | POA: Diagnosis not present

## 2019-05-28 DIAGNOSIS — N183 Chronic kidney disease, stage 3 (moderate): Secondary | ICD-10-CM | POA: Diagnosis not present

## 2019-07-04 DIAGNOSIS — M810 Age-related osteoporosis without current pathological fracture: Secondary | ICD-10-CM | POA: Diagnosis not present

## 2019-07-04 DIAGNOSIS — I10 Essential (primary) hypertension: Secondary | ICD-10-CM | POA: Diagnosis not present

## 2019-07-04 DIAGNOSIS — E782 Mixed hyperlipidemia: Secondary | ICD-10-CM | POA: Diagnosis not present

## 2019-07-04 DIAGNOSIS — N183 Chronic kidney disease, stage 3 (moderate): Secondary | ICD-10-CM | POA: Diagnosis not present

## 2019-07-04 DIAGNOSIS — M1990 Unspecified osteoarthritis, unspecified site: Secondary | ICD-10-CM | POA: Diagnosis not present

## 2019-09-26 DIAGNOSIS — Z0000 Encounter for general adult medical examination without abnormal findings: Secondary | ICD-10-CM | POA: Diagnosis not present

## 2019-09-26 DIAGNOSIS — R7309 Other abnormal glucose: Secondary | ICD-10-CM | POA: Diagnosis not present

## 2019-09-26 DIAGNOSIS — K219 Gastro-esophageal reflux disease without esophagitis: Secondary | ICD-10-CM | POA: Diagnosis not present

## 2019-09-26 DIAGNOSIS — M810 Age-related osteoporosis without current pathological fracture: Secondary | ICD-10-CM | POA: Diagnosis not present

## 2019-09-26 DIAGNOSIS — D696 Thrombocytopenia, unspecified: Secondary | ICD-10-CM | POA: Diagnosis not present

## 2019-09-26 DIAGNOSIS — N1831 Chronic kidney disease, stage 3a: Secondary | ICD-10-CM | POA: Diagnosis not present

## 2019-09-26 DIAGNOSIS — E782 Mixed hyperlipidemia: Secondary | ICD-10-CM | POA: Diagnosis not present

## 2019-09-26 DIAGNOSIS — M1990 Unspecified osteoarthritis, unspecified site: Secondary | ICD-10-CM | POA: Diagnosis not present

## 2019-09-26 DIAGNOSIS — I2699 Other pulmonary embolism without acute cor pulmonale: Secondary | ICD-10-CM | POA: Diagnosis not present

## 2019-09-26 DIAGNOSIS — I10 Essential (primary) hypertension: Secondary | ICD-10-CM | POA: Diagnosis not present

## 2019-09-26 DIAGNOSIS — Z1389 Encounter for screening for other disorder: Secondary | ICD-10-CM | POA: Diagnosis not present

## 2019-09-26 DIAGNOSIS — Z23 Encounter for immunization: Secondary | ICD-10-CM | POA: Diagnosis not present

## 2019-09-26 DIAGNOSIS — D682 Hereditary deficiency of other clotting factors: Secondary | ICD-10-CM | POA: Diagnosis not present

## 2019-09-26 DIAGNOSIS — H9190 Unspecified hearing loss, unspecified ear: Secondary | ICD-10-CM | POA: Diagnosis not present

## 2019-11-14 DIAGNOSIS — I10 Essential (primary) hypertension: Secondary | ICD-10-CM | POA: Diagnosis not present

## 2019-11-14 DIAGNOSIS — N1831 Chronic kidney disease, stage 3a: Secondary | ICD-10-CM | POA: Diagnosis not present

## 2019-11-14 DIAGNOSIS — M810 Age-related osteoporosis without current pathological fracture: Secondary | ICD-10-CM | POA: Diagnosis not present

## 2019-11-14 DIAGNOSIS — M1990 Unspecified osteoarthritis, unspecified site: Secondary | ICD-10-CM | POA: Diagnosis not present

## 2019-11-14 DIAGNOSIS — E782 Mixed hyperlipidemia: Secondary | ICD-10-CM | POA: Diagnosis not present

## 2019-12-30 ENCOUNTER — Other Ambulatory Visit: Payer: Self-pay | Admitting: Gastroenterology

## 2019-12-30 DIAGNOSIS — R195 Other fecal abnormalities: Secondary | ICD-10-CM

## 2020-01-01 DIAGNOSIS — Z7901 Long term (current) use of anticoagulants: Secondary | ICD-10-CM | POA: Diagnosis not present

## 2020-01-01 DIAGNOSIS — I10 Essential (primary) hypertension: Secondary | ICD-10-CM | POA: Diagnosis not present

## 2020-01-01 DIAGNOSIS — M7989 Other specified soft tissue disorders: Secondary | ICD-10-CM | POA: Diagnosis not present

## 2020-01-15 DIAGNOSIS — I10 Essential (primary) hypertension: Secondary | ICD-10-CM | POA: Diagnosis not present

## 2020-04-01 DIAGNOSIS — E782 Mixed hyperlipidemia: Secondary | ICD-10-CM | POA: Diagnosis not present

## 2020-04-01 DIAGNOSIS — D696 Thrombocytopenia, unspecified: Secondary | ICD-10-CM | POA: Diagnosis not present

## 2020-04-01 DIAGNOSIS — R5383 Other fatigue: Secondary | ICD-10-CM | POA: Diagnosis not present

## 2020-04-01 DIAGNOSIS — I10 Essential (primary) hypertension: Secondary | ICD-10-CM | POA: Diagnosis not present

## 2020-04-01 DIAGNOSIS — R197 Diarrhea, unspecified: Secondary | ICD-10-CM | POA: Diagnosis not present

## 2020-04-01 DIAGNOSIS — K219 Gastro-esophageal reflux disease without esophagitis: Secondary | ICD-10-CM | POA: Diagnosis not present

## 2020-04-01 DIAGNOSIS — D682 Hereditary deficiency of other clotting factors: Secondary | ICD-10-CM | POA: Diagnosis not present

## 2020-04-01 DIAGNOSIS — N1831 Chronic kidney disease, stage 3a: Secondary | ICD-10-CM | POA: Diagnosis not present

## 2020-04-01 DIAGNOSIS — R7303 Prediabetes: Secondary | ICD-10-CM | POA: Diagnosis not present

## 2020-04-01 DIAGNOSIS — M810 Age-related osteoporosis without current pathological fracture: Secondary | ICD-10-CM | POA: Diagnosis not present

## 2020-04-01 DIAGNOSIS — I519 Heart disease, unspecified: Secondary | ICD-10-CM | POA: Diagnosis not present

## 2020-04-01 DIAGNOSIS — I2699 Other pulmonary embolism without acute cor pulmonale: Secondary | ICD-10-CM | POA: Diagnosis not present

## 2020-05-04 DIAGNOSIS — D508 Other iron deficiency anemias: Secondary | ICD-10-CM | POA: Diagnosis not present

## 2020-06-17 DIAGNOSIS — K219 Gastro-esophageal reflux disease without esophagitis: Secondary | ICD-10-CM | POA: Diagnosis not present

## 2020-07-20 DIAGNOSIS — K219 Gastro-esophageal reflux disease without esophagitis: Secondary | ICD-10-CM | POA: Diagnosis not present

## 2020-07-20 DIAGNOSIS — D649 Anemia, unspecified: Secondary | ICD-10-CM | POA: Diagnosis not present

## 2020-07-20 DIAGNOSIS — Z23 Encounter for immunization: Secondary | ICD-10-CM | POA: Diagnosis not present

## 2020-07-20 DIAGNOSIS — R531 Weakness: Secondary | ICD-10-CM | POA: Diagnosis not present

## 2020-09-20 ENCOUNTER — Ambulatory Visit (INDEPENDENT_AMBULATORY_CARE_PROVIDER_SITE_OTHER): Payer: PPO

## 2020-09-20 ENCOUNTER — Other Ambulatory Visit: Payer: Self-pay

## 2020-09-20 ENCOUNTER — Ambulatory Visit (HOSPITAL_COMMUNITY)
Admission: EM | Admit: 2020-09-20 | Discharge: 2020-09-20 | Disposition: A | Discharge status: Home / Self Care | Payer: PPO | Attending: Emergency Medicine | Admitting: Emergency Medicine

## 2020-09-20 ENCOUNTER — Encounter (HOSPITAL_COMMUNITY): Payer: Self-pay

## 2020-09-20 DIAGNOSIS — Z7901 Long term (current) use of anticoagulants: Secondary | ICD-10-CM | POA: Insufficient documentation

## 2020-09-20 DIAGNOSIS — R059 Cough, unspecified: Secondary | ICD-10-CM | POA: Diagnosis not present

## 2020-09-20 DIAGNOSIS — Z79899 Other long term (current) drug therapy: Secondary | ICD-10-CM | POA: Insufficient documentation

## 2020-09-20 DIAGNOSIS — R918 Other nonspecific abnormal finding of lung field: Secondary | ICD-10-CM | POA: Diagnosis not present

## 2020-09-20 DIAGNOSIS — R0609 Other forms of dyspnea: Secondary | ICD-10-CM | POA: Insufficient documentation

## 2020-09-20 DIAGNOSIS — R0602 Shortness of breath: Secondary | ICD-10-CM | POA: Diagnosis not present

## 2020-09-20 DIAGNOSIS — Z20822 Contact with and (suspected) exposure to covid-19: Secondary | ICD-10-CM | POA: Insufficient documentation

## 2020-09-20 DIAGNOSIS — J189 Pneumonia, unspecified organism: Secondary | ICD-10-CM | POA: Insufficient documentation

## 2020-09-20 DIAGNOSIS — Z86711 Personal history of pulmonary embolism: Secondary | ICD-10-CM | POA: Diagnosis not present

## 2020-09-20 DIAGNOSIS — I10 Essential (primary) hypertension: Secondary | ICD-10-CM | POA: Insufficient documentation

## 2020-09-20 DIAGNOSIS — R0981 Nasal congestion: Secondary | ICD-10-CM | POA: Insufficient documentation

## 2020-09-20 LAB — POCT URINALYSIS DIPSTICK, ED / UC
Bilirubin Urine: NEGATIVE
Glucose, UA: NEGATIVE mg/dL
Ketones, ur: NEGATIVE mg/dL
Leukocytes,Ua: NEGATIVE
Nitrite: NEGATIVE
Protein, ur: 100 mg/dL — AB
Specific Gravity, Urine: 1.01 (ref 1.005–1.030)
Urobilinogen, UA: 0.2 mg/dL (ref 0.0–1.0)
pH: 5.5 (ref 5.0–8.0)

## 2020-09-20 LAB — RESP PANEL BY RT-PCR (FLU A&B, COVID) ARPGX2
Influenza A by PCR: NEGATIVE
Influenza B by PCR: NEGATIVE
SARS Coronavirus 2 by RT PCR: NEGATIVE

## 2020-09-20 MED ORDER — AZITHROMYCIN 250 MG PO TABS: 250.0000 mg | ORAL_TABLET | Freq: Every day | ORAL | 0 refills | Status: DC

## 2020-09-20 NOTE — ED Triage Notes (Signed)
Pt c/o productive cough with brown sputum, runny nose, subjective fever, fatigue, decreased appetite, change in taste/smell, SOB for approx 1 week. States she initially had sore throat 1 week ago, now resolved.  Also c/o rib pain with coughing. Also reports altered urine output for several days. Had two incidents of incontinence.  Took tylenol at approx 1200.  Has had both COVID vaccines.  Bilateral breath sounds CTA, mild tachypnea noted, respiratory effort increases slightly with speaking.  Denies n/v/d, abdominal pain. States h/o flank/lower back pain.States difficulty taking Rx the past several days 2/2 decreased appetite/food intake.

## 2020-09-20 NOTE — Discharge Instructions (Addendum)
Take the antibiotic as directed.    Go to the emergency department if you have acute shortness of breath or difficulty breathing.    Schedule a follow-up appointment with your primary care provider early this week.

## 2020-09-20 NOTE — ED Provider Notes (Signed)
[place]    CSN: [id number] Arrival date & time: 09/20/20  1551      History   Chief Complaint Chief Complaint  Patient presents with  . Cough  . Nasal Congestion    HPI Genesi Stefanko is a 86 y.o. female.   Accompanied by her daughter, patient presents with 1 week history of, cough productive of brown sputum, shortness of breath, runny nose, fatigue, decreased appetite due to decreased taste.  He states she has coughed so much that her ribs are sore.  She also reports decreased urine output.  She denies vomiting, diarrhea, or other symptoms.  Her medical history includes pulmonary embolism, lung mass, hypertension, dyspnea on exertion.  The history is provided by the patient and a relative.    Past Medical History:  Diagnosis Date  . Cancer ([place])    skin cancer  . Dyspnea   . Factor V Leiden ([place])    pulmonary embolism 2011  . GERD (gastroesophageal reflux disease)   . History of hiatal hernia   . Hyperlipidemia   . Hypertension   . Pneumonia   . Primary localized osteoarthritis of left knee   . Pulmonary embolism [facility]) 2011    Patient Active Problem List   Diagnosis Date Noted  . Primary localized osteoarthritis of left knee   . Hypertension   . Factor V Leiden ([place])   . Essential hypertension 03/25/2016  . Hyperlipidemia 03/25/2016  . Dyspnea on exertion 03/25/2016  . PULMONARY EMBOLISM 01/14/2010  . PLEURAL EFFUSION, RIGHT 01/14/2010  . MASS, LUNG 01/14/2010  . Pulmonary embolism ([place]) 10/17/2009    Past Surgical History:  Procedure Laterality Date  . ABDOMINAL HYSTERECTOMY  1973  . APPENDECTOMY    . CATARACT EXTRACTION    . COLONOSCOPY    . KNEE ARTHROSCOPY Left   . RETINAL LASER PROCEDURE    . VENA CAVA FILTER PLACEMENT      OB History   No obstetric history on file.      Home Medications    Prior to Admission medications   Medication Sig Start Date End Date Taking? Authorizing Provider  acetaminophen (TYLENOL) 500 MG  tablet Take 1,000 mg by mouth every 8 (eight) hours as needed for mild pain or headache.   Yes [provider]  Apixaban (ELIQUIS PO) Take by mouth.   Yes [provider]  hydrochlorothiazide (HYDRODIURIL) 25 MG tablet  06/06/20  Yes [provider]  losartan (COZAAR) 100 MG tablet  06/04/20  Yes [provider]  metoprolol tartrate (LOPRESSOR) 25 MG tablet  06/04/20  Yes [provider]  alendronate (FOSAMAX) 70 MG tablet Take 1 tablet by mouth once a week. Sunday 03/22/16   [provider]  amLODipine (NORVASC) 5 MG tablet Take 1 tablet (5 mg total) by mouth daily. Patient taking differently: Take 5 mg by mouth 2 (two) times daily.  07/14/16 08/01/17  Lorretta Harp, MD  atorvastatin (LIPITOR) 10 MG tablet Take 1 tablet by mouth daily. 01/22/16   [provider]  azithromycin (ZITHROMAX) 250 MG tablet Take 1 tablet (250 mg total) by mouth daily. Take first 2 tablets together, then 1 every day until finished. 09/20/20   Sharion Balloon, NP  Cholecalciferol (VITAMIN D) 2000 units CAPS Take 2,000 Units by mouth daily.    [provider]  diclofenac sodium (VOLTAREN) 1 % GEL APP 2 TO 4 GRAMS 4 TIMES DAILY AS NEEDED FOR PAIN 07/21/17   [provider]  ELIQUIS 5 MG TABS tablet  06/04/20   [provider]  HYDROcodone-acetaminophen (NORCO/VICODIN) 5-325 MG tablet Take 2 tablets by mouth every 4 (four) hours as needed. 01/27/18   Wardell Honour, MD  losartan-hydrochlorothiazide (HYZAAR) 100-12.5 MG tablet Take 1 tablet by mouth daily. 01/22/16   [provider]  METOPROLOL SUCCINATE PO Take by mouth.    [provider]  Omega-3 Fatty Acids (FISH OIL) 1000 MG CAPS Take 1,000 mg by mouth daily.    [provider]  omeprazole (PRILOSEC) 20 MG capsule Take 20 mg by mouth daily.  01/22/16   [provider]  tetrahydrozoline-zinc (VISINE-AC) 0.05-0.25 % ophthalmic solution Place 2 drops into both  eyes daily as needed (dry eyes).    [provider]  warfarin (COUMADIN) 5 MG tablet Take 5-10 mg by mouth daily at 6 PM. Takes 5 mg daily except on Wednesday and fridays and takes 2 tablets (10 mg) 01/23/16   [provider]    Family History Family History  Problem Relation Age of Onset  . Heart disease Mother        heart surgery  . Parkinson's disease Father   . Diabetes Sister   . Diabetes Brother   . Stroke Brother   . Arrhythmia Daughter        afib  . Breast cancer Neg Hx     Social History Social History   Tobacco Use  . Smoking status: Never Smoker  . Smokeless tobacco: Never Used  Vaping Use  . Vaping Use: Never used  Substance Use Topics  . Alcohol use: No  . Drug use: No     Allergies   Patient has no known allergies.   Review of Systems Review of Systems  Constitutional: Positive for appetite change, fatigue and fever. Negative for chills.  HENT: Positive for rhinorrhea. Negative for ear pain and sore throat.   Eyes: Negative for pain and visual disturbance.  Respiratory: Positive for cough and shortness of breath.   Cardiovascular: Negative for chest pain and palpitations.  Gastrointestinal: Negative for abdominal pain, diarrhea and vomiting.  Genitourinary: Negative for dysuria and hematuria.  Musculoskeletal: Negative for arthralgias and back pain.  Skin: Negative for color change and rash.  Neurological: Negative for seizures and syncope.  All other systems reviewed and are negative.    Physical Exam Triage Vital Signs ED Triage Vitals [09/20/20 1648]  Enc Vitals Group     BP      Pulse      Resp      Temp      Temp src      SpO2      Weight      Height      Head Circumference      Peak Flow      Pain Score 0     Pain Loc      Pain Edu?      Excl. in [place]?    No data found.  Updated Vital Signs BP (!) 156/68 (BP Location: Right Arm)   Pulse 86   Temp 98.2 F (36.8 C) (Oral)   Resp (!) 26   SpO2 98%    Visual Acuity Right Eye Distance:   Left Eye Distance:   Bilateral Distance:    Right Eye Near:   Left Eye Near:    Bilateral Near:     Physical Exam Vitals and nursing note reviewed.  Constitutional:      General: She is not  in acute distress.    Appearance: She is well-developed.  HENT:     Head: Normocephalic and atraumatic.     Mouth/Throat:     Mouth: Mucous membranes are moist.  Eyes:     Conjunctiva/sclera: Conjunctivae normal.  Cardiovascular:     Rate and Rhythm: Normal rate and regular rhythm.     Heart sounds: Normal heart sounds.  Pulmonary:     Effort: Pulmonary effort is normal. No respiratory distress.     Breath sounds: Normal breath sounds. No wheezing or rhonchi.     Comments: Lung sounds diminished. Abdominal:     Palpations: Abdomen is soft.     Tenderness: There is no abdominal tenderness. There is no guarding or rebound.  Musculoskeletal:     Cervical back: Neck supple.  Skin:    General: Skin is warm and dry.     Findings: No rash.  Neurological:     Mental Status: She is alert. Mental status is at baseline.  Psychiatric:        Mood and Affect: Mood normal.        Behavior: Behavior normal.      UC Treatments / Results  Labs (all labs ordered are listed, but only abnormal results are displayed) Labs Reviewed  POCT URINALYSIS DIPSTICK, ED / UC - Abnormal; Notable for the following components:      Result Value   Hgb urine dipstick TRACE (*)    Protein, ur 100 (*)    All other components within normal limits  RESP PANEL BY RT-PCR (FLU A&B, COVID) ARPGX2    EKG   Radiology DG Chest 2 View  Result Date: 09/20/2020 CLINICAL DATA:  Productive cough.  Shortness of breath. EXAM: CHEST - 2 VIEW COMPARISON:  None. FINDINGS: There is infiltrate in the right lung, particularly peripherally. No definite infiltrate on the left. Moderate hiatal hernia. The hila, mediastinum, and pleura are normal. IMPRESSION: Right-sided infiltrate worrisome  for pneumonia. Atypical infections should be considered. Recommend short-term follow-up imaging to ensure resolution. Electronically Signed   By: Dorise Bullion III M.D   On: 09/20/2020 17:28    Procedures Procedures (including critical care time)  Medications Ordered in UC Medications - No data to display  Initial Impression / Assessment and Plan / UC Course  I have reviewed the triage vital signs and the nursing notes.  Pertinent labs & imaging results that were available during my care of the patient were reviewed by me and considered in my medical decision making (see chart for details).   Right lung pneumonia.  Patient and her daughter declined transfer to the ED.  PCR COVID and flu pending.  Treating was Zithromax.  Strict instructions for going to the ED if she has acute shortness of breath or difficulty breathing.  Instructed her to call the PCP tomorrow to schedule an appointment for follow-up early this week.  Patient and her daughter agree to plan of care.   Final Clinical Impressions(s) / UC Diagnoses   Final diagnoses:  Pneumonia of right lung due to infectious organism, unspecified part of lung     Discharge Instructions     Take the antibiotic as directed.    Go to the emergency department if you have acute shortness of breath or difficulty breathing.    Schedule a follow-up appointment with your primary care provider early this week.        ED Prescriptions    Medication Sig Dispense Auth. Provider   azithromycin (ZITHROMAX) 250  MG tablet Take 1 tablet (250 mg total) by mouth daily. Take first 2 tablets together, then 1 every day until finished. 6 tablet Sharion Balloon, NP     PDMP not reviewed this encounter.   Sharion Balloon, NP 09/20/20 [phone]

## 2020-09-23 DIAGNOSIS — K219 Gastro-esophageal reflux disease without esophagitis: Secondary | ICD-10-CM | POA: Diagnosis not present

## 2020-09-23 DIAGNOSIS — R109 Unspecified abdominal pain: Secondary | ICD-10-CM | POA: Diagnosis not present

## 2020-09-23 DIAGNOSIS — J189 Pneumonia, unspecified organism: Secondary | ICD-10-CM | POA: Diagnosis not present

## 2020-09-30 ENCOUNTER — Other Ambulatory Visit: Payer: Self-pay | Admitting: Internal Medicine

## 2020-09-30 ENCOUNTER — Other Ambulatory Visit (HOSPITAL_COMMUNITY): Payer: Self-pay | Admitting: Internal Medicine

## 2020-09-30 ENCOUNTER — Ambulatory Visit
Admission: RE | Admit: 2020-09-30 | Discharge: 2020-09-30 | Disposition: A | Discharge status: Home / Self Care | Payer: PPO | Source: Ambulatory Visit | Attending: Internal Medicine | Admitting: Internal Medicine

## 2020-09-30 DIAGNOSIS — K449 Diaphragmatic hernia without obstruction or gangrene: Secondary | ICD-10-CM | POA: Diagnosis not present

## 2020-09-30 DIAGNOSIS — I519 Heart disease, unspecified: Secondary | ICD-10-CM | POA: Diagnosis not present

## 2020-09-30 DIAGNOSIS — E782 Mixed hyperlipidemia: Secondary | ICD-10-CM | POA: Diagnosis not present

## 2020-09-30 DIAGNOSIS — J189 Pneumonia, unspecified organism: Secondary | ICD-10-CM

## 2020-09-30 DIAGNOSIS — R109 Unspecified abdominal pain: Secondary | ICD-10-CM

## 2020-09-30 DIAGNOSIS — Z0000 Encounter for general adult medical examination without abnormal findings: Secondary | ICD-10-CM | POA: Diagnosis not present

## 2020-09-30 DIAGNOSIS — R918 Other nonspecific abnormal finding of lung field: Secondary | ICD-10-CM | POA: Diagnosis not present

## 2020-09-30 DIAGNOSIS — I700 Atherosclerosis of aorta: Secondary | ICD-10-CM | POA: Diagnosis not present

## 2020-09-30 DIAGNOSIS — Z1389 Encounter for screening for other disorder: Secondary | ICD-10-CM | POA: Diagnosis not present

## 2020-09-30 DIAGNOSIS — R7303 Prediabetes: Secondary | ICD-10-CM | POA: Diagnosis not present

## 2020-09-30 DIAGNOSIS — D682 Hereditary deficiency of other clotting factors: Secondary | ICD-10-CM | POA: Diagnosis not present

## 2020-09-30 DIAGNOSIS — N1831 Chronic kidney disease, stage 3a: Secondary | ICD-10-CM | POA: Diagnosis not present

## 2020-09-30 DIAGNOSIS — D696 Thrombocytopenia, unspecified: Secondary | ICD-10-CM | POA: Diagnosis not present

## 2020-09-30 DIAGNOSIS — I2699 Other pulmonary embolism without acute cor pulmonale: Secondary | ICD-10-CM | POA: Diagnosis not present

## 2020-09-30 DIAGNOSIS — I82402 Acute embolism and thrombosis of unspecified deep veins of left lower extremity: Secondary | ICD-10-CM | POA: Diagnosis not present

## 2020-09-30 DIAGNOSIS — K219 Gastro-esophageal reflux disease without esophagitis: Secondary | ICD-10-CM | POA: Diagnosis not present

## 2020-09-30 DIAGNOSIS — I10 Essential (primary) hypertension: Secondary | ICD-10-CM | POA: Diagnosis not present

## 2020-09-30 DIAGNOSIS — R7309 Other abnormal glucose: Secondary | ICD-10-CM | POA: Diagnosis not present

## 2020-09-30 DIAGNOSIS — M47814 Spondylosis without myelopathy or radiculopathy, thoracic region: Secondary | ICD-10-CM | POA: Diagnosis not present

## 2020-09-30 DIAGNOSIS — H9190 Unspecified hearing loss, unspecified ear: Secondary | ICD-10-CM | POA: Diagnosis not present

## 2020-10-02 ENCOUNTER — Other Ambulatory Visit: Payer: Self-pay | Admitting: Gastroenterology

## 2020-10-02 DIAGNOSIS — R634 Abnormal weight loss: Secondary | ICD-10-CM

## 2020-10-02 DIAGNOSIS — R1030 Lower abdominal pain, unspecified: Secondary | ICD-10-CM | POA: Diagnosis not present

## 2020-10-02 DIAGNOSIS — R1084 Generalized abdominal pain: Secondary | ICD-10-CM | POA: Diagnosis not present

## 2020-10-02 DIAGNOSIS — R630 Anorexia: Secondary | ICD-10-CM | POA: Diagnosis not present

## 2020-10-02 DIAGNOSIS — Z7901 Long term (current) use of anticoagulants: Secondary | ICD-10-CM | POA: Diagnosis not present

## 2020-10-05 ENCOUNTER — Encounter (HOSPITAL_COMMUNITY): Payer: Self-pay

## 2020-10-05 ENCOUNTER — Ambulatory Visit (HOSPITAL_COMMUNITY): Payer: PPO

## 2020-10-08 DIAGNOSIS — I10 Essential (primary) hypertension: Secondary | ICD-10-CM | POA: Diagnosis not present

## 2020-10-08 DIAGNOSIS — R109 Unspecified abdominal pain: Secondary | ICD-10-CM | POA: Diagnosis not present

## 2020-10-08 DIAGNOSIS — Z8701 Personal history of pneumonia (recurrent): Secondary | ICD-10-CM | POA: Diagnosis not present

## 2020-10-21 ENCOUNTER — Other Ambulatory Visit: Payer: Self-pay | Admitting: Internal Medicine

## 2020-10-21 ENCOUNTER — Ambulatory Visit
Admission: RE | Admit: 2020-10-21 | Discharge: 2020-10-21 | Disposition: A | Discharge status: Home / Self Care | Payer: Medicare Other | Source: Ambulatory Visit | Attending: Internal Medicine | Admitting: Internal Medicine

## 2020-10-21 DIAGNOSIS — J189 Pneumonia, unspecified organism: Secondary | ICD-10-CM

## 2020-10-21 DIAGNOSIS — R059 Cough, unspecified: Secondary | ICD-10-CM | POA: Diagnosis not present

## 2020-10-26 ENCOUNTER — Ambulatory Visit
Admission: RE | Admit: 2020-10-26 | Discharge: 2020-10-26 | Disposition: A | Discharge status: Home / Self Care | Payer: Medicare Other | Source: Ambulatory Visit | Attending: Gastroenterology | Admitting: Gastroenterology

## 2020-10-26 DIAGNOSIS — Z9049 Acquired absence of other specified parts of digestive tract: Secondary | ICD-10-CM | POA: Diagnosis not present

## 2020-10-26 DIAGNOSIS — I700 Atherosclerosis of aorta: Secondary | ICD-10-CM | POA: Diagnosis not present

## 2020-10-26 DIAGNOSIS — K449 Diaphragmatic hernia without obstruction or gangrene: Secondary | ICD-10-CM | POA: Diagnosis not present

## 2020-10-26 DIAGNOSIS — R634 Abnormal weight loss: Secondary | ICD-10-CM

## 2020-10-26 DIAGNOSIS — R1084 Generalized abdominal pain: Secondary | ICD-10-CM

## 2020-10-26 DIAGNOSIS — R1030 Lower abdominal pain, unspecified: Secondary | ICD-10-CM

## 2020-10-26 DIAGNOSIS — K8020 Calculus of gallbladder without cholecystitis without obstruction: Secondary | ICD-10-CM | POA: Diagnosis not present

## 2020-10-26 MED ORDER — IOPAMIDOL (ISOVUE-300) INJECTION 61%
100.0000 mL | Freq: Once | INTRAVENOUS | Status: AC | PRN
  Administered 2020-10-26: 100 mL via INTRAVENOUS

## 2021-01-28 DIAGNOSIS — I700 Atherosclerosis of aorta: Secondary | ICD-10-CM | POA: Diagnosis not present

## 2021-01-28 DIAGNOSIS — I10 Essential (primary) hypertension: Secondary | ICD-10-CM | POA: Diagnosis not present

## 2021-01-28 DIAGNOSIS — E782 Mixed hyperlipidemia: Secondary | ICD-10-CM | POA: Diagnosis not present

## 2021-01-28 DIAGNOSIS — I2699 Other pulmonary embolism without acute cor pulmonale: Secondary | ICD-10-CM | POA: Diagnosis not present

## 2021-03-12 DIAGNOSIS — U071 COVID-19: Secondary | ICD-10-CM | POA: Diagnosis not present

## 2021-05-28 DIAGNOSIS — R350 Frequency of micturition: Secondary | ICD-10-CM | POA: Diagnosis not present

## 2021-05-28 DIAGNOSIS — D696 Thrombocytopenia, unspecified: Secondary | ICD-10-CM | POA: Diagnosis not present

## 2021-05-28 DIAGNOSIS — D682 Hereditary deficiency of other clotting factors: Secondary | ICD-10-CM | POA: Diagnosis not present

## 2021-05-28 DIAGNOSIS — E782 Mixed hyperlipidemia: Secondary | ICD-10-CM | POA: Diagnosis not present

## 2021-05-28 DIAGNOSIS — I10 Essential (primary) hypertension: Secondary | ICD-10-CM | POA: Diagnosis not present

## 2021-10-19 DIAGNOSIS — M810 Age-related osteoporosis without current pathological fracture: Secondary | ICD-10-CM | POA: Diagnosis not present

## 2021-10-19 DIAGNOSIS — I2699 Other pulmonary embolism without acute cor pulmonale: Secondary | ICD-10-CM | POA: Diagnosis not present

## 2021-10-19 DIAGNOSIS — Z1389 Encounter for screening for other disorder: Secondary | ICD-10-CM | POA: Diagnosis not present

## 2021-10-19 DIAGNOSIS — E041 Nontoxic single thyroid nodule: Secondary | ICD-10-CM | POA: Diagnosis not present

## 2021-10-19 DIAGNOSIS — E782 Mixed hyperlipidemia: Secondary | ICD-10-CM | POA: Diagnosis not present

## 2021-10-19 DIAGNOSIS — I10 Essential (primary) hypertension: Secondary | ICD-10-CM | POA: Diagnosis not present

## 2021-10-19 DIAGNOSIS — K219 Gastro-esophageal reflux disease without esophagitis: Secondary | ICD-10-CM | POA: Diagnosis not present

## 2021-10-19 DIAGNOSIS — Z0000 Encounter for general adult medical examination without abnormal findings: Secondary | ICD-10-CM | POA: Diagnosis not present

## 2021-10-20 ENCOUNTER — Other Ambulatory Visit: Payer: Self-pay | Admitting: Internal Medicine

## 2021-10-20 DIAGNOSIS — R109 Unspecified abdominal pain: Secondary | ICD-10-CM

## 2021-11-02 ENCOUNTER — Ambulatory Visit
Admission: RE | Admit: 2021-11-02 | Discharge: 2021-11-02 | Disposition: A | Discharge status: Home / Self Care | Payer: Medicare Other | Source: Ambulatory Visit | Attending: Internal Medicine | Admitting: Internal Medicine

## 2021-11-02 DIAGNOSIS — R109 Unspecified abdominal pain: Secondary | ICD-10-CM

## 2021-11-02 DIAGNOSIS — I714 Abdominal aortic aneurysm, without rupture, unspecified: Secondary | ICD-10-CM | POA: Diagnosis not present

## 2021-11-02 DIAGNOSIS — K8020 Calculus of gallbladder without cholecystitis without obstruction: Secondary | ICD-10-CM | POA: Diagnosis not present

## 2021-11-02 DIAGNOSIS — K7689 Other specified diseases of liver: Secondary | ICD-10-CM | POA: Diagnosis not present

## 2021-11-04 ENCOUNTER — Encounter (HOSPITAL_COMMUNITY): Payer: Self-pay | Admitting: Emergency Medicine

## 2021-11-04 ENCOUNTER — Emergency Department (HOSPITAL_COMMUNITY): Payer: Medicare Other

## 2021-11-04 ENCOUNTER — Emergency Department (HOSPITAL_COMMUNITY)
Admission: EM | Admit: 2021-11-04 | Discharge: 2021-11-04 | Disposition: A | Discharge status: Home / Self Care | Payer: Medicare Other | Attending: Emergency Medicine | Admitting: Emergency Medicine

## 2021-11-04 DIAGNOSIS — K8020 Calculus of gallbladder without cholecystitis without obstruction: Secondary | ICD-10-CM

## 2021-11-04 DIAGNOSIS — Z7901 Long term (current) use of anticoagulants: Secondary | ICD-10-CM | POA: Insufficient documentation

## 2021-11-04 DIAGNOSIS — R112 Nausea with vomiting, unspecified: Secondary | ICD-10-CM | POA: Insufficient documentation

## 2021-11-04 DIAGNOSIS — Z79899 Other long term (current) drug therapy: Secondary | ICD-10-CM | POA: Insufficient documentation

## 2021-11-04 DIAGNOSIS — Z20822 Contact with and (suspected) exposure to covid-19: Secondary | ICD-10-CM | POA: Diagnosis not present

## 2021-11-04 DIAGNOSIS — R109 Unspecified abdominal pain: Secondary | ICD-10-CM

## 2021-11-04 DIAGNOSIS — R1011 Right upper quadrant pain: Secondary | ICD-10-CM | POA: Diagnosis not present

## 2021-11-04 DIAGNOSIS — K769 Liver disease, unspecified: Secondary | ICD-10-CM | POA: Diagnosis not present

## 2021-11-04 DIAGNOSIS — K760 Fatty (change of) liver, not elsewhere classified: Secondary | ICD-10-CM | POA: Diagnosis not present

## 2021-11-04 LAB — CBC WITH DIFFERENTIAL/PLATELET
Abs Immature Granulocytes: 0.04 10*3/uL (ref 0.00–0.07)
Basophils Absolute: 0 10*3/uL (ref 0.0–0.1)
Basophils Relative: 0 %
Eosinophils Absolute: 0.1 10*3/uL (ref 0.0–0.5)
Eosinophils Relative: 1 %
HCT: 32.7 % — ABNORMAL LOW (ref 36.0–46.0)
Hemoglobin: 9.8 g/dL — ABNORMAL LOW (ref 12.0–15.0)
Immature Granulocytes: 0 %
Lymphocytes Relative: 20 %
Lymphs Abs: 2.2 10*3/uL (ref 0.7–4.0)
MCH: 25.2 pg — ABNORMAL LOW (ref 26.0–34.0)
MCHC: 30 g/dL (ref 30.0–36.0)
MCV: 84.1 fL (ref 80.0–100.0)
Monocytes Absolute: 0.9 10*3/uL (ref 0.1–1.0)
Monocytes Relative: 8 %
Neutro Abs: 7.9 10*3/uL — ABNORMAL HIGH (ref 1.7–7.7)
Neutrophils Relative %: 71 %
Platelets: 241 10*3/uL (ref 150–400)
RBC: 3.89 MIL/uL (ref 3.87–5.11)
RDW: 16.4 % — ABNORMAL HIGH (ref 11.5–15.5)
WBC: 11.1 10*3/uL — ABNORMAL HIGH (ref 4.0–10.5)
nRBC: 0 % (ref 0.0–0.2)

## 2021-11-04 LAB — RESP PANEL BY RT-PCR (FLU A&B, COVID) ARPGX2
Influenza A by PCR: NEGATIVE
Influenza B by PCR: NEGATIVE
SARS Coronavirus 2 by RT PCR: NEGATIVE

## 2021-11-04 LAB — COMPREHENSIVE METABOLIC PANEL
ALT: 11 U/L (ref 0–44)
AST: 20 U/L (ref 15–41)
Albumin: 3.3 g/dL — ABNORMAL LOW (ref 3.5–5.0)
Alkaline Phosphatase: 68 U/L (ref 38–126)
Anion gap: 12 (ref 5–15)
BUN: 21 mg/dL (ref 8–23)
CO2: 24 mmol/L (ref 22–32)
Calcium: 9.5 mg/dL (ref 8.9–10.3)
Chloride: 99 mmol/L (ref 98–111)
Creatinine, Ser: 1.24 mg/dL — ABNORMAL HIGH (ref 0.44–1.00)
GFR, Estimated: 42 mL/min — ABNORMAL LOW (ref >60)
Glucose, Bld: 105 mg/dL — ABNORMAL HIGH (ref 70–99)
Potassium: 4.3 mmol/L (ref 3.5–5.1)
Sodium: 135 mmol/L (ref 135–145)
Total Bilirubin: 0.5 mg/dL (ref 0.3–1.2)
Total Protein: 7 g/dL (ref 6.5–8.1)

## 2021-11-04 LAB — URINALYSIS, ROUTINE W REFLEX MICROSCOPIC
Bilirubin Urine: NEGATIVE
Glucose, UA: NEGATIVE mg/dL
Hgb urine dipstick: NEGATIVE
Ketones, ur: NEGATIVE mg/dL
Leukocytes,Ua: NEGATIVE
Nitrite: NEGATIVE
Protein, ur: NEGATIVE mg/dL
Specific Gravity, Urine: 1.01 (ref 1.005–1.030)
pH: 5.5 (ref 5.0–8.0)

## 2021-11-04 LAB — LIPASE, BLOOD: Lipase: 32 U/L (ref 11–51)

## 2021-11-04 MED ORDER — ONDANSETRON HCL 4 MG PO TABS
4.0000 mg | ORAL_TABLET | Freq: Three times a day (TID) | ORAL | 0 refills | Status: DC | PRN
Start: 2021-11-04 — End: 2022-04-07

## 2021-11-04 NOTE — Discharge Instructions (Addendum)
Please call [facility] at 9:00 tomorrow morning to verify your consult.  Take 1 ondansetron tablet every 8 hours as needed for nausea/vomiting  Turn to the ED for new or worsening symptoms including but not limited to uncontrollable nausea/vomiting, dizziness, lightheadedness, intense abdominal pain, chest pain, fever, shortness of breath.

## 2021-11-04 NOTE — ED Provider Notes (Addendum)
[facility] EMERGENCY DEPARTMENT Provider Note   CSN: [id number] Arrival date & time: 11/04/21  1039     History  Chief Complaint  Patient presents with   Abdominal Pain    Sara Rodriguez is a 88 y.o. female presenting today with continued right upper quadrant abdominal pain and recent nausea and vomiting over the last 2 days.  She is accompanied by her daughter.  She has known cholelithiasis and is supposed to consult with the surgical group tomorrow for possible cholecystectomy on 11/16/2021.  She was sent by her internal medicine PCP Dr. Lysle Rubens due to development of nausea and vomiting over the last 2 days.  She denies any changes in bowel habits and states that she has longstanding dark-colored stools.  She takes iron supplements and has been taking Pepto-Bismol the last few days.  She has a history of DVT in her left leg that progressed to a PE.  Patient denies shortness of breath, loss of consciousness, chest pain, diarrhea, constipation, changes in bowel movement color/consistency, fever, chills, recent illness or infection.    Patient is on Eliquis and has taken it this morning.  She is no longer on warfarin.  The history is provided by the patient, medical records and a relative (Daughter).  Abdominal Pain Associated symptoms: nausea and vomiting   Associated symptoms: no chest pain, no chills, no constipation, no cough, no diarrhea, no dysuria, no fever, no hematuria, no shortness of breath and no sore throat       Home Medications Prior to Admission medications   Medication Sig Start Date End Date Taking? Authorizing Provider  acetaminophen (TYLENOL) 500 MG tablet Take 1,000 mg by mouth every 8 (eight) hours as needed for mild pain or headache.    [provider]  alendronate (FOSAMAX) 70 MG tablet Take 1 tablet by mouth once a week. Sunday 03/22/16   [provider]  amLODipine (NORVASC) 5 MG tablet Take 1 tablet (5 mg total) by mouth  daily. Patient taking differently: Take 5 mg by mouth 2 (two) times daily.  07/14/16 08/01/17  Lorretta Harp, MD  Apixaban (ELIQUIS PO) Take by mouth.    [provider]  atorvastatin (LIPITOR) 10 MG tablet Take 1 tablet by mouth daily. 01/22/16   [provider]  azithromycin (ZITHROMAX) 250 MG tablet Take 1 tablet (250 mg total) by mouth daily. Take first 2 tablets together, then 1 every day until finished. 09/20/20   Sharion Balloon, NP  Cholecalciferol (VITAMIN D) 2000 units CAPS Take 2,000 Units by mouth daily.    [provider]  diclofenac sodium (VOLTAREN) 1 % GEL APP 2 TO 4 GRAMS 4 TIMES DAILY AS NEEDED FOR PAIN 07/21/17   [provider]  ELIQUIS 5 MG TABS tablet  06/04/20   [provider]  hydrochlorothiazide (HYDRODIURIL) 25 MG tablet  06/06/20   [provider]  HYDROcodone-acetaminophen (NORCO/VICODIN) 5-325 MG tablet Take 2 tablets by mouth every 4 (four) hours as needed. 01/27/18   Wardell Honour, MD  losartan (COZAAR) 100 MG tablet  06/04/20   [provider]  losartan-hydrochlorothiazide (HYZAAR) 100-12.5 MG tablet Take 1 tablet by mouth daily. 01/22/16   [provider]  METOPROLOL SUCCINATE PO Take by mouth.    [provider]  metoprolol tartrate (LOPRESSOR) 25 MG tablet  06/04/20   [provider]  Omega-3 Fatty Acids (FISH OIL) 1000 MG CAPS Take 1,000 mg by mouth daily.    [provider]  omeprazole (PRILOSEC) 20 MG capsule Take 20 mg by mouth daily.  01/22/16   [provider]  tetrahydrozoline-zinc (VISINE-AC) 0.05-0.25 % ophthalmic solution Place 2 drops into both eyes daily as needed (dry eyes).    [provider]  warfarin (COUMADIN) 5 MG tablet Take 5-10 mg by mouth daily at 6 PM. Takes 5 mg daily except on Wednesday and fridays and takes 2 tablets (10 mg) 01/23/16   [provider]      Allergies    Patient has no known allergies.    Review of  Systems   Review of Systems  Constitutional:  Positive for appetite change. Negative for chills, diaphoresis and fever.  HENT:  Negative for congestion, ear pain, rhinorrhea and sore throat.   Eyes:  Negative for pain and visual disturbance.  Respiratory:  Negative for cough and shortness of breath.   Cardiovascular:  Negative for chest pain and palpitations.  Gastrointestinal:  Positive for abdominal pain, nausea and vomiting. Negative for constipation and diarrhea.  Genitourinary:  Negative for dysuria and hematuria.  Musculoskeletal:  Negative for arthralgias, back pain, myalgias, neck pain and neck stiffness.  Skin:  Negative for color change and rash.  Neurological:  Negative for dizziness, seizures, syncope and light-headedness.  All other systems reviewed and are negative.  Physical Exam Updated Vital Signs BP (!) 128/52 (BP Location: Right Arm)    Pulse 77    Temp 98.3 F (36.8 C) (Oral)    Resp 17    SpO2 100%  Physical Exam Vitals and nursing note reviewed.  Constitutional:      General: She is not in acute distress.    Appearance: She is well-developed. She is not ill-appearing or diaphoretic.  HENT:     Head: Normocephalic and atraumatic.  Eyes:     Conjunctiva/sclera: Conjunctivae normal.  Cardiovascular:     Rate and Rhythm: Normal rate and regular rhythm.     Pulses:          Carotid pulses are 2+ on the right side and 2+ on the left side.      Posterior tibial pulses are 2+ on the right side and 2+ on the left side.     Heart sounds: Normal heart sounds. No murmur heard.    Comments:  LLE edema grade 1+ is baseline for patient post DVT Pulmonary:     Effort: Pulmonary effort is normal. No respiratory distress.     Breath sounds: Normal breath sounds.  Abdominal:     General: Abdomen is protuberant. Bowel sounds are normal. There are no signs of injury.     Palpations: Abdomen is soft. There is no mass.     Tenderness: There is abdominal tenderness in the right  upper quadrant and suprapubic area. There is no guarding or rebound. Positive signs include Murphy's sign.  Musculoskeletal:        General: No swelling.     Cervical back: Neck supple.     Right lower leg: No edema.     Left lower leg: 1+ Edema present.  Skin:    General: Skin is warm and dry.     Capillary Refill: Capillary refill takes 2 to 3 seconds.     Coloration: Skin is not cyanotic.  Neurological:     Mental Status: She is alert and oriented to person, place, and time.  Psychiatric:        Mood and Affect: Mood normal.    ED Results / Procedures /  Treatments   Labs (all labs ordered are listed, but only abnormal results are displayed) Labs Reviewed  CBC WITH DIFFERENTIAL/PLATELET - Abnormal; Notable for the following components:      Result Value   WBC 11.1 (*)    Hemoglobin 9.8 (*)    HCT 32.7 (*)    MCH 25.2 (*)    RDW 16.4 (*)    Neutro Abs 7.9 (*)    All other components within normal limits  COMPREHENSIVE METABOLIC PANEL - Abnormal; Notable for the following components:   Glucose, Bld 105 (*)    Creatinine, Ser 1.24 (*)    Albumin 3.3 (*)    GFR, Estimated 42 (*)    All other components within normal limits  RESP PANEL BY RT-PCR (FLU A&B, COVID) ARPGX2  LIPASE, BLOOD  URINALYSIS, ROUTINE W REFLEX MICROSCOPIC    EKG None  Radiology No results found.  Procedures Procedures    Medications Ordered in ED Medications - No data to display  ED Course/ Medical Decision Making/ A&P                           Medical Decision Making Amount and/or Complexity of Data Reviewed Radiology: ordered.  Risk Prescription drug management.   88 year old female presents to the ED for concern of abdominal pain, nausea, vomiting, which involves an extensive number of treatment options and is a complaint that carries with it a high risk of complications and morbidity.  The differential diagnosis includes cholelithiasis, choledocholithiasis, acute cholecystitis,  pancreatitis, appendicitis, bowel obstruction.  Comorbidities that complicate the patient evaluation include history of DVT, and, chronic kidney disease stage III, long-term use of anticoagulants.  No history obtained from urgent care and primary care office visits, and other medical records visible via epic.  I ordered and personally interpreted all labs.  The pertinent results include an elevated creatinine of 1.24 and GFR of 42 consistent with stage III kidney disease.  Lipase was 32, which does not suggest acute biliary distress.  AST and ALT levels are not elevated, nor is her alk phos or total bili.  Patient also showing consistent low hemoglobin 9.8 and hematocrit 32.7 which is consistent with her established iron deficiency anemia.  COVID and influenza test were negative.  Urinalysis was unremarkable.    I ordered imaging studies including an ultrasound of the abdomen.  I independently visualized and interpreted imaging which showed cholelithiasis evidence for acute cholecystitis.  Also showed a mildly echogenic kidney that may be related to hepatocellular disease.  Unable to see the ultrasound images from the patient's external abdominal ultrasound this past Tuesday, but was able to see it's report.  Today's ultrasound does not identify an isoechoic liver lesion, in contrast to her ultrasound from a few days prior.  Reevaluated the patient who was stable heart rate was in the 60s.  He does not appear nauseous, is eating crackers, and laughing.  Ultrasound does not show any significant changes with her gallbladder, or development of cholecystitis, or movement of stones outside the gallbladder.  Physical exam was consistent with cholelithiasis.  Do not suspect infection or sepsis based on labs and presentation.  Patient was treated 3 to 4 months ago for a UTI, and physical exam shows suprapubic tenderness.  Urinalysis is negative, but recommended patient follow-up with primary care for possible  reevaluation.  She has no urinary symptoms at this time to support a UTI.  Informed by Tera Helper PA-C who consulted  with a representative from [facility] but the patient is to call the office tomorrow at 9:00 AM to confirm their pre-existing appointment for surgical consultation tomorrow.  I shared this with the patient and her daughter, to which they were agreeable.    Considered surgical intervention, but I do not feel she meets criteria for immediate surgery based on her presentation, physical exam, and that she is still on Eliquis.  She does not appear to be in any acute distress.  Considered antibiotics but physical presentation, vitals, imaging, and labs do not suggest infection.  I feel comfortable discharging the patient at this time.  Discussed treatment course with patient and her daughter thoroughly, to which demonstrated closed-loop communication by repeating it back to me.  They had no further questions.        Final Clinical Impression(s) / ED Diagnoses Final diagnoses:  Abdominal pain    Rx / DC Orders ED Discharge Orders     None         Prince Rome, PA-C 14/70/92 [id number]    Prince Rome, PA-C 73/40/37 Vernon Prey, MD 11/05/21 1601

## 2021-11-04 NOTE — ED Triage Notes (Signed)
Daughter stated, she has come from her PCP and stated she has gallstones and needs to come here.

## 2021-11-04 NOTE — ED Provider Triage Note (Signed)
Emergency Medicine Provider Triage Evaluation Note  Sara Rodriguez , a 88 y.o. female  was evaluated in triage.  Pt complains of abdominal pain ongoing for several years.  Went to PCP and had an ultrasound on November 02, 2021 showing gallstones as well as spots on the liver.  PCP was unable to get patient a timely appointment with surgery and sent to the ER for surgical evaluation.  Reports she has nausea and vomiting at times.  Denies fevers and chills.  Patient does take Eliquis, last took her Eliquis this morning.  Review of Systems  Positive: Abdominal pain, vomiting Negative: Fevers, chills  Physical Exam  BP (!) 120/56 (BP Location: Right Arm)    Pulse 75    Temp 98.4 F (36.9 C) (Oral)    Resp 18    SpO2 99%  Gen:   Awake, no distress   Resp:  Normal effort  MSK:   Moves extremities without difficulty  Other:  Non toxic, no distress   Medical Decision Making  Medically screening exam initiated at 11:05 AM.  Appropriate orders placed.  Penni Penado was informed that the remainder of the evaluation will be completed by another provider, this initial triage assessment does not replace that evaluation, and the importance of remaining in the ED until their evaluation is complete.     Tacy Learn, PA-C 11/04/21 1110

## 2021-11-05 ENCOUNTER — Ambulatory Visit: Payer: Self-pay | Admitting: Surgery

## 2021-11-05 DIAGNOSIS — K810 Acute cholecystitis: Secondary | ICD-10-CM | POA: Diagnosis not present

## 2021-11-05 DIAGNOSIS — K811 Chronic cholecystitis: Secondary | ICD-10-CM | POA: Diagnosis not present

## 2021-11-05 NOTE — H&P (View-Only) (Signed)
Sara Rodriguez [id number]    Referring Provider:  Wenda Low, MD     Subjective    Chief Complaint: Cholelithiasis       History of Present Illness: 88 year old woman with history of DVT/PE on Eliquis (her daughter states she is also had an IVC filter placed), factor V Leiden, CKD 3, iron deficiency anemia (per daughter has had a negative colonoscopy and EGD), osteoarthritis, hypertension, hyperlipidemia, GERD who is following up after an emergency department visit yesterday for right upper quadrant pain associated with nausea and vomiting.  This had been going on for the last 2 or 3 days. Ultrasound, CMP and CBC done yesterday with white count 11.1, hemoglobin of 9.8, creatinine of 1.24, but LFTs normal.  Gallstones up to 9 mm without sonographic evidence of cholecystitis, common bile duct 5 mm.  She was discharged from the emergency department to follow-up with [place] today. She has been having intermittent symptoms including epigastric and right upper quadrant pain with associated nausea for the last 6 months or so, but has been worse over the last 2 weeks.  She has not had much of an appetite, reports she is losing weight, but is maintaining adequate hydration and drinking plenty of water and juice etc.   Previous abdominal surgery includes appendectomy, abdominal hysterectomy.       Review of Systems: A complete review of systems was obtained from the patient.  I have reviewed this information and discussed as appropriate with the patient.  See HPI as well for other ROS.     Medical History: Past Medical History      Past Medical History:  Diagnosis Date   Anemia     DVT (deep venous thrombosis) (CMS-HCC)     GERD (gastroesophageal reflux disease)     Hypertension     Pulmonary hypertension (CMS-HCC)          There is no problem list on file for this patient.     Past Surgical History       Past Surgical History:  Procedure Laterality Date   APPENDECTOMY        HYSTERECTOMY       Knee Surgery            Allergies  No Known Allergies           Current Outpatient Medications on File Prior to Visit  Medication Sig Dispense Refill   albuterol 90 mcg/actuation inhaler 2  puff as needed       hydroCHLOROthiazide (HYDRODIURIL) 25 MG tablet Take 25 mg by mouth once daily       olmesartan (BENICAR) 40 MG tablet 1 tablet       omeprazole (PRILOSEC) 40 MG DR capsule 1 capsule 30 minutes before morning meal       ondansetron (ZOFRAN) 4 MG tablet 1 tablet       apixaban (ELIQUIS) 5 mg tablet Take 5 mg by mouth 2 (two) times daily       atorvastatin (LIPITOR) 10 MG tablet Take 1 tablet       cholecalciferol (VITAMIN D3) 2,000 unit capsule Take by mouth       docosahexaenoic acid-epa 120-180 mg Cap Take by mouth       ferrous sulfate 325 (65 FE) MG tablet 1 tablet       tetrahydrozoline-zinc (VISINE-AC) 0.05-0.25 % ophthalmic solution Apply to eye        No current facility-administered medications on file prior to visit.      Family  History       Family History  Problem Relation Age of Onset   High blood pressure (Hypertension) Sister     Diabetes Sister     Deep vein thrombosis (DVT or abnormal blood clot formation) Sister     Deep vein thrombosis (DVT or abnormal blood clot formation) Brother     Diabetes Brother          Social History       Tobacco Use  Smoking Status Never  Smokeless Tobacco Never      Social History  Social History        Socioeconomic History   Marital status: Married  Tobacco Use   Smoking status: Never   Smokeless tobacco: Never  Substance and Sexual Activity   Alcohol use: Never   Drug use: Never        Objective:         Vitals:    11/05/21 1520  BP: 132/60  Pulse: 91  Temp: 36.7 C (98 F)  SpO2: 99%  Weight: 75.2 kg (165 lb 12.8 oz)  Height: 162.6 cm (5\' 4" )    Body mass index is 28.46 kg/m.   Alert, calm, cooperative woman who appears stated age Unlabored respirations Abdomen  is soft, nondistended, tender in the epigastrium and right upper quadrant without palpable mass       Assessment and Plan:  Diagnoses and all orders for this visit:   Acute cholecystitis   Chronic cholecystitis     She is still having fairly significant symptoms, but has had no fever, no jaundice, and is able to drink plenty of fluids.  I offered the patient direct admission with anticipated cholecystectomy in a couple days once her Eliquis is worn off but she does not feel this is necessary at this time.  She was given strict and straightforward instructions about when she might need to go back to the ER or at least call our office to see if she could be direct admitted if she develops worsening symptoms prior to her surgery can be scheduled.   I recommend proceeding with laparoscopic or robotic cholecystectomy with possible cholangiogram.  Discussed the relevant anatomy using a diagram to demonstrate, and went over surgical technique.  Discussed risks of surgery including bleeding, pain, scarring, intraabdominal injury specifically to the common bile duct and sequelae, bile leak, conversion to open surgery or subtotal cholecystectomy, failure to resolve symptoms, blood clots/ pulmonary embolus, heart attack, pneumonia, stroke, death.  She understands that her risk of complications and slow recovery is higher based on her age and other medical problems.  Her daughter who is her power of attorney was with her here today.  Both of their questions were welcomed and answered to their satisfaction.      Sara Mapes [name], MD

## 2021-11-05 NOTE — H&P (Signed)
Sara Rodriguez [id number]    Referring Provider:  Wenda Low, MD     Subjective    Chief Complaint: Cholelithiasis       History of Present Illness: 88 year old woman with history of DVT/PE on Eliquis (her daughter states she is also had an IVC filter placed), factor V Leiden, CKD 3, iron deficiency anemia (per daughter has had a negative colonoscopy and EGD), osteoarthritis, hypertension, hyperlipidemia, GERD who is following up after an emergency department visit yesterday for right upper quadrant pain associated with nausea and vomiting.  This had been going on for the last 2 or 3 days. Ultrasound, CMP and CBC done yesterday with white count 11.1, hemoglobin of 9.8, creatinine of 1.24, but LFTs normal.  Gallstones up to 9 mm without sonographic evidence of cholecystitis, common bile duct 5 mm.  She was discharged from the emergency department to follow-up with [place] today. She has been having intermittent symptoms including epigastric and right upper quadrant pain with associated nausea for the last 6 months or so, but has been worse over the last 2 weeks.  She has not had much of an appetite, reports she is losing weight, but is maintaining adequate hydration and drinking plenty of water and juice etc.   Previous abdominal surgery includes appendectomy, abdominal hysterectomy.       Review of Systems: A complete review of systems was obtained from the patient.  I have reviewed this information and discussed as appropriate with the patient.  See HPI as well for other ROS.     Medical History: Past Medical History      Past Medical History:  Diagnosis Date   Anemia     DVT (deep venous thrombosis) (CMS-HCC)     GERD (gastroesophageal reflux disease)     Hypertension     Pulmonary hypertension (CMS-HCC)          There is no problem list on file for this patient.     Past Surgical History       Past Surgical History:  Procedure Laterality Date   APPENDECTOMY        HYSTERECTOMY       Knee Surgery            Allergies  No Known Allergies           Current Outpatient Medications on File Prior to Visit  Medication Sig Dispense Refill   albuterol 90 mcg/actuation inhaler 2  puff as needed       hydroCHLOROthiazide (HYDRODIURIL) 25 MG tablet Take 25 mg by mouth once daily       olmesartan (BENICAR) 40 MG tablet 1 tablet       omeprazole (PRILOSEC) 40 MG DR capsule 1 capsule 30 minutes before morning meal       ondansetron (ZOFRAN) 4 MG tablet 1 tablet       apixaban (ELIQUIS) 5 mg tablet Take 5 mg by mouth 2 (two) times daily       atorvastatin (LIPITOR) 10 MG tablet Take 1 tablet       cholecalciferol (VITAMIN D3) 2,000 unit capsule Take by mouth       docosahexaenoic acid-epa 120-180 mg Cap Take by mouth       ferrous sulfate 325 (65 FE) MG tablet 1 tablet       tetrahydrozoline-zinc (VISINE-AC) 0.05-0.25 % ophthalmic solution Apply to eye        No current facility-administered medications on file prior to visit.      Family  History       Family History  Problem Relation Age of Onset   High blood pressure (Hypertension) Sister     Diabetes Sister     Deep vein thrombosis (DVT or abnormal blood clot formation) Sister     Deep vein thrombosis (DVT or abnormal blood clot formation) Brother     Diabetes Brother          Social History       Tobacco Use  Smoking Status Never  Smokeless Tobacco Never      Social History  Social History        Socioeconomic History   Marital status: Married  Tobacco Use   Smoking status: Never   Smokeless tobacco: Never  Substance and Sexual Activity   Alcohol use: Never   Drug use: Never        Objective:         Vitals:    11/05/21 1520  BP: 132/60  Pulse: 91  Temp: 36.7 C (98 F)  SpO2: 99%  Weight: 75.2 kg (165 lb 12.8 oz)  Height: 162.6 cm (5\' 4" )    Body mass index is 28.46 kg/m.   Alert, calm, cooperative woman who appears stated age Unlabored respirations Abdomen  is soft, nondistended, tender in the epigastrium and right upper quadrant without palpable mass       Assessment and Plan:  Diagnoses and all orders for this visit:   Acute cholecystitis   Chronic cholecystitis     She is still having fairly significant symptoms, but has had no fever, no jaundice, and is able to drink plenty of fluids.  I offered the patient direct admission with anticipated cholecystectomy in a couple days once her Eliquis is worn off but she does not feel this is necessary at this time.  She was given strict and straightforward instructions about when she might need to go back to the ER or at least call our office to see if she could be direct admitted if she develops worsening symptoms prior to her surgery can be scheduled.   I recommend proceeding with laparoscopic or robotic cholecystectomy with possible cholangiogram.  Discussed the relevant anatomy using a diagram to demonstrate, and went over surgical technique.  Discussed risks of surgery including bleeding, pain, scarring, intraabdominal injury specifically to the common bile duct and sequelae, bile leak, conversion to open surgery or subtotal cholecystectomy, failure to resolve symptoms, blood clots/ pulmonary embolus, heart attack, pneumonia, stroke, death.  She understands that her risk of complications and slow recovery is higher based on her age and other medical problems.  Her daughter who is her power of attorney was with her here today.  Both of their questions were welcomed and answered to their satisfaction.      Quitman Norberto [name], MD

## 2021-11-10 ENCOUNTER — Encounter (HOSPITAL_COMMUNITY): Payer: Self-pay

## 2021-11-10 NOTE — Patient Instructions (Addendum)
DUE TO COVID-19 ONLY ONE VISITOR IS ALLOWED TO COME WITH YOU AND STAY IN THE WAITING ROOM ONLY DURING PRE OP AND PROCEDURE DAY OF SURGERY IF YOU ARE GOING HOME AFTER SURGERY. IF YOU ARE SPENDING THE NIGHT 2 PEOPLE MAY VISIT WITH YOU IN YOUR PRIVATE ROOM AFTER SURGERY UNTIL VISITING  HOURS ARE OVER AT 800 PM AND 1  VISITOR  MAY  SPEND THE NIGHT.                Sara Rodriguez    Your procedure is scheduled on: 11/16/21   Report to [facility] Main  Entrance   Report to admitting at  1:15 PM     Call this number if you have problems the morning of surgery [phone]    Remember: Do not eat food  :After Midnight the night before your surgery,   You may have clear liquids from midnight until ---.9:30 am    CLEAR LIQUID DIET   Foods Allowed                                                                     Foods Excluded  Coffee and tea, regular and decaf                             liquids that you cannot  Plain Jell-O any favor except red or purple                                           see through such as: Fruit ices (not with fruit pulp)                                     milk, soups, orange juice  Iced Popsicles                                    All solid food Carbonated beverages, regular and diet                                    Cranberry, grape and apple juices Sports drinks like Gatorade Lightly seasoned clear broth or consume(fat free) Sugar  _____________________________________________________________________   BRUSH YOUR TEETH MORNING OF SURGERY AND RINSE YOUR MOUTH OUT, NO CHEWING GUM CANDY OR MINTS.     Take these medicines the morning of surgery with A SIP OF WATER: Amlodipine, Omeprazole Use your inhaler and bring it with you           Stop taking _Eliquis__________on ___1/28_______as instructed by Dr. Conner_____________.  Stop taking ____________as directed by your Surgeon/Cardiologist.  Contact your Surgeon/Cardiologist for instructions  on Anticoagulant Therapy prior to surgery.                        You may not have any metal on your body including hair pins  and              piercings  Do not wear jewelry, make-up, lotions, powders or perfumes, deodorant             Do not wear nail polish on your fingernails.  Do not shave  48 hours prior to surgery.                Do not bring valuables to the hospital. [place].  Contacts, dentures or bridgework may not be worn into surgery.     Patients discharged the day of surgery will not be allowed to drive home.  IF YOU ARE HAVING SURGERY AND GOING HOME THE SAME DAY, YOU MUST HAVE AN ADULT TO DRIVE YOU HOME AND BE WITH YOU FOR 24 HOURS. YOU MAY GO HOME BY TAXI OR UBER OR ORTHERWISE, BUT AN ADULT MUST ACCOMPANY YOU HOME AND STAY WITH YOU FOR 24 HOURS.  Name and phone number of your driver:  Special Instructions: N/A              Please read over the following fact sheets you were given: _____________________________________________________________________             [facility] - Preparing for Surgery Before surgery, you can play an important role.  Because skin is not sterile, your skin needs to be as free of germs as possible.  You can reduce the number of germs on your skin by washing with CHG (chlorahexidine gluconate) soap before surgery.  CHG is an antiseptic cleaner which kills germs and bonds with the skin to continue killing germs even after washing. Please DO NOT use if you have an allergy to CHG or antibacterial soaps.  If your skin becomes reddened/irritated stop using the CHG and inform your nurse when you arrive at Short Stay. Do not shave (including legs and underarms) for at least 48 hours prior to the first CHG shower.   Please follow these instructions carefully:  1.  Shower with CHG Soap the night before surgery and the  morning of Surgery.  2.  If you choose to wash your hair, wash your hair first as usual  with your  normal  shampoo.  3.  After you shampoo, rinse your hair and body thoroughly to remove the  shampoo.                            4.  Use CHG as you would any other liquid soap.  You can apply chg directly  to the skin and wash                       Gently with a scrungie or clean washcloth.  5.  Apply the CHG Soap to your body ONLY FROM THE NECK DOWN.   Do not use on face/ open                           Wound or open sores. Avoid contact with eyes, ears mouth and genitals (private parts).                       Wash face,  Genitals (private parts) with your normal soap.  6.  Wash thoroughly, paying special attention to the area where your surgery  will be performed.  7.  Thoroughly rinse your body with warm water from the neck down.  8.  DO NOT shower/wash with your normal soap after using and rinsing off  the CHG Soap.                9.  Pat yourself dry with a clean towel.            10.  Wear clean pajamas.            11.  Place clean sheets on your bed the night of your first shower and do not  sleep with pets. Day of Surgery : Do not apply any lotions/deodorants the morning of surgery.  Please wear clean clothes to the hospital/surgery center.  FAILURE TO FOLLOW THESE INSTRUCTIONS MAY RESULT IN THE CANCELLATION OF YOUR SURGERY PATIENT SIGNATURE_________________________________  NURSE SIGNATURE__________________________________  ________________________________________________________________________

## 2021-11-11 ENCOUNTER — Other Ambulatory Visit: Payer: Self-pay

## 2021-11-11 ENCOUNTER — Encounter (HOSPITAL_COMMUNITY)
Admission: RE | Admit: 2021-11-11 | Discharge: 2021-11-11 | Disposition: A | Discharge status: Home / Self Care | Payer: Medicare Other | Source: Ambulatory Visit | Attending: Surgery | Admitting: Surgery

## 2021-11-11 ENCOUNTER — Encounter (HOSPITAL_COMMUNITY): Payer: Self-pay

## 2021-11-11 DIAGNOSIS — Z01812 Encounter for preprocedural laboratory examination: Secondary | ICD-10-CM | POA: Diagnosis not present

## 2021-11-11 NOTE — Progress Notes (Signed)
COVID test- NA    PCP - Dr. Tommie Ard Cardiologist - no  Chest x-ray - 10/21/20-epic EKG - no Stress Test - 2017 ECHO - 2017 Cardiac Cath - no Pacemaker/ICD device last checked:NA  Sleep Study - no CPAP -   Fasting Blood Sugar - NA Checks Blood Sugar _____ times a day  Blood Thinner Instructions:Eliquis/ Dr. Lysle Rubens Aspirin Instructions:Stop 2 days prior to DOS./Dr. Windle Guard Last Dose:11/13/21  Anesthesia review: yes  Patient denies shortness of breath, fever, cough and chest pain at PAT appointment Pt has SOB with most activities. She had a PE in 2011 then a IVP filter placed. She is Factor V leiden and chronicaly anemic her H&H on 1/19 was 9.8/32.7  Patient verbalized understanding of instructions that were given to them at the PAT appointment. Patient was also instructed that they will need to review over the PAT instructions again at home before surgery. Yes. She was here with her daughter.

## 2021-11-16 ENCOUNTER — Ambulatory Visit (HOSPITAL_COMMUNITY): Payer: Medicare Other | Admitting: Physician Assistant

## 2021-11-16 ENCOUNTER — Other Ambulatory Visit: Payer: Self-pay

## 2021-11-16 ENCOUNTER — Ambulatory Visit (HOSPITAL_COMMUNITY): Payer: Medicare Other | Admitting: Anesthesiology

## 2021-11-16 ENCOUNTER — Encounter (HOSPITAL_COMMUNITY)
Admission: RE | Disposition: A | Discharge status: Home / Self Care | Payer: Self-pay | Source: Ambulatory Visit | Attending: Surgery

## 2021-11-16 ENCOUNTER — Encounter (HOSPITAL_COMMUNITY): Payer: Self-pay | Admitting: Surgery

## 2021-11-16 ENCOUNTER — Observation Stay (HOSPITAL_COMMUNITY)
Admission: RE | Admit: 2021-11-16 | Discharge: 2021-11-17 | Disposition: A | Discharge status: Home / Self Care | Payer: Medicare Other | Source: Ambulatory Visit | Attending: Surgery | Admitting: Surgery

## 2021-11-16 DIAGNOSIS — Z7901 Long term (current) use of anticoagulants: Secondary | ICD-10-CM | POA: Diagnosis not present

## 2021-11-16 DIAGNOSIS — K811 Chronic cholecystitis: Secondary | ICD-10-CM | POA: Diagnosis not present

## 2021-11-16 DIAGNOSIS — K8010 Calculus of gallbladder with chronic cholecystitis without obstruction: Secondary | ICD-10-CM | POA: Diagnosis not present

## 2021-11-16 DIAGNOSIS — Z79899 Other long term (current) drug therapy: Secondary | ICD-10-CM | POA: Insufficient documentation

## 2021-11-16 DIAGNOSIS — K449 Diaphragmatic hernia without obstruction or gangrene: Secondary | ICD-10-CM | POA: Diagnosis not present

## 2021-11-16 DIAGNOSIS — N183 Chronic kidney disease, stage 3 unspecified: Secondary | ICD-10-CM | POA: Insufficient documentation

## 2021-11-16 DIAGNOSIS — I129 Hypertensive chronic kidney disease with stage 1 through stage 4 chronic kidney disease, or unspecified chronic kidney disease: Secondary | ICD-10-CM | POA: Insufficient documentation

## 2021-11-16 DIAGNOSIS — Z9049 Acquired absence of other specified parts of digestive tract: Secondary | ICD-10-CM

## 2021-11-16 DIAGNOSIS — K8012 Calculus of gallbladder with acute and chronic cholecystitis without obstruction: Secondary | ICD-10-CM | POA: Diagnosis not present

## 2021-11-16 DIAGNOSIS — Z86718 Personal history of other venous thrombosis and embolism: Secondary | ICD-10-CM | POA: Insufficient documentation

## 2021-11-16 DIAGNOSIS — I2699 Other pulmonary embolism without acute cor pulmonale: Secondary | ICD-10-CM | POA: Diagnosis not present

## 2021-11-16 DIAGNOSIS — I10 Essential (primary) hypertension: Secondary | ICD-10-CM | POA: Diagnosis not present

## 2021-11-16 SURGERY — CHOLECYSTECTOMY, ROBOT-ASSISTED, LAPAROSCOPIC
Anesthesia: General

## 2021-11-16 MED ORDER — FENTANYL CITRATE PF 50 MCG/ML IJ SOSY: 12.5000 ug | PREFILLED_SYRINGE | Freq: Four times a day (QID) | INTRAMUSCULAR | Status: DC | PRN

## 2021-11-16 MED ORDER — ONDANSETRON HCL 4 MG/2ML IJ SOLN
INTRAMUSCULAR | Status: AC
  Filled 2021-11-16: qty 2

## 2021-11-16 MED ORDER — EPHEDRINE SULFATE-NACL 50-0.9 MG/10ML-% IV SOSY
PREFILLED_SYRINGE | INTRAVENOUS | Status: DC | PRN
  Administered 2021-11-16 (×2): 5 mg via INTRAVENOUS

## 2021-11-16 MED ORDER — LIDOCAINE HCL (PF) 2 % IJ SOLN
INTRAMUSCULAR | Status: AC
  Filled 2021-11-16: qty 5

## 2021-11-16 MED ORDER — CHLORHEXIDINE GLUCONATE 4 % EX LIQD: 60.0000 mL | Freq: Once | CUTANEOUS | Status: DC

## 2021-11-16 MED ORDER — HYDRALAZINE HCL 20 MG/ML IJ SOLN: 10.0000 mg | INTRAMUSCULAR | Status: DC | PRN

## 2021-11-16 MED ORDER — AMLODIPINE BESYLATE 5 MG PO TABS
5.0000 mg | ORAL_TABLET | Freq: Two times a day (BID) | ORAL | Status: DC
  Administered 2021-11-16 – 2021-11-17 (×3): 5 mg via ORAL
  Filled 2021-11-16 (×3): qty 1

## 2021-11-16 MED ORDER — CEFAZOLIN SODIUM-DEXTROSE 2-4 GM/100ML-% IV SOLN
2.0000 g | INTRAVENOUS | Status: AC
  Administered 2021-11-16: 2 g via INTRAVENOUS
  Filled 2021-11-16: qty 100

## 2021-11-16 MED ORDER — LIDOCAINE 2% (20 MG/ML) 5 ML SYRINGE
INTRAMUSCULAR | Status: DC | PRN
  Administered 2021-11-16: 40 mg via INTRAVENOUS

## 2021-11-16 MED ORDER — CEFAZOLIN SODIUM-DEXTROSE 2-3 GM-%(50ML) IV SOLR: INTRAVENOUS | Status: DC | PRN

## 2021-11-16 MED ORDER — BUPIVACAINE LIPOSOME 1.3 % IJ SUSP
INTRAMUSCULAR | Status: DC | PRN
  Administered 2021-11-16: 20 mL

## 2021-11-16 MED ORDER — CHLORHEXIDINE GLUCONATE 0.12 % MT SOLN
15.0000 mL | Freq: Once | OROMUCOSAL | Status: AC
  Administered 2021-11-16: 15 mL via OROMUCOSAL

## 2021-11-16 MED ORDER — ONDANSETRON HCL 4 MG/2ML IJ SOLN
INTRAMUSCULAR | Status: DC | PRN
  Administered 2021-11-16: 4 mg via INTRAVENOUS

## 2021-11-16 MED ORDER — PROPOFOL 10 MG/ML IV BOLUS
INTRAVENOUS | Status: DC | PRN
  Administered 2021-11-16: 60 mg via INTRAVENOUS

## 2021-11-16 MED ORDER — FENTANYL CITRATE (PF) 100 MCG/2ML IJ SOLN
INTRAMUSCULAR | Status: AC
  Filled 2021-11-16: qty 2

## 2021-11-16 MED ORDER — BUPIVACAINE LIPOSOME 1.3 % IJ SUSP: 20.0000 mL | Freq: Once | INTRAMUSCULAR | Status: DC

## 2021-11-16 MED ORDER — SUGAMMADEX SODIUM 200 MG/2ML IV SOLN
INTRAVENOUS | Status: DC | PRN
  Administered 2021-11-16: 150 mg via INTRAVENOUS

## 2021-11-16 MED ORDER — PROPOFOL 10 MG/ML IV BOLUS
INTRAVENOUS | Status: AC
  Filled 2021-11-16: qty 20

## 2021-11-16 MED ORDER — IBUPROFEN 200 MG PO TABS: 200.0000 mg | ORAL_TABLET | Freq: Four times a day (QID) | ORAL | Status: DC | PRN

## 2021-11-16 MED ORDER — BUPIVACAINE LIPOSOME 1.3 % IJ SUSP
INTRAMUSCULAR | Status: AC
  Filled 2021-11-16: qty 20

## 2021-11-16 MED ORDER — INDOCYANINE GREEN 25 MG IV SOLR
2.5000 mg | Freq: Once | INTRAVENOUS | Status: AC
  Administered 2021-11-16: 2.5 mg via INTRAVENOUS
  Filled 2021-11-16: qty 1

## 2021-11-16 MED ORDER — TRAMADOL HCL 50 MG PO TABS: 50.0000 mg | ORAL_TABLET | Freq: Four times a day (QID) | ORAL | Status: DC | PRN

## 2021-11-16 MED ORDER — 0.9 % SODIUM CHLORIDE (POUR BTL) OPTIME
TOPICAL | Status: DC | PRN
  Administered 2021-11-16: 1000 mL

## 2021-11-16 MED ORDER — ALBUTEROL SULFATE (2.5 MG/3ML) 0.083% IN NEBU: 2.5000 mg | INHALATION_SOLUTION | Freq: Four times a day (QID) | RESPIRATORY_TRACT | Status: DC | PRN

## 2021-11-16 MED ORDER — ACETAMINOPHEN 500 MG PO TABS
1000.0000 mg | ORAL_TABLET | Freq: Four times a day (QID) | ORAL | Status: DC
  Administered 2021-11-16 – 2021-11-17 (×3): 1000 mg via ORAL
  Filled 2021-11-16 (×3): qty 2

## 2021-11-16 MED ORDER — ROCURONIUM BROMIDE 10 MG/ML (PF) SYRINGE
PREFILLED_SYRINGE | INTRAVENOUS | Status: AC
  Filled 2021-11-16: qty 10

## 2021-11-16 MED ORDER — METOPROLOL TARTRATE 5 MG/5ML IV SOLN: 5.0000 mg | Freq: Four times a day (QID) | INTRAVENOUS | Status: DC | PRN

## 2021-11-16 MED ORDER — DEXAMETHASONE SODIUM PHOSPHATE 10 MG/ML IJ SOLN
INTRAMUSCULAR | Status: AC
  Filled 2021-11-16: qty 1

## 2021-11-16 MED ORDER — PANTOPRAZOLE SODIUM 40 MG IV SOLR
40.0000 mg | Freq: Every day | INTRAVENOUS | Status: DC
  Administered 2021-11-16: 40 mg via INTRAVENOUS
  Filled 2021-11-16: qty 40

## 2021-11-16 MED ORDER — LACTATED RINGERS IV SOLN
INTRAVENOUS | Status: AC | PRN
  Administered 2021-11-16: 1000 mL

## 2021-11-16 MED ORDER — PHENYLEPHRINE HCL (PRESSORS) 10 MG/ML IV SOLN
INTRAVENOUS | Status: AC
  Filled 2021-11-16: qty 1

## 2021-11-16 MED ORDER — DOCUSATE SODIUM 100 MG PO CAPS
100.0000 mg | ORAL_CAPSULE | Freq: Two times a day (BID) | ORAL | Status: DC
  Administered 2021-11-16 – 2021-11-17 (×3): 100 mg via ORAL
  Filled 2021-11-16 (×3): qty 1

## 2021-11-16 MED ORDER — BUPIVACAINE-EPINEPHRINE 0.25% -1:200000 IJ SOLN
INTRAMUSCULAR | Status: DC | PRN
  Administered 2021-11-16: 30 mL

## 2021-11-16 MED ORDER — FENTANYL CITRATE (PF) 250 MCG/5ML IJ SOLN
INTRAMUSCULAR | Status: DC | PRN
  Administered 2021-11-16: 50 ug via INTRAVENOUS
  Administered 2021-11-16: 25 ug via INTRAVENOUS
  Administered 2021-11-16: 50 ug via INTRAVENOUS
  Administered 2021-11-16: 25 ug via INTRAVENOUS

## 2021-11-16 MED ORDER — FENTANYL CITRATE PF 50 MCG/ML IJ SOSY: 25.0000 ug | PREFILLED_SYRINGE | INTRAMUSCULAR | Status: DC | PRN

## 2021-11-16 MED ORDER — ONDANSETRON HCL 4 MG/2ML IJ SOLN: 4.0000 mg | Freq: Four times a day (QID) | INTRAMUSCULAR | Status: DC | PRN

## 2021-11-16 MED ORDER — DEXAMETHASONE SODIUM PHOSPHATE 10 MG/ML IJ SOLN
INTRAMUSCULAR | Status: DC | PRN
  Administered 2021-11-16: 4 mg via INTRAVENOUS

## 2021-11-16 MED ORDER — ACETAMINOPHEN 500 MG PO TABS
1000.0000 mg | ORAL_TABLET | ORAL | Status: AC
  Administered 2021-11-16: 1000 mg via ORAL
  Filled 2021-11-16: qty 2

## 2021-11-16 MED ORDER — LACTATED RINGERS IV SOLN: INTRAVENOUS | Status: DC

## 2021-11-16 MED ORDER — NAPHAZOLINE-GLYCERIN 0.012-0.25 % OP SOLN
1.0000 [drp] | Freq: Four times a day (QID) | OPHTHALMIC | Status: DC | PRN
  Filled 2021-11-16: qty 15

## 2021-11-16 MED ORDER — BUPIVACAINE-EPINEPHRINE (PF) 0.25% -1:200000 IJ SOLN
INTRAMUSCULAR | Status: AC
  Filled 2021-11-16: qty 30

## 2021-11-16 MED ORDER — ROCURONIUM BROMIDE 10 MG/ML (PF) SYRINGE
PREFILLED_SYRINGE | INTRAVENOUS | Status: DC | PRN
  Administered 2021-11-16: 50 mg via INTRAVENOUS
  Administered 2021-11-16: 10 mg via INTRAVENOUS

## 2021-11-16 MED ORDER — ONDANSETRON 4 MG PO TBDP: 4.0000 mg | ORAL_TABLET | Freq: Four times a day (QID) | ORAL | Status: DC | PRN

## 2021-11-16 MED ORDER — ORAL CARE MOUTH RINSE: 15.0000 mL | Freq: Once | OROMUCOSAL | Status: AC

## 2021-11-16 MED ORDER — ENOXAPARIN SODIUM 40 MG/0.4ML IJ SOSY
40.0000 mg | PREFILLED_SYRINGE | INTRAMUSCULAR | Status: DC
  Administered 2021-11-17: 40 mg via SUBCUTANEOUS
  Filled 2021-11-16: qty 0.4

## 2021-11-16 SURGICAL SUPPLY — 54 items
ADH SKN CLS APL DERMABOND .7 (GAUZE/BANDAGES/DRESSINGS) ×1
APL PRP STRL LF DISP 70% ISPRP (MISCELLANEOUS) ×1
APPLIER CLIP 5 13 M/L LIGAMAX5 (MISCELLANEOUS)
APR CLP MED LRG 5 ANG JAW (MISCELLANEOUS)
BAG COUNTER SPONGE SURGICOUNT (BAG) ×2 IMPLANT
BAG SPNG CNTER NS LX DISP (BAG) ×1
BLADE SURG SZ11 CARB STEEL (BLADE) ×2 IMPLANT
CHLORAPREP W/TINT 26 (MISCELLANEOUS) ×2 IMPLANT
CLIP APPLIE 5 13 M/L LIGAMAX5 (MISCELLANEOUS) IMPLANT
CLIP LIGATING HEM O LOK PURPLE (MISCELLANEOUS) ×2 IMPLANT
COVER TIP SHEARS 8 DVNC (MISCELLANEOUS) ×1 IMPLANT
COVER TIP SHEARS 8MM DA VINCI (MISCELLANEOUS) ×2
DECANTER SPIKE VIAL GLASS SM (MISCELLANEOUS) ×2 IMPLANT
DERMABOND ADVANCED (GAUZE/BANDAGES/DRESSINGS) ×1
DERMABOND ADVANCED .7 DNX12 (GAUZE/BANDAGES/DRESSINGS) IMPLANT
DRAPE ARM DVNC X/XI (DISPOSABLE) ×4 IMPLANT
DRAPE COLUMN DVNC XI (DISPOSABLE) ×1 IMPLANT
DRAPE DA VINCI XI ARM (DISPOSABLE) ×8
DRAPE DA VINCI XI COLUMN (DISPOSABLE) ×2
ELECT REM PT RETURN 15FT ADLT (MISCELLANEOUS) ×2 IMPLANT
GLOVE SURG ENC MOIS LTX SZ6 (GLOVE) ×4 IMPLANT
GLOVE SURG MICRO LTX SZ6 (GLOVE) ×2 IMPLANT
GLOVE SURG UNDER LTX SZ6.5 (GLOVE) ×4 IMPLANT
GOWN STRL REUS W/TWL LRG LVL3 (GOWN DISPOSABLE) ×4 IMPLANT
GOWN STRL REUS W/TWL XL LVL3 (GOWN DISPOSABLE) IMPLANT
GRASPER SUT TROCAR 14GX15 (MISCELLANEOUS) ×2 IMPLANT
IRRIG SUCT STRYKERFLOW 2 WTIP (MISCELLANEOUS)
IRRIGATION SUCT STRKRFLW 2 WTP (MISCELLANEOUS) IMPLANT
KIT BASIN OR (CUSTOM PROCEDURE TRAY) ×2 IMPLANT
KIT PROCEDURE DA VINCI SI (MISCELLANEOUS)
KIT PROCEDURE DVNC SI (MISCELLANEOUS) IMPLANT
KIT TURNOVER KIT A (KITS) IMPLANT
MANIFOLD NEPTUNE II (INSTRUMENTS) ×2 IMPLANT
NDL INSUFFLATION 14GA 120MM (NEEDLE) ×1 IMPLANT
NEEDLE HYPO 22GX1.5 SAFETY (NEEDLE) ×2 IMPLANT
NEEDLE INSUFFLATION 14GA 120MM (NEEDLE) ×2 IMPLANT
PACK CARDIOVASCULAR III (CUSTOM PROCEDURE TRAY) ×2 IMPLANT
SEAL CANN UNIV 5-8 DVNC XI (MISCELLANEOUS) ×4 IMPLANT
SEAL XI 5MM-8MM UNIVERSAL (MISCELLANEOUS) ×8
SEALER VESSEL DA VINCI XI (MISCELLANEOUS)
SEALER VESSEL EXT DVNC XI (MISCELLANEOUS) IMPLANT
SOL ANTI FOG 6CC (MISCELLANEOUS) ×1 IMPLANT
SOLUTION ANTI FOG 6CC (MISCELLANEOUS) ×1
SOLUTION ELECTROLUBE (MISCELLANEOUS) ×2 IMPLANT
SPONGE T-LAP 18X18 ~~LOC~~+RFID (SPONGE) ×2 IMPLANT
SUT MNCRL AB 4-0 PS2 18 (SUTURE) ×2 IMPLANT
SUT VICRYL 0 UR6 27IN ABS (SUTURE) ×1 IMPLANT
SYR 20ML LL LF (SYRINGE) ×2 IMPLANT
SYS RETRIEVAL 5MM INZII UNIV (BASKET) ×2
SYSTEM RETRIEVL 5MM INZII UNIV (BASKET) ×1 IMPLANT
TOWEL OR 17X26 10 PK STRL BLUE (TOWEL DISPOSABLE) ×2 IMPLANT
TOWEL OR NON WOVEN STRL DISP B (DISPOSABLE) ×2 IMPLANT
TRAY FOLEY MTR SLVR 16FR STAT (SET/KITS/TRAYS/PACK) IMPLANT
TUBING INSUFFLATION 10FT LAP (TUBING) ×2 IMPLANT

## 2021-11-16 NOTE — Interval H&P Note (Signed)
History and Physical Interval Note:  11/16/2021 9:12 AM  Phillips Climes  has presented today for surgery, with the diagnosis of CHOLECYSTITIS.  The various methods of treatment have been discussed with the patient and family. After consideration of risks, benefits and other options for treatment, the patient has consented to  Procedure(s): XI ROBOTIC [place] (N/A) as a surgical intervention.  The patient's history has been reviewed, patient examined, no change in status, stable for surgery.  I have reviewed the patient's chart and labs.  Questions were answered to the patient's satisfaction.     Anderson Middlebrooks Rich Brave

## 2021-11-16 NOTE — Anesthesia Procedure Notes (Signed)
Procedure Name: Intubation Date/Time: 11/16/2021 11:39 AM Performed by: Eben Burow, CRNA Pre-anesthesia Checklist: Patient identified, Emergency Drugs available, Suction available, Patient being monitored and Timeout performed Patient Re-evaluated:Patient Re-evaluated prior to induction Oxygen Delivery Method: Circle system utilized Preoxygenation: Pre-oxygenation with 100% oxygen Induction Type: IV induction Ventilation: Mask ventilation without difficulty Laryngoscope Size: Mac and 4 Grade View: Grade I Tube type: Oral Number of attempts: 1 Airway Equipment and Method: Stylet Placement Confirmation: ETT inserted through vocal cords under direct vision, positive ETCO2 and breath sounds checked- equal and bilateral Secured at: 20 cm Tube secured with: Tape Dental Injury: Teeth and Oropharynx as per pre-operative assessment

## 2021-11-16 NOTE — Anesthesia Preprocedure Evaluation (Addendum)
Anesthesia Evaluation  Patient identified by MRN, date of birth, ID band Patient awake    Reviewed: Allergy & Precautions, H&P , NPO status , Patient's Chart, lab work & pertinent test results  Airway Mallampati: II  TM Distance: >3 FB Neck ROM: Full    Dental no notable dental hx. (+) Edentulous Upper, Edentulous Lower, Dental Advisory Given   Pulmonary neg pulmonary ROS, PE   Pulmonary exam normal breath sounds clear to auscultation       Cardiovascular hypertension, Pt. on medications  Rhythm:Regular Rate:Normal     Neuro/Psych negative neurological ROS  negative psych ROS   GI/Hepatic Neg liver ROS, hiatal hernia, GERD  Medicated,  Endo/Other  negative endocrine ROS  Renal/GU negative Renal ROS  negative genitourinary   Musculoskeletal  (+) Arthritis , Osteoarthritis,    Abdominal   Peds  Hematology negative hematology ROS (+)   Anesthesia Other Findings   Reproductive/Obstetrics negative OB ROS                            Anesthesia Physical Anesthesia Plan  ASA: 3  Anesthesia Plan: General   Post-op Pain Management: Tylenol PO (pre-op)   Induction: Intravenous  PONV Risk Score and Plan: 4 or greater and Ondansetron, Dexamethasone and Treatment may vary due to age or medical condition  Airway Management Planned: Oral ETT  Additional Equipment:   Intra-op Plan:   Post-operative Plan: Extubation in OR  Informed Consent: I have reviewed the patients History and Physical, chart, labs and discussed the procedure including the risks, benefits and alternatives for the proposed anesthesia with the patient or authorized representative who has indicated his/her understanding and acceptance.     Dental advisory given  Plan Discussed with: CRNA  Anesthesia Plan Comments:         Anesthesia Quick Evaluation

## 2021-11-16 NOTE — Op Note (Signed)
Operative Note  Sara Rodriguez 88 y.o. female [id number]  11/16/2021  Surgeon: Clovis Riley MD FACS  Procedure performed: Robotic Cholecystectomy  Preop diagnosis: biliary colic Post-op diagnosis/intraop findings: chronic cholecystitis  Specimens: gallbladder  Retained items: none  EBL: minimal  Complications: none  Description of procedure:  After obtaining informed consent the patient was brought to the operating room. Antibiotics were administered. SCD's were applied. General endotracheal anesthesia was initiated and a formal time-out was performed. The abdomen was prepped and draped in the usual sterile fashion and peritoneal access gained using a left subcostal veress needle after instilling the site with local. Insufflation to 34mmHg was obtained, periumbilical 64mm trocar and camera inserted, and gross inspection revealed no evidence of injury from our entry or other intraabdominal abnormalities.  Bilateral laparoscopic assisted taps blocks were performed with Exparel mixed with quarter percent Marcaine.  Three additional 8 mm robotictrocars were introduced in the right midclavicular and right anterior axillary lines under direct visualization and following infiltration with local.  The robot was then docked and instruments inserted under direct visualization.   The gallbladder was retracted cephalad and the infundibulum was retracted laterally. The gallbladder appears pale and chronically inflamed. The liver is dark purple in color with no visible masses or lesions. A combination of hook electrocautery and blunt dissection was utilized to clear the peritoneum from the neck and cystic duct, circumferentially isolating the cystic artery and cystic duct and lifting the gallbladder from the cystic plate. The critical view of safety was achieved with the cystic artery, cystic duct, and liver bed visualized between them with no other structures.  This was concordant with the view on  firefly.  The artery was clipped with a single clip proximally and distally and divided as was the cystic duct with three clips on the proximal end. The gallbladder was dissected from the liver plate using electrocautery.  Hemostasis along the liver bed was ensured with cautery as the dissection progressed. Once freed the gallbladder was placed in an endocatch bag and removed intact through the left upper quadrant trocar site.  Hemostasis was once again confirmed, and reinspection of the abdomen revealed no injuries. The stomach and region of the hiatal hernia were inspected and there was no evidence of incarceration or volvulus. The clips were well opposed without any bile leak from the duct or the liver bed on direct visualization nor on firefly. The left upper quadrant extraction port site was closed with a 0 vicryl in the fascia under direct visualization using a PMI device. The abdomen was desufflated and all trocars removed. The skin incisions were closed with subcuticular monocryl and Dermabond. The patient was awakened, extubated and transported to the recovery room in stable condition.      All counts were correct at the completion of the case.

## 2021-11-16 NOTE — Transfer of Care (Signed)
Immediate Anesthesia Transfer of Care Note  Patient: Sara Rodriguez  Procedure(s) Performed: XI ROBOTIC ASSISTED LAPAROSCOPIC CHOLECYSTECTOMY  Patient Location: PACU  Anesthesia Type:General  Level of Consciousness: awake, alert  and patient cooperative  Airway & Oxygen Therapy: Patient Spontanous Breathing and Patient connected to face mask oxygen  Post-op Assessment: Report given to RN and Post -op Vital signs reviewed and stable  Post vital signs: Reviewed and stable  Last Vitals:  Vitals Value Taken Time  BP 160/56 11/16/21 1300  Temp    Pulse 72 11/16/21 1303  Resp 18 11/16/21 1303  SpO2 100 % 11/16/21 1303  Vitals shown include unvalidated device data.  Last Pain:  Vitals:   11/16/21 0936  TempSrc:   PainSc: 0-No pain         Complications: Pt in SR with PACs upon arrival to PACU, Dr Ola Spurr present, will get EKG stat. Pt stable.

## 2021-11-16 NOTE — Anesthesia Postprocedure Evaluation (Signed)
Anesthesia Post Note  Patient: Sara Rodriguez  Procedure(s) Performed: XI ROBOTIC ASSISTED LAPAROSCOPIC CHOLECYSTECTOMY     Patient location during evaluation: PACU Anesthesia Type: General Level of consciousness: awake and alert Pain management: pain level controlled Vital Signs Assessment: post-procedure vital signs reviewed and stable Respiratory status: spontaneous breathing, nonlabored ventilation, respiratory function stable and patient connected to nasal cannula oxygen Cardiovascular status: blood pressure returned to baseline and stable Postop Assessment: no apparent nausea or vomiting Anesthetic complications: no   No notable events documented.  Last Vitals:  Vitals:   11/16/21 1345 11/16/21 1406  BP: (!) 146/54 (!) 165/53  Pulse: 66 75  Resp: 14   Temp:    SpO2: 100% 99%    Last Pain:  Vitals:   11/16/21 1345  TempSrc:   PainSc: 2                  Harlea Goetzinger,W. EDMOND

## 2021-11-17 DIAGNOSIS — I129 Hypertensive chronic kidney disease with stage 1 through stage 4 chronic kidney disease, or unspecified chronic kidney disease: Secondary | ICD-10-CM | POA: Diagnosis not present

## 2021-11-17 DIAGNOSIS — Z7901 Long term (current) use of anticoagulants: Secondary | ICD-10-CM | POA: Diagnosis not present

## 2021-11-17 DIAGNOSIS — K8012 Calculus of gallbladder with acute and chronic cholecystitis without obstruction: Secondary | ICD-10-CM | POA: Diagnosis not present

## 2021-11-17 DIAGNOSIS — Z86718 Personal history of other venous thrombosis and embolism: Secondary | ICD-10-CM | POA: Diagnosis not present

## 2021-11-17 DIAGNOSIS — Z79899 Other long term (current) drug therapy: Secondary | ICD-10-CM | POA: Diagnosis not present

## 2021-11-17 DIAGNOSIS — N183 Chronic kidney disease, stage 3 unspecified: Secondary | ICD-10-CM | POA: Diagnosis not present

## 2021-11-17 HISTORY — PX: CHOLECYSTECTOMY: SHX55

## 2021-11-17 LAB — SURGICAL PATHOLOGY

## 2021-11-17 MED ORDER — TRAMADOL HCL 50 MG PO TABS
50.0000 mg | ORAL_TABLET | Freq: Four times a day (QID) | ORAL | 0 refills | Status: AC | PRN
Start: 2021-11-17 — End: 2021-11-20

## 2021-11-17 MED ORDER — DOCUSATE SODIUM 100 MG PO CAPS: 100.0000 mg | ORAL_CAPSULE | Freq: Two times a day (BID) | ORAL | 0 refills | Status: DC

## 2021-11-17 NOTE — Progress Notes (Signed)
Transition of Care [facility]) Screening Note  Patient Details  Name: Sara Rodriguez Date of Birth: 01-17-1933  Transition of Care [facility]) CM/SW Contact:    Sherie Don, LCSW Phone Number: 11/17/2021, 8:54 AM  Transition of Care Department [facility]) has reviewed patient and no TOC needs have been identified at this time. We will continue to monitor patient advancement through interdisciplinary progression rounds. If new patient transition needs arise, please place a TOC consult.

## 2021-11-17 NOTE — Discharge Instructions (Signed)
LAPAROSCOPIC SURGERY: POST OP INSTRUCTIONS   EAT Gradually transition to a high fiber diet with a fiber supplement over the next few weeks after discharge.  Start with a pureed / full liquid diet (see below)  WALK Walk an hour a day (cumulative, not all at once).  Control your pain to do that.    CONTROL PAIN Control pain so that you can walk, sleep, tolerate sneezing/coughing, go up/down stairs.  HAVE A BOWEL MOVEMENT DAILY Keep your bowels regular to avoid problems.  OK to try a laxative to override constipation.  OK to use an antidairrheal to slow down diarrhea.  Call if not better after 2 tries  CALL IF YOU HAVE PROBLEMS/CONCERNS Call if you are still struggling despite following these instructions. Call if you have concerns not answered by these instructions    DIET: Follow a light bland diet & liquids the first 24 hours after arrival home, such as soup, liquids, starches, etc.  Be sure to drink plenty of fluids.  Quickly advance to a usual solid diet within a few days.  Avoid fast food or heavy meals as your are more likely to get nauseated or have irregular bowels.  A low-sugar, high-fiber diet for the rest of your life is ideal.  Take your usually prescribed home medications unless otherwise directed.  PAIN CONTROL: Pain is best controlled by a usual combination of three different methods TOGETHER: Ice/Heat Over the counter pain medication Prescription pain medication Most patients will experience some swelling and bruising around the incisions.  Ice packs or heating pads (30-60 minutes up to 6 times a day) will help. Use ice for the first few days to help decrease swelling and bruising, then switch to heat to help relax tight/sore spots and speed recovery.  Some people prefer to use ice alone, heat alone, alternating between ice & heat.  Experiment to what works for you.  Swelling and bruising can take several weeks to resolve.   It is helpful to take an over-the-counter pain  medication regularly for the first few days: Naproxen (Aleve, etc)  Two 220mg  tabs twice a day OR Ibuprofen (Advil, etc) Three 200mg  tabs four times a day (every meal & bedtime) AND Acetaminophen (Tylenol, etc) 500-650mg  four times a day (every meal & bedtime) A  prescription for pain medication (such as oxycodone, hydrocodone, tramadol, gabapentin, methocarbamol, etc) should be given to you upon discharge.  Take your pain medication as prescribed, IF NEEDED.  If you are having problems/concerns with the prescription medicine (does not control pain, nausea, vomiting, rash, itching, etc), please call us [phone] to see if we need to switch you to a different pain medicine that will work better for you and/or control your side effect better. If you need a refill on your pain medication, please give [place] 48 hour notice.  contact your pharmacy.  They will contact our office to request authorization. Prescriptions will not be filled after 5 pm or on week-ends  Avoid getting constipated.   Between the surgery and the pain medications, it is common to experience some constipation.   Increasing fluid intake and taking a fiber supplement (such as Metamucil, Citrucel, FiberCon, MiraLax, etc) 1-2 times a day regularly will usually help prevent this problem from occurring.   A mild laxative (prune juice, Milk of Magnesia, MiraLax, etc) should be taken according to package directions if there are no bowel movements after 48 hours.   Watch out for diarrhea.   If you have many loose  bowel movements, simplify your diet to bland foods & liquids for a few days.   Stop any stool softeners and decrease your fiber supplement.   Switching to mild anti-diarrheal medications (Kayopectate, Pepto Bismol) can help.   If this worsens or does not improve, please call us.  Wash / shower every day.  You may shower over the skin glue which is waterproof  Glue will flake off after about 2 weeks.  You may leave the incision  open to air.  You may replace a dressing/Band-Aid to cover the incision for comfort if you wish.   ACTIVITIES as tolerated:   You may resume regular (light) daily activities beginning the next day--such as daily self-care, walking, climbing stairs--gradually increasing activities as tolerated.  If you can walk 30 minutes without difficulty, it is safe to try more intense activity such as jogging, treadmill, bicycling, low-impact aerobics, swimming, etc. Save the most intensive and strenuous activity for last such as sit-ups, heavy lifting, contact sports, etc  Refrain from any heavy lifting or straining until you are off narcotics for pain control.   DO NOT PUSH THROUGH PAIN.  Let pain be your guide: If it hurts to do something, don't do it.  Pain is your body warning you to avoid that activity for another week until the pain goes down. You may drive when you are no longer taking prescription pain medication, you can comfortably wear a seatbelt, and you can safely maneuver your car and apply brakes. You may have sexual intercourse when it is comfortable.  FOLLOW UP in our office Please call [facility] at [phone] to set up an appointment to see your surgeon in the office for a follow-up appointment approximately 2-3 weeks after your surgery. Make sure that you call for this appointment the day you arrive home to insure a convenient appointment time.  10. IF YOU HAVE DISABILITY OR FAMILY LEAVE FORMS, BRING THEM TO THE OFFICE FOR PROCESSING.  DO NOT GIVE THEM TO YOUR DOCTOR.   WHEN TO CALL us [phone]: Poor pain control Reactions / problems with new medications (rash/itching, nausea, etc)  Fever over 101.5 F (38.5 C) Inability to urinate Nausea and/or vomiting Worsening swelling or bruising Continued bleeding from incision. Increased pain, redness, or drainage from the incision   The clinic staff is available to answer your questions during regular business hours (8:30am-5pm).  Please  dont hesitate to call and ask to speak to one of our nurses for clinical concerns.   If you have a medical emergency, go to the nearest emergency room or call 911.  A surgeon from [facility] is always on call at the [facility], [place], [place], [place], [place]  [id number] ? MAIN: [phone] ? TOLL FREE: [phone] ?  FAX [phone] [email or web address]

## 2021-11-17 NOTE — Discharge Summary (Signed)
Physician Discharge Summary  Patient ID: Sara Rodriguez MRN: [id number] DOB/AGE: May 11, 1933 88 y.o.  Admit date: 11/16/2021 Discharge date: 11/17/2021  Admission Diagnoses: Chronic cholecystitis  Discharge Diagnoses:  Principal Problem:   S/P cholecystectomy   Discharged Condition: good  Hospital Course: She was admitted for supportive care following an uneventful robotic cholecystectomy.  On the morning of discharge the patient was having no pain or nausea, she was tolerating oral intake, and her vital signs had remained stable.  She was deemed ready for discharge  Discharge Exam: Blood pressure (!) 148/52, pulse 64, temperature (!) 97.5 F (36.4 C), temperature source Oral, resp. rate 16, height 5\' 3"  (1.6 m), weight 74.9 kg, SpO2 99 %. She is alert, well-appearing Unlabored respirations Abdomen is soft, nontender, nondistended.  Incisions are clean, dry and intact with Dermabond.  No cellulitis or hematoma No lower extremity edema  Disposition: Discharge disposition: 01-Home or Self Care        Allergies as of 11/17/2021   No Known Allergies      Medication List     STOP taking these medications    HYDROcodone-acetaminophen 5-325 MG tablet Commonly known as: NORCO/VICODIN       TAKE these medications    albuterol 108 (90 Base) MCG/ACT inhaler Commonly known as: VENTOLIN HFA Inhale 1 puff into the lungs every 6 (six) hours as needed for wheezing or shortness of breath.   amLODipine 5 MG tablet Commonly known as: NORVASC Take 1 tablet (5 mg total) by mouth daily. What changed: when to take this   apixaban 5 MG Tabs tablet Commonly known as: ELIQUIS Take 5 mg by mouth 2 (two) times daily.   atorvastatin 10 MG tablet Commonly known as: LIPITOR Take 1 tablet by mouth daily.   docusate sodium 100 MG capsule Commonly known as: Colace Take 1 capsule (100 mg total) by mouth 2 (two) times daily. Okay to decrease to once daily or stop taking if having  loose bowel movements   ferrous sulfate 324 MG Tbec Take 324 mg by mouth daily with breakfast.   Fish Oil 1000 MG Caps Take 1,000 mg by mouth daily.   hydrochlorothiazide 25 MG tablet Commonly known as: HYDRODIURIL Take 25 mg by mouth daily.   olmesartan 40 MG tablet Commonly known as: BENICAR Take 40 mg by mouth daily.   omeprazole 40 MG capsule Commonly known as: PRILOSEC Take 40 mg by mouth daily.   ondansetron 4 MG tablet Commonly known as: ZOFRAN Take 1 tablet (4 mg total) by mouth every 8 (eight) hours as needed for nausea or vomiting.   tetrahydrozoline-zinc 0.05-0.25 % ophthalmic solution Commonly known as: VISINE-AC Place 2 drops into both eyes daily as needed (dry eyes).   traMADol 50 MG tablet Commonly known as: Ultram Take 1 tablet (50 mg total) by mouth every 6 (six) hours as needed for up to 3 days (Pain not relieved by tylenol, ibuprofen, rest or ice).   Vitamin D 50 MCG (2000 UT) Caps Take 2,000 Units by mouth daily.        Follow-up Information     Clovis Riley, MD Follow up in 3 week(s).   Specialty: General Surgery Contact information: [address] [place] [address] [phone]                 Signed: Clovis Riley 11/17/2021, 8:08 AM

## 2021-11-21 ENCOUNTER — Emergency Department (HOSPITAL_COMMUNITY): Payer: Medicare Other

## 2021-11-21 ENCOUNTER — Encounter (HOSPITAL_COMMUNITY): Payer: Self-pay | Admitting: *Deleted

## 2021-11-21 ENCOUNTER — Inpatient Hospital Stay (HOSPITAL_COMMUNITY)
Admission: EM | Admit: 2021-11-21 | Discharge: 2021-11-25 | DRG: 480 | Disposition: A | Discharge status: Skilled Nursing Facility | Payer: Medicare Other | Attending: Internal Medicine | Admitting: Internal Medicine

## 2021-11-21 ENCOUNTER — Other Ambulatory Visit: Payer: Self-pay

## 2021-11-21 DIAGNOSIS — Z4789 Encounter for other orthopedic aftercare: Secondary | ICD-10-CM | POA: Diagnosis not present

## 2021-11-21 DIAGNOSIS — D649 Anemia, unspecified: Secondary | ICD-10-CM

## 2021-11-21 DIAGNOSIS — Z823 Family history of stroke: Secondary | ICD-10-CM | POA: Diagnosis not present

## 2021-11-21 DIAGNOSIS — W19XXXA Unspecified fall, initial encounter: Secondary | ICD-10-CM | POA: Diagnosis not present

## 2021-11-21 DIAGNOSIS — Z833 Family history of diabetes mellitus: Secondary | ICD-10-CM | POA: Diagnosis not present

## 2021-11-21 DIAGNOSIS — I959 Hypotension, unspecified: Secondary | ICD-10-CM | POA: Diagnosis not present

## 2021-11-21 DIAGNOSIS — K219 Gastro-esophageal reflux disease without esophagitis: Secondary | ICD-10-CM | POA: Diagnosis present

## 2021-11-21 DIAGNOSIS — I10 Essential (primary) hypertension: Secondary | ICD-10-CM | POA: Diagnosis not present

## 2021-11-21 DIAGNOSIS — D6851 Activated protein C resistance: Secondary | ICD-10-CM | POA: Diagnosis present

## 2021-11-21 DIAGNOSIS — S72002A Fracture of unspecified part of neck of left femur, initial encounter for closed fracture: Secondary | ICD-10-CM

## 2021-11-21 DIAGNOSIS — S79912A Unspecified injury of left hip, initial encounter: Secondary | ICD-10-CM | POA: Diagnosis not present

## 2021-11-21 DIAGNOSIS — Z85828 Personal history of other malignant neoplasm of skin: Secondary | ICD-10-CM | POA: Diagnosis not present

## 2021-11-21 DIAGNOSIS — E785 Hyperlipidemia, unspecified: Secondary | ICD-10-CM | POA: Diagnosis not present

## 2021-11-21 DIAGNOSIS — J189 Pneumonia, unspecified organism: Secondary | ICD-10-CM | POA: Diagnosis not present

## 2021-11-21 DIAGNOSIS — M1712 Unilateral primary osteoarthritis, left knee: Secondary | ICD-10-CM | POA: Diagnosis not present

## 2021-11-21 DIAGNOSIS — Z7401 Bed confinement status: Secondary | ICD-10-CM | POA: Diagnosis not present

## 2021-11-21 DIAGNOSIS — Y92002 Bathroom of unspecified non-institutional (private) residence single-family (private) house as the place of occurrence of the external cause: Secondary | ICD-10-CM

## 2021-11-21 DIAGNOSIS — Z96642 Presence of left artificial hip joint: Secondary | ICD-10-CM | POA: Diagnosis not present

## 2021-11-21 DIAGNOSIS — Z79899 Other long term (current) drug therapy: Secondary | ICD-10-CM

## 2021-11-21 DIAGNOSIS — W010XXA Fall on same level from slipping, tripping and stumbling without subsequent striking against object, initial encounter: Secondary | ICD-10-CM | POA: Diagnosis present

## 2021-11-21 DIAGNOSIS — D62 Acute posthemorrhagic anemia: Secondary | ICD-10-CM | POA: Diagnosis not present

## 2021-11-21 DIAGNOSIS — Z20822 Contact with and (suspected) exposure to covid-19: Secondary | ICD-10-CM | POA: Diagnosis present

## 2021-11-21 DIAGNOSIS — W19XXXD Unspecified fall, subsequent encounter: Secondary | ICD-10-CM | POA: Diagnosis not present

## 2021-11-21 DIAGNOSIS — Z86711 Personal history of pulmonary embolism: Secondary | ICD-10-CM

## 2021-11-21 DIAGNOSIS — I517 Cardiomegaly: Secondary | ICD-10-CM | POA: Diagnosis not present

## 2021-11-21 DIAGNOSIS — Z90710 Acquired absence of both cervix and uterus: Secondary | ICD-10-CM | POA: Diagnosis not present

## 2021-11-21 DIAGNOSIS — S7290XA Unspecified fracture of unspecified femur, initial encounter for closed fracture: Secondary | ICD-10-CM

## 2021-11-21 DIAGNOSIS — Z471 Aftercare following joint replacement surgery: Secondary | ICD-10-CM | POA: Diagnosis not present

## 2021-11-21 DIAGNOSIS — K449 Diaphragmatic hernia without obstruction or gangrene: Secondary | ICD-10-CM | POA: Diagnosis not present

## 2021-11-21 DIAGNOSIS — S72142A Displaced intertrochanteric fracture of left femur, initial encounter for closed fracture: Secondary | ICD-10-CM | POA: Diagnosis not present

## 2021-11-21 DIAGNOSIS — Z8249 Family history of ischemic heart disease and other diseases of the circulatory system: Secondary | ICD-10-CM | POA: Diagnosis not present

## 2021-11-21 DIAGNOSIS — M25552 Pain in left hip: Secondary | ICD-10-CM | POA: Diagnosis not present

## 2021-11-21 DIAGNOSIS — Z9049 Acquired absence of other specified parts of digestive tract: Secondary | ICD-10-CM

## 2021-11-21 DIAGNOSIS — S72002D Fracture of unspecified part of neck of left femur, subsequent encounter for closed fracture with routine healing: Secondary | ICD-10-CM | POA: Diagnosis not present

## 2021-11-21 DIAGNOSIS — Z7901 Long term (current) use of anticoagulants: Secondary | ICD-10-CM | POA: Diagnosis not present

## 2021-11-21 DIAGNOSIS — Z4889 Encounter for other specified surgical aftercare: Secondary | ICD-10-CM | POA: Diagnosis not present

## 2021-11-21 DIAGNOSIS — I272 Pulmonary hypertension, unspecified: Secondary | ICD-10-CM | POA: Diagnosis not present

## 2021-11-21 DIAGNOSIS — R079 Chest pain, unspecified: Secondary | ICD-10-CM | POA: Diagnosis not present

## 2021-11-21 LAB — CBC WITH DIFFERENTIAL/PLATELET
Abs Immature Granulocytes: 0.11 10*3/uL — ABNORMAL HIGH (ref 0.00–0.07)
Basophils Absolute: 0 10*3/uL (ref 0.0–0.1)
Basophils Relative: 0 %
Eosinophils Absolute: 0 10*3/uL (ref 0.0–0.5)
Eosinophils Relative: 0 %
HCT: 32.7 % — ABNORMAL LOW (ref 36.0–46.0)
Hemoglobin: 10.1 g/dL — ABNORMAL LOW (ref 12.0–15.0)
Immature Granulocytes: 1 %
Lymphocytes Relative: 4 %
Lymphs Abs: 0.7 10*3/uL (ref 0.7–4.0)
MCH: 25.7 pg — ABNORMAL LOW (ref 26.0–34.0)
MCHC: 30.9 g/dL (ref 30.0–36.0)
MCV: 83.2 fL (ref 80.0–100.0)
Monocytes Absolute: 1.5 10*3/uL — ABNORMAL HIGH (ref 0.1–1.0)
Monocytes Relative: 8 %
Neutro Abs: 16 10*3/uL — ABNORMAL HIGH (ref 1.7–7.7)
Neutrophils Relative %: 87 %
Platelets: 250 10*3/uL (ref 150–400)
RBC: 3.93 MIL/uL (ref 3.87–5.11)
RDW: 16.7 % — ABNORMAL HIGH (ref 11.5–15.5)
WBC: 18.5 10*3/uL — ABNORMAL HIGH (ref 4.0–10.5)
nRBC: 0 % (ref 0.0–0.2)

## 2021-11-21 LAB — BASIC METABOLIC PANEL
Anion gap: 10 (ref 5–15)
BUN: 24 mg/dL — ABNORMAL HIGH (ref 8–23)
CO2: 24 mmol/L (ref 22–32)
Calcium: 9.1 mg/dL (ref 8.9–10.3)
Chloride: 96 mmol/L — ABNORMAL LOW (ref 98–111)
Creatinine, Ser: 1.12 mg/dL — ABNORMAL HIGH (ref 0.44–1.00)
GFR, Estimated: 48 mL/min — ABNORMAL LOW (ref >60)
Glucose, Bld: 173 mg/dL — ABNORMAL HIGH (ref 70–99)
Potassium: 3.7 mmol/L (ref 3.5–5.1)
Sodium: 130 mmol/L — ABNORMAL LOW (ref 135–145)

## 2021-11-21 LAB — RESP PANEL BY RT-PCR (FLU A&B, COVID) ARPGX2
Influenza A by PCR: NEGATIVE
Influenza B by PCR: NEGATIVE
SARS Coronavirus 2 by RT PCR: NEGATIVE

## 2021-11-21 LAB — PROTIME-INR
INR: 1.2 (ref 0.8–1.2)
Prothrombin Time: 15.3 seconds — ABNORMAL HIGH (ref 11.4–15.2)

## 2021-11-21 MED ORDER — ONDANSETRON HCL 4 MG/2ML IJ SOLN
4.0000 mg | Freq: Once | INTRAMUSCULAR | Status: AC
Start: 2021-11-21 — End: 2021-11-21
  Administered 2021-11-21: 4 mg via INTRAVENOUS
  Filled 2021-11-21: qty 2

## 2021-11-21 MED ORDER — HYDROMORPHONE HCL 1 MG/ML IJ SOLN
0.5000 mg | INTRAMUSCULAR | Status: DC | PRN
  Administered 2021-11-21 – 2021-11-23 (×8): 0.5 mg via INTRAVENOUS
  Filled 2021-11-21: qty 0.5
  Filled 2021-11-21: qty 1
  Filled 2021-11-21 (×2): qty 0.5
  Filled 2021-11-21: qty 1
  Filled 2021-11-21 (×2): qty 0.5

## 2021-11-21 NOTE — ED Notes (Signed)
Patient transported to X-ray 

## 2021-11-21 NOTE — ED Provider Notes (Signed)
[place] DEPT Provider Note   CSN: [id number] Arrival date & time: 11/21/21  2221     History  Chief Complaint  Patient presents with   Hip Pain    Sara Rodriguez is a 88 y.o. female.  88 year old female with a history of hypertension, hyperlipidemia, PE 2/2 factor V Leiden (on chronic Eliquis, status post vena cava filter placement), GERD presents to the ED after a fall in her home.  Reports that she sometimes has spells of lightheadedness when she changes position.  She got up to use the bathroom when she felt "dizzy" and fell, striking her left side on the ground.  She denies head trauma as well as loss of consciousness.  She has been having constant, unchanged pain in her left hip which is worse with any movement.  Was able to crawl to the phone to call for help and was transported to the ED via EMS.  Received 200 mcg fentanyl in route for pain.  Last dose of Eliquis was taken this morning.  The history is provided by the patient. No language interpreter was used.  Hip Pain      Home Medications Prior to Admission medications   Medication Sig Start Date End Date Taking? Authorizing Provider  acetaminophen (TYLENOL) 500 MG tablet Take 500-1,000 mg by mouth every 6 (six) hours as needed for mild pain or headache.   Yes [provider]  albuterol (VENTOLIN HFA) 108 (90 Base) MCG/ACT inhaler Inhale 2 puffs into the lungs every 4 (four) hours as needed for wheezing or shortness of breath.   Yes [provider]  amLODipine (NORVASC) 5 MG tablet Take 1 tablet (5 mg total) by mouth daily. Patient taking differently: Take 5 mg by mouth 2 (two) times daily. 07/14/16 11/22/21 Yes Lorretta Harp, MD  apixaban (ELIQUIS) 5 MG TABS tablet Take 5 mg by mouth 2 (two) times daily.   Yes [provider]  atorvastatin (LIPITOR) 10 MG tablet Take 10 mg by mouth at bedtime. 01/22/16  Yes [provider]  ferrous sulfate 324 MG TBEC  Take 324 mg by mouth daily with breakfast.   Yes [provider]  hydrochlorothiazide (HYDRODIURIL) 25 MG tablet Take 25 mg by mouth daily. 06/06/20  Yes [provider]  olmesartan (BENICAR) 40 MG tablet Take 40 mg by mouth daily.   Yes [provider]  Omega-3 Fatty Acids (FISH OIL) 1000 MG CAPS Take 1,000 mg by mouth daily with breakfast.   Yes [provider]  omeprazole (PRILOSEC) 40 MG capsule Take 40 mg by mouth daily before breakfast.   Yes [provider]  tetrahydrozoline-zinc (VISINE-AC) 0.05-0.25 % ophthalmic solution Place 2 drops into both eyes daily as needed (dry eyes).   Yes [provider]  traMADol (ULTRAM) 50 MG tablet Take 50 mg by mouth every 6 (six) hours as needed (for pain).   Yes [provider]  VITAMIN D3 SUPER STRENGTH 50 MCG (2000 UT) CAPS Take 2,000 Units by mouth daily.   Yes [provider]  docusate sodium (COLACE) 100 MG capsule Take 1 capsule (100 mg total) by mouth 2 (two) times daily. Okay to decrease to once daily or stop taking if having loose bowel movements Patient not taking: Reported on 11/22/2021 11/17/21 12/17/21  Clovis Riley, MD  ondansetron (ZOFRAN) 4 MG tablet Take 1 tablet (4 mg total) by mouth every 8 (eight) hours as needed for nausea or vomiting. 7/51/70   Dorise Bullion  M, PA-C      Allergies    Patient has no known allergies.    Review of Systems   Review of Systems Ten systems reviewed and are negative for acute change, except as noted in the HPI.    Physical Exam Updated Vital Signs BP (!) 143/66 (BP Location: Left Arm)    Pulse 91    Resp 20    Wt 74.8 kg    SpO2 93%    BMI 29.23 kg/m   Physical Exam Vitals and nursing note reviewed.  Constitutional:      General: She is not in acute distress.    Appearance: She is well-developed. She is not diaphoretic.     Comments: Nontoxic appearing and in NAD  HENT:     Head: Normocephalic and atraumatic.  Eyes:      General: No scleral icterus.    Conjunctiva/sclera: Conjunctivae normal.  Cardiovascular:     Rate and Rhythm: Normal rate and regular rhythm.     Pulses: Normal pulses.     Comments: DP pulse 2+ in the LLE Pulmonary:     Effort: Pulmonary effort is normal. No respiratory distress.     Comments: Respirations even and unlabored Musculoskeletal:     Cervical back: Normal range of motion.     Comments: Warm, well perfused LLE. LLE is shortened and externally rotated compared to the right. No appreciable crepitus. There is soft tissue swelling of the proximal LLE and hip without hematoma.  Skin:    General: Skin is warm and dry.     Coloration: Skin is not pale.     Findings: No erythema or rash.  Neurological:     Mental Status: She is alert and oriented to person, place, and time.     Coordination: Coordination normal.  Psychiatric:        Behavior: Behavior normal.    ED Results / Procedures / Treatments   Labs (all labs ordered are listed, but only abnormal results are displayed) Labs Reviewed  CBC WITH DIFFERENTIAL/PLATELET - Abnormal; Notable for the following components:      Result Value   WBC 18.5 (*)    Hemoglobin 10.1 (*)    HCT 32.7 (*)    MCH 25.7 (*)    RDW 16.7 (*)    Neutro Abs 16.0 (*)    Monocytes Absolute 1.5 (*)    Abs Immature Granulocytes 0.11 (*)    All other components within normal limits  BASIC METABOLIC PANEL - Abnormal; Notable for the following components:   Sodium 130 (*)    Chloride 96 (*)    Glucose, Bld 173 (*)    BUN 24 (*)    Creatinine, Ser 1.12 (*)    GFR, Estimated 48 (*)    All other components within normal limits  PROTIME-INR - Abnormal; Notable for the following components:   Prothrombin Time 15.3 (*)    All other components within normal limits  RESP PANEL BY RT-PCR (FLU A&B, COVID) ARPGX2  MRSA NEXT GEN BY PCR, NASAL    EKG EKG Interpretation  Date/Time:  Monday November 22 2021 00:07:51 EST Ventricular Rate:  99 PR  Interval:  113 QRS Duration: 106 QT Interval:  368 QTC Calculation: 473 R Axis:   -10 Text Interpretation: Sinus rhythm Atrial premature complexes Probable left ventricular hypertrophy When compared with ECG of 11/16/2021, No significant change was found Confirmed by Delora Fuel ([id number]) on 11/22/2021 12:12:36 AM  Radiology DG Chest 1 View  Result  Date: 11/21/2021 CLINICAL DATA:  Fall, left hip pain. EXAM: CHEST  1 VIEW COMPARISON:  10/21/2020. FINDINGS: The heart is enlarged and mediastinal contours are within normal limits. Atherosclerotic calcification of the aorta is noted. A retrocardiac opacity is noted likely reflecting a moderate hiatal hernia. Mild airspace disease is present in the right upper lobe. No effusion or pneumothorax. No acute osseous abnormality. IMPRESSION: 1. Mild airspace disease in the right upper lobe, possible developing infiltrate. 2. Cardiomegaly. 3. Moderate hiatal hernia. Electronically Signed   By: Brett Fairy M.D.   On: 11/21/2021 23:39   DG Hip Unilat W or Wo Pelvis 2-3 Views Left  Result Date: 11/21/2021 CLINICAL DATA:  Fall, left hip pain. EXAM: DG HIP (WITH OR WITHOUT PELVIS) 2-3V LEFT COMPARISON:  None. FINDINGS: There is a comminuted intertrochanteric fracture with displaced fracture fragments and superior subluxation of the distal fracture fragment. The fracture extends into the proximal femoral diaphysis. No dislocation is seen. Mild degenerative changes are present at the hips bilaterally. Vascular calcifications are noted in the soft tissues. IMPRESSION: Comminuted displaced intertrochanteric fracture extending into the proximal femoral diaphysis. Electronically Signed   By: Brett Fairy M.D.   On: 11/21/2021 23:36    Procedures Procedures    Medications Ordered in ED Medications  HYDROmorphone (DILAUDID) injection 0.5 mg (0.5 mg Intravenous Given 11/22/21 0016)  diazepam (VALIUM) injection 1.25 mg (has no administration in time range)  cefTRIAXone  (ROCEPHIN) 2 g in sodium chloride 0.9 % 100 mL IVPB (has no administration in time range)  azithromycin (ZITHROMAX) 500 mg in sodium chloride 0.9 % 250 mL IVPB (has no administration in time range)  ondansetron (ZOFRAN) injection 4 mg (4 mg Intravenous Given 11/21/21 2258)    ED Course/ Medical Decision Making/ A&P Clinical Course as of 11/22/21 0046  Sun Nov 21, 2021  2339 Xray reviewed by me. C/w left hip fx. Will necessitate admission for surgical management. [KH]  Mon Nov 22, 2021  0039 Valium ordered for management of presumed muscles spasms causing persistent pain despite Dilaudid. [KH]  0045 Case discussed with Dr. Rolena Infante of orthopedics.  Okay for admission to [facility] Case discussed with Dr. Alcario Drought of [facility] who will assess the patient in the ED for admission. [KH]    Clinical Course User Index [KH] Antonietta Breach, PA-C                           Medical Decision Making Amount and/or Complexity of Data Reviewed Labs: ordered. Radiology: ordered. ECG/medicine tests: ordered.  Risk Prescription drug management. Decision regarding hospitalization.   This patient presents to the ED for concern of L hip pain s/p fall, this involves an extensive number of treatment options, and is a complaint that carries with it a high risk of complications and morbidity.  The differential diagnosis includes femur fx vs hip dislocation vs fx dislocation vs pelvic injury.   Co morbidities that complicate the patient evaluation  HTN, HLD, Factor V Leiden on chronic anticoagulation   Additional history obtained:  Additional history obtained from EMS External records from outside source obtained and reviewed including Care Everywhere   Lab Tests:  I Ordered, and personally interpreted labs.  The pertinent results include:  Leukocytosis of 18.5 suspected 2/2 fall/trauma and/or persistent stress related to pain. Anemia of 10.1, stable compared to 2 weeks ago. Creatinine 1.12, but  improved from 1.24 on 11/04/2021.   Imaging Studies ordered:  I ordered  imaging studies including Xray of the L hip, screening CXR  I independently visualized and interpreted imaging which showed displaced intertrochanteric L femur fx I agree with the radiologist interpretation   Cardiac Monitoring:  The patient was maintained on a cardiac monitor.  I personally viewed and interpreted the cardiac monitored which showed an underlying rhythm of: NSR   Medicines ordered and prescription drug management:  I ordered medication including Dilaudid for pain  Reevaluation of the patient after these medicines showed that the patient improved I have reviewed the patients home medicines and have made adjustments as needed   Consultations Obtained:  I requested consultation with the Orthopedist (Dr. Rolena Infante),  and discussed lab and imaging findings as well as pertinent plan - they recommend: admission to hospitalist service. Ortho to see in AM.   Reevaluation:  After the interventions noted above, I reevaluated the patient and found that they have : remained stable   Dispostion:  After consideration of the diagnostic results and the patients response to treatment, I feel that the patent would benefit from admission for operative management of intertrochanteric fx. Hospitalist to assess in the ED for admission.          Final Clinical Impression(s) / ED Diagnoses Final diagnoses:  Closed displaced intertrochanteric fracture of left femur, initial encounter [facility])  Fall, initial encounter    Rx / DC Orders ED Discharge Orders     None         Antonietta Breach, PA-C 90/24/09 [id number]    Delora Fuel, MD 29/92/42 [phone]

## 2021-11-21 NOTE — ED Triage Notes (Signed)
Pt arrives by [facility] from home where she lives independently.  Pt tripped and fell (attempted to catch herself) and struck her left side and has shortening and rotation of left leg.  Pt did not strike head and no LOC.  Pt had to crawl to phone to call for help, fall occurred over 2 hours ago.  Pt had stable VS for ems and was given 267mcg Fentanyl pta, pain on arrival is 8/10.  Pt is on elaquis and took last dose this am

## 2021-11-22 ENCOUNTER — Other Ambulatory Visit: Payer: Self-pay

## 2021-11-22 ENCOUNTER — Inpatient Hospital Stay (HOSPITAL_COMMUNITY): Payer: Medicare Other

## 2021-11-22 DIAGNOSIS — Z90710 Acquired absence of both cervix and uterus: Secondary | ICD-10-CM | POA: Diagnosis not present

## 2021-11-22 DIAGNOSIS — K219 Gastro-esophageal reflux disease without esophagitis: Secondary | ICD-10-CM | POA: Diagnosis present

## 2021-11-22 DIAGNOSIS — D6851 Activated protein C resistance: Secondary | ICD-10-CM

## 2021-11-22 DIAGNOSIS — W010XXA Fall on same level from slipping, tripping and stumbling without subsequent striking against object, initial encounter: Secondary | ICD-10-CM | POA: Diagnosis present

## 2021-11-22 DIAGNOSIS — J189 Pneumonia, unspecified organism: Secondary | ICD-10-CM | POA: Diagnosis present

## 2021-11-22 DIAGNOSIS — I10 Essential (primary) hypertension: Secondary | ICD-10-CM

## 2021-11-22 DIAGNOSIS — Z9049 Acquired absence of other specified parts of digestive tract: Secondary | ICD-10-CM | POA: Diagnosis not present

## 2021-11-22 DIAGNOSIS — Z20822 Contact with and (suspected) exposure to covid-19: Secondary | ICD-10-CM | POA: Diagnosis present

## 2021-11-22 DIAGNOSIS — Z823 Family history of stroke: Secondary | ICD-10-CM | POA: Diagnosis not present

## 2021-11-22 DIAGNOSIS — Z85828 Personal history of other malignant neoplasm of skin: Secondary | ICD-10-CM | POA: Diagnosis not present

## 2021-11-22 DIAGNOSIS — Z8249 Family history of ischemic heart disease and other diseases of the circulatory system: Secondary | ICD-10-CM | POA: Diagnosis not present

## 2021-11-22 DIAGNOSIS — S72142A Displaced intertrochanteric fracture of left femur, initial encounter for closed fracture: Secondary | ICD-10-CM | POA: Diagnosis present

## 2021-11-22 DIAGNOSIS — S72002A Fracture of unspecified part of neck of left femur, initial encounter for closed fracture: Secondary | ICD-10-CM | POA: Diagnosis present

## 2021-11-22 DIAGNOSIS — Z79899 Other long term (current) drug therapy: Secondary | ICD-10-CM | POA: Diagnosis not present

## 2021-11-22 DIAGNOSIS — Y92002 Bathroom of unspecified non-institutional (private) residence single-family (private) house as the place of occurrence of the external cause: Secondary | ICD-10-CM | POA: Diagnosis not present

## 2021-11-22 DIAGNOSIS — E785 Hyperlipidemia, unspecified: Secondary | ICD-10-CM | POA: Diagnosis present

## 2021-11-22 DIAGNOSIS — D62 Acute posthemorrhagic anemia: Secondary | ICD-10-CM | POA: Diagnosis present

## 2021-11-22 DIAGNOSIS — Z833 Family history of diabetes mellitus: Secondary | ICD-10-CM | POA: Diagnosis not present

## 2021-11-22 DIAGNOSIS — Z86711 Personal history of pulmonary embolism: Secondary | ICD-10-CM | POA: Diagnosis present

## 2021-11-22 DIAGNOSIS — Z7901 Long term (current) use of anticoagulants: Secondary | ICD-10-CM | POA: Diagnosis not present

## 2021-11-22 LAB — CBC
HCT: 31 % — ABNORMAL LOW (ref 36.0–46.0)
Hemoglobin: 9.7 g/dL — ABNORMAL LOW (ref 12.0–15.0)
MCH: 26.2 pg (ref 26.0–34.0)
MCHC: 31.3 g/dL (ref 30.0–36.0)
MCV: 83.8 fL (ref 80.0–100.0)
Platelets: 241 10*3/uL (ref 150–400)
RBC: 3.7 MIL/uL — ABNORMAL LOW (ref 3.87–5.11)
RDW: 16.7 % — ABNORMAL HIGH (ref 11.5–15.5)
WBC: 14.2 10*3/uL — ABNORMAL HIGH (ref 4.0–10.5)
nRBC: 0 % (ref 0.0–0.2)

## 2021-11-22 LAB — BASIC METABOLIC PANEL
Anion gap: 9 (ref 5–15)
BUN: 26 mg/dL — ABNORMAL HIGH (ref 8–23)
CO2: 26 mmol/L (ref 22–32)
Calcium: 8.8 mg/dL — ABNORMAL LOW (ref 8.9–10.3)
Chloride: 95 mmol/L — ABNORMAL LOW (ref 98–111)
Creatinine, Ser: 1.17 mg/dL — ABNORMAL HIGH (ref 0.44–1.00)
GFR, Estimated: 45 mL/min — ABNORMAL LOW (ref >60)
Glucose, Bld: 169 mg/dL — ABNORMAL HIGH (ref 70–99)
Potassium: 4.1 mmol/L (ref 3.5–5.1)
Sodium: 130 mmol/L — ABNORMAL LOW (ref 135–145)

## 2021-11-22 LAB — MRSA NEXT GEN BY PCR, NASAL: MRSA by PCR Next Gen: DETECTED — AB

## 2021-11-22 LAB — APTT
aPTT: 26 seconds (ref 24–36)
aPTT: 48 seconds — ABNORMAL HIGH (ref 24–36)
aPTT: 92 seconds — ABNORMAL HIGH (ref 24–36)

## 2021-11-22 LAB — HEPARIN LEVEL (UNFRACTIONATED): Heparin Unfractionated: >1.1 IU/mL — ABNORMAL HIGH (ref 0.30–0.70)

## 2021-11-22 MED ORDER — LACTATED RINGERS IV SOLN: INTRAVENOUS | Status: DC

## 2021-11-22 MED ORDER — SODIUM CHLORIDE 0.9 % IV SOLN
500.0000 mg | INTRAVENOUS | Status: DC
  Administered 2021-11-22 – 2021-11-25 (×4): 500 mg via INTRAVENOUS
  Filled 2021-11-22 (×4): qty 5

## 2021-11-22 MED ORDER — FISH OIL 1000 MG PO CAPS: 1000.0000 mg | ORAL_CAPSULE | Freq: Every day | ORAL | Status: DC

## 2021-11-22 MED ORDER — SODIUM CHLORIDE 0.9 % IV SOLN
2.0000 g | INTRAVENOUS | Status: DC
  Administered 2021-11-22 – 2021-11-24 (×3): 2 g via INTRAVENOUS
  Filled 2021-11-22 (×3): qty 20

## 2021-11-22 MED ORDER — PANTOPRAZOLE SODIUM 40 MG PO TBEC: 80.0000 mg | DELAYED_RELEASE_TABLET | Freq: Every day | ORAL | Status: DC

## 2021-11-22 MED ORDER — ONDANSETRON HCL 4 MG/2ML IJ SOLN
4.0000 mg | Freq: Four times a day (QID) | INTRAMUSCULAR | Status: DC | PRN
  Administered 2021-11-22 – 2021-11-23 (×3): 4 mg via INTRAVENOUS
  Filled 2021-11-22 (×3): qty 2

## 2021-11-22 MED ORDER — FERROUS SULFATE 325 (65 FE) MG PO TABS
324.0000 mg | ORAL_TABLET | Freq: Every day | ORAL | Status: DC
  Filled 2021-11-22 (×2): qty 1

## 2021-11-22 MED ORDER — POLYETHYLENE GLYCOL 3350 17 G PO PACK: 8.5000 g | PACK | Freq: Every day | ORAL | Status: DC | PRN

## 2021-11-22 MED ORDER — DIAZEPAM 5 MG/ML IJ SOLN
1.2500 mg | INTRAMUSCULAR | Status: AC | PRN
  Administered 2021-11-22 (×2): 1.25 mg via INTRAVENOUS
  Filled 2021-11-22 (×2): qty 2

## 2021-11-22 MED ORDER — AMLODIPINE BESYLATE 5 MG PO TABS
5.0000 mg | ORAL_TABLET | Freq: Two times a day (BID) | ORAL | Status: DC
  Administered 2021-11-22 – 2021-11-25 (×5): 5 mg via ORAL
  Filled 2021-11-22 (×6): qty 1

## 2021-11-22 MED ORDER — NAPHAZOLINE-GLYCERIN 0.012-0.25 % OP SOLN: 2.0000 [drp] | Freq: Four times a day (QID) | OPHTHALMIC | Status: DC | PRN

## 2021-11-22 MED ORDER — FERROUS SULFATE 325 (65 FE) MG PO TABS
325.0000 mg | ORAL_TABLET | Freq: Every day | ORAL | Status: DC
  Administered 2021-11-22 – 2021-11-25 (×3): 325 mg via ORAL
  Filled 2021-11-22 (×3): qty 1

## 2021-11-22 MED ORDER — ALUM & MAG HYDROXIDE-SIMETH 200-200-20 MG/5ML PO SUSP
15.0000 mL | ORAL | Status: DC | PRN
  Administered 2021-11-22 – 2021-11-24 (×2): 15 mL via ORAL
  Filled 2021-11-22 (×2): qty 30

## 2021-11-22 MED ORDER — ATORVASTATIN CALCIUM 10 MG PO TABS
10.0000 mg | ORAL_TABLET | Freq: Every day | ORAL | Status: DC
  Administered 2021-11-22 – 2021-11-24 (×3): 10 mg via ORAL
  Filled 2021-11-22 (×3): qty 1

## 2021-11-22 MED ORDER — HEPARIN (PORCINE) 25000 UT/250ML-% IV SOLN
1100.0000 [IU]/h | INTRAVENOUS | Status: DC
  Administered 2021-11-23: 1100 [IU]/h via INTRAVENOUS
  Filled 2021-11-22: qty 250

## 2021-11-22 MED ORDER — OMEGA-3-ACID ETHYL ESTERS 1 G PO CAPS
1.0000 g | ORAL_CAPSULE | Freq: Every day | ORAL | Status: DC
  Administered 2021-11-22 – 2021-11-25 (×3): 1 g via ORAL
  Filled 2021-11-22 (×4): qty 1

## 2021-11-22 MED ORDER — PANTOPRAZOLE SODIUM 40 MG PO TBEC
40.0000 mg | DELAYED_RELEASE_TABLET | Freq: Every day | ORAL | Status: DC
  Administered 2021-11-22 – 2021-11-25 (×3): 40 mg via ORAL
  Filled 2021-11-22 (×3): qty 1

## 2021-11-22 MED ORDER — ALBUTEROL SULFATE HFA 108 (90 BASE) MCG/ACT IN AERS: 2.0000 | INHALATION_SPRAY | RESPIRATORY_TRACT | Status: DC | PRN

## 2021-11-22 MED ORDER — CHLORHEXIDINE GLUCONATE CLOTH 2 % EX PADS
6.0000 | MEDICATED_PAD | Freq: Every day | CUTANEOUS | Status: DC
  Administered 2021-11-23: 6 via TOPICAL

## 2021-11-22 MED ORDER — ALBUTEROL SULFATE (2.5 MG/3ML) 0.083% IN NEBU: 2.5000 mg | INHALATION_SOLUTION | RESPIRATORY_TRACT | Status: DC | PRN

## 2021-11-22 MED ORDER — HYDROCODONE-ACETAMINOPHEN 5-325 MG PO TABS
1.0000 | ORAL_TABLET | Freq: Four times a day (QID) | ORAL | Status: DC | PRN
  Administered 2021-11-22: 2 via ORAL
  Administered 2021-11-22 (×2): 1 via ORAL
  Administered 2021-11-22 – 2021-11-24 (×4): 2 via ORAL
  Filled 2021-11-22: qty 2
  Filled 2021-11-22 (×2): qty 1
  Filled 2021-11-22 (×4): qty 2

## 2021-11-22 MED ORDER — MORPHINE SULFATE (PF) 2 MG/ML IV SOLN
0.5000 mg | INTRAVENOUS | Status: DC | PRN
  Filled 2021-11-22: qty 1

## 2021-11-22 MED ORDER — ADULT MULTIVITAMIN W/MINERALS CH
1.0000 | ORAL_TABLET | Freq: Every day | ORAL | Status: DC
  Administered 2021-11-22 – 2021-11-25 (×4): 1 via ORAL
  Filled 2021-11-22 (×5): qty 1

## 2021-11-22 MED ORDER — HEPARIN (PORCINE) 25000 UT/250ML-% IV SOLN
900.0000 [IU]/h | INTRAVENOUS | Status: DC
  Administered 2021-11-22: 900 [IU]/h via INTRAVENOUS
  Filled 2021-11-22: qty 250

## 2021-11-22 MED ORDER — IRBESARTAN 150 MG PO TABS
300.0000 mg | ORAL_TABLET | Freq: Every day | ORAL | Status: DC
  Administered 2021-11-22: 300 mg via ORAL
  Filled 2021-11-22: qty 2

## 2021-11-22 MED ORDER — ENSURE ENLIVE PO LIQD
237.0000 mL | Freq: Two times a day (BID) | ORAL | Status: DC
  Administered 2021-11-22: 237 mL via ORAL

## 2021-11-22 NOTE — Assessment & Plan Note (Addendum)
Status post surgical intervention on 11/23/2021 Eliquis was held on admission the resume 2 days post surgical procedure. Physical therapy evaluated the patient, she will need to go to skilled nursing facility collapse facility.  She was discharged in stable condition

## 2021-11-22 NOTE — Assessment & Plan Note (Addendum)
Chest x-ray showed possible developing right upper lobe infiltrate. She was started on IV Rocephin and azithromycin, which was transitioned to Augmentin and azithromycin which she will continue for 5 additional days an outpatient. Her culture data remain negative till date she defervesced.

## 2021-11-22 NOTE — Progress Notes (Signed)
ANTICOAGULATION CONSULT NOTE - Initial Consult  Pharmacy Consult for Heparin Indication:  hx PE- bridge therapy with off apixaban  No Known Allergies  Patient Measurements: Weight: 74.8 kg (165 lb) Heparin Dosing Weight: 68 kg  Vital Signs: BP: 143/66 (02/06 0012) Pulse Rate: 91 (02/06 0032)  Labs: Recent Labs    11/21/21 2249  HGB 10.1*  HCT 32.7*  PLT 250  LABPROT 15.3*  INR 1.2  CREATININE 1.12*    Estimated Creatinine Clearance: 34.9 mL/min (A) (by C-G formula based on SCr of 1.12 mg/dL (H)).   Medical History: Past Medical History:  Diagnosis Date   Cancer ([place])    skin cancer   Dyspnea    Factor V Leiden ([place])    pulmonary embolism 2011   GERD (gastroesophageal reflux disease)    History of hiatal hernia    Hyperlipidemia    Hypertension    Pneumonia 2020   Primary localized osteoarthritis of left knee    Pulmonary embolism ([place]) 2011    Medications:  Infusions:   azithromycin     cefTRIAXone (ROCEPHIN)  IV     lactated ringers      Assessment: 88 yo F with hx PE and Factor V Leiden on Eliquis PTA. Last dose Eliquis charted as 8/5 @ 0800. She presents with hip fracture.  Eliquis is being held in anticipation of surgery.  PMH also significant for recent surgical procedure- robotic cholecystectomy on 11/16/21. Pharmacy consulted to bridge with IV heparin. Baseline labs:  CBC- Hg low at 10.1 but stable since hospital discharge, pltc WNL.  Scr 1.12 with estimated CrCl ~88ml/min, INR 1.2, aptt/heparin level ordered.   Goal of Therapy:  Heparin level 0.3-0.7 units/ml aPTT 66-102 seconds Monitor platelets by anticoagulation protocol: Yes   Plan:  Start heparin infusion @ 900 units/hr- no bolus. Check aptt in 8hrs - Monitor using aptt until heparin level normalized/correlates with aptt Daily CBC & heparin level F/U OR timing- noted instructions to hold heparin 6hrs pre-op  Netta Cedars PharmD 11/22/2021,1:22 AM

## 2021-11-22 NOTE — ED Notes (Signed)
Pt is having severe pain in left hip, medicated pt and pt became calm and states that pain is beginning to ease off.  Covered pt with warm blankets for comfort.  Purewick is in place.  Family member is at the bedside.

## 2021-11-22 NOTE — ED Notes (Signed)
ED TO INPATIENT HANDOFF REPORT  ED Nurse Name and Phone #:   S Name/Age/Gender Phillips Climes 88 y.o. female Room/Bed: WA06/WA06  Code Status   Code Status: Full Code  Home/SNF/Other Home Patient oriented to: self, place, time, and situation Is this baseline? Yes   Triage Complete: Triage complete  Chief Complaint Closed left hip fracture, initial encounter [facility]) [S72.002A]  Triage Note Pt arrives by [facility] from home where she lives independently.  Pt tripped and fell (attempted to catch herself) and struck her left side and has shortening and rotation of left leg.  Pt did not strike head and no LOC.  Pt had to crawl to phone to call for help, fall occurred over 2 hours ago.  Pt had stable VS for ems and was given 224mcg Fentanyl pta, pain on arrival is 8/10.  Pt is on elaquis and took last dose this am   Allergies No Known Allergies  Level of Care/Admitting Diagnosis ED Disposition     ED Disposition  Admit   Condition  --   [place]: [place] [[id number]]  Level of Care: Med-Surg [16]  May admit patient to [facility] or [facility] if equivalent level of care is available:: No  Covid Evaluation: Asymptomatic Screening Protocol (No Symptoms)  Diagnosis: Closed left hip fracture, initial encounter [facility]) [[id number]]  Admitting Physician: Etta Quill [[id number]]  Attending Physician: Etta Quill [phone]  Estimated length of stay: past midnight tomorrow  Certification:: I certify this patient will need inpatient services for at least 2 midnights          B Medical/Surgery History Past Medical History:  Diagnosis Date   Cancer ([place])    skin cancer   Dyspnea    Factor V Leiden ([place])    pulmonary embolism 2011   GERD (gastroesophageal reflux disease)    History of hiatal hernia    Hyperlipidemia    Hypertension    Pneumonia 2020   Primary localized osteoarthritis of left knee    Pulmonary embolism ([place]) 2011   Past  Surgical History:  Procedure Laterality Date   ABDOMINAL HYSTERECTOMY  1973   APPENDECTOMY     age 38   CATARACT EXTRACTION Left 2018   COLONOSCOPY     KNEE ARTHROSCOPY Left 2004   [place]  2011     A IV Location/Drains/Wounds Patient Lines/Drains/Airways Status     Active Line/Drains/Airways     Name Placement date Placement time Site Days   Peripheral IV 11/21/21 20 G Right Antecubital 11/21/21  2250  Antecubital  1   Incision (Closed) 11/16/21 Abdomen Other (Comment) 11/16/21  1243  -- 6   Incision - 4 Ports Abdomen 1: Right;Lateral 2: Right;Medial 3: Left;Medial 4: Left;Lateral 11/16/21  1201  -- 6            Intake/Output Last 24 hours No intake or output data in the 24 hours ending 11/22/21 0120  Labs/Imaging Results for orders placed or performed during the hospital encounter of 11/21/21 (from the past 48 hour(s))  CBC with Differential     Status: Abnormal   Collection Time: 11/21/21 10:49 PM  Result Value Ref Range   WBC 18.5 (H) 4.0 - 10.5 K/uL   RBC 3.93 3.87 - 5.11 MIL/uL   Hemoglobin 10.1 (L) 12.0 - 15.0 g/dL   HCT 32.7 (L) 36.0 - 46.0 %   MCV 83.2 80.0 - 100.0  fL   MCH 25.7 (L) 26.0 - 34.0 pg   MCHC 30.9 30.0 - 36.0 g/dL   RDW 16.7 (H) 11.5 - 15.5 %   Platelets 250 150 - 400 K/uL   nRBC 0.0 0.0 - 0.2 %   Neutrophils Relative % 87 %   Neutro Abs 16.0 (H) 1.7 - 7.7 K/uL   Lymphocytes Relative 4 %   Lymphs Abs 0.7 0.7 - 4.0 K/uL   Monocytes Relative 8 %   Monocytes Absolute 1.5 (H) 0.1 - 1.0 K/uL   Eosinophils Relative 0 %   Eosinophils Absolute 0.0 0.0 - 0.5 K/uL   Basophils Relative 0 %   Basophils Absolute 0.0 0.0 - 0.1 K/uL   Immature Granulocytes 1 %   Abs Immature Granulocytes 0.11 (H) 0.00 - 0.07 K/uL    Comment: Performed at [facility], [place] [address] [place], [place] [address]  Basic metabolic panel     Status: Abnormal   Collection Time: 11/21/21 10:49 PM  Result  Value Ref Range   Sodium 130 (L) 135 - 145 mmol/L   Potassium 3.7 3.5 - 5.1 mmol/L   Chloride 96 (L) 98 - 111 mmol/L   CO2 24 22 - 32 mmol/L   Glucose, Bld 173 (H) 70 - 99 mg/dL    Comment: Glucose reference range applies only to samples taken after fasting for at least 8 hours.   BUN 24 (H) 8 - 23 mg/dL   Creatinine, Ser 1.12 (H) 0.44 - 1.00 mg/dL   Calcium 9.1 8.9 - 10.3 mg/dL   GFR, Estimated 48 (L) >60 mL/min    Comment: (NOTE) Calculated using the CKD-EPI Creatinine Equation (2021)    Anion gap 10 5 - 15    Comment: Performed at [facility], [place] [address] [place], [place] [address]  Protime-INR     Status: Abnormal   Collection Time: 11/21/21 10:49 PM  Result Value Ref Range   Prothrombin Time 15.3 (H) 11.4 - 15.2 seconds   INR 1.2 0.8 - 1.2    Comment: (NOTE) INR goal varies based on device and disease states. Performed at [facility], [place], [place] [address], [place], [place] [address]   Resp Panel by RT-PCR (Flu A&B, Covid) Nasopharyngeal Swab     Status: None   Collection Time: 11/21/21 10:52 PM   Specimen: Nasopharyngeal Swab; Nasopharyngeal(NP) swabs in vial transport medium  Result Value Ref Range   SARS Coronavirus 2 by RT PCR NEGATIVE NEGATIVE    Comment: (NOTE) SARS-CoV-2 target nucleic acids are NOT DETECTED.  The SARS-CoV-2 RNA is generally detectable in upper respiratory specimens during the acute phase of infection. The lowest concentration of SARS-CoV-2 viral copies this assay can detect is 138 copies/mL. A negative result does not preclude SARS-Cov-2 infection and should not be used as the sole basis for treatment or other patient management decisions. A negative result may occur with  improper specimen collection/handling, submission of specimen other than nasopharyngeal swab, presence of viral mutation(s) within the areas targeted by this assay, and inadequate number of viral copies(<138 copies/mL). A negative result  must be combined with clinical observations, patient history, and epidemiological information. The expected result is Negative.  Fact Sheet for Patients:  EntrepreneurPulse.com.au  Fact Sheet for Healthcare Providers:  IncredibleEmployment.be  This test is no t yet approved or cleared by the [place] FDA and  has been authorized for detection and/or diagnosis of SARS-CoV-2 by FDA under an Emergency Use Authorization (EUA). This  EUA will remain  in effect (meaning this test can be used) for the duration of the COVID-19 declaration under Section 564(b)(1) of the Act, 21 U.S.C.section 360bbb-3(b)(1), unless the authorization is terminated  or revoked sooner.       Influenza A by PCR NEGATIVE NEGATIVE   Influenza B by PCR NEGATIVE NEGATIVE    Comment: (NOTE) The Xpert Xpress SARS-CoV-2/FLU/RSV plus assay is intended as an aid in the diagnosis of influenza from Nasopharyngeal swab specimens and should not be used as a sole basis for treatment. Nasal washings and aspirates are unacceptable for Xpert Xpress SARS-CoV-2/FLU/RSV testing.  Fact Sheet for Patients: EntrepreneurPulse.com.au  Fact Sheet for Healthcare Providers: IncredibleEmployment.be  This test is not yet approved or cleared by the [place] FDA and has been authorized for detection and/or diagnosis of SARS-CoV-2 by FDA under an Emergency Use Authorization (EUA). This EUA will remain in effect (meaning this test can be used) for the duration of the COVID-19 declaration under Section 564(b)(1) of the Act, 21 U.S.C. section 360bbb-3(b)(1), unless the authorization is terminated or revoked.  Performed at [facility], [place] [address], [place], [place] [address]    DG Chest 1 View  Result Date: 11/21/2021 CLINICAL DATA:  Fall, left hip pain. EXAM: CHEST  1 VIEW COMPARISON:  10/21/2020. FINDINGS: The heart is enlarged and  mediastinal contours are within normal limits. Atherosclerotic calcification of the aorta is noted. A retrocardiac opacity is noted likely reflecting a moderate hiatal hernia. Mild airspace disease is present in the right upper lobe. No effusion or pneumothorax. No acute osseous abnormality. IMPRESSION: 1. Mild airspace disease in the right upper lobe, possible developing infiltrate. 2. Cardiomegaly. 3. Moderate hiatal hernia. Electronically Signed   By: Brett Fairy M.D.   On: 11/21/2021 23:39   DG Hip Unilat W or Wo Pelvis 2-3 Views Left  Result Date: 11/21/2021 CLINICAL DATA:  Fall, left hip pain. EXAM: DG HIP (WITH OR WITHOUT PELVIS) 2-3V LEFT COMPARISON:  None. FINDINGS: There is a comminuted intertrochanteric fracture with displaced fracture fragments and superior subluxation of the distal fracture fragment. The fracture extends into the proximal femoral diaphysis. No dislocation is seen. Mild degenerative changes are present at the hips bilaterally. Vascular calcifications are noted in the soft tissues. IMPRESSION: Comminuted displaced intertrochanteric fracture extending into the proximal femoral diaphysis. Electronically Signed   By: Brett Fairy M.D.   On: 11/21/2021 23:36    Pending Labs Unresulted Labs (From admission, onward)     Start     Ordered   11/22/21 0500  CBC  Tomorrow morning,   R        11/22/21 0054   11/22/21 9937  Basic metabolic panel  Tomorrow morning,   R        11/22/21 0054   11/22/21 0116  APTT  ONCE - STAT,   STAT        11/22/21 0115   11/22/21 0115  Heparin level (unfractionated)  ONCE - STAT,   STAT        11/22/21 0115   11/22/21 0045  MRSA Next Gen by PCR, Nasal  Once,   STAT        11/22/21 0044            Vitals/Pain Today's Vitals   11/22/21 0017 11/22/21 0020 11/22/21 0030 11/22/21 0032  BP:      Pulse: (!) 145  92 91  Resp: (!) 25 20    SpO2: 91%   93%  Weight:      PainSc:        Isolation Precautions No active  isolations  Medications Medications  HYDROmorphone (DILAUDID) injection 0.5 mg (0.5 mg Intravenous Given 11/22/21 0016)  diazepam (VALIUM) injection 1.25 mg (has no administration in time range)  cefTRIAXone (ROCEPHIN) 2 g in sodium chloride 0.9 % 100 mL IVPB (has no administration in time range)  azithromycin (ZITHROMAX) 500 mg in sodium chloride 0.9 % 250 mL IVPB (has no administration in time range)  HYDROcodone-acetaminophen (NORCO/VICODIN) 5-325 MG per tablet 1-2 tablet (has no administration in time range)  morphine (PF) 2 MG/ML injection 0.5 mg (has no administration in time range)  lactated ringers infusion (has no administration in time range)  atorvastatin (LIPITOR) tablet 10 mg (has no administration in time range)  irbesartan (AVAPRO) tablet 300 mg (has no administration in time range)  amLODipine (NORVASC) tablet 5 mg (has no administration in time range)  ferrous sulfate tablet 324 mg (has no administration in time range)  pantoprazole (PROTONIX) EC tablet 80 mg (has no administration in time range)  polyethylene glycol (MIRALAX / GLYCOLAX) packet 8.5 g (has no administration in time range)  naphazoline-glycerin (CLEAR EYES REDNESS) ophth solution 2 drop (has no administration in time range)  albuterol (PROVENTIL) (2.5 MG/3ML) 0.083% nebulizer solution 2.5 mg (has no administration in time range)  omega-3 acid ethyl esters (LOVAZA) capsule 1 g (has no administration in time range)  ondansetron (ZOFRAN) injection 4 mg (4 mg Intravenous Given 11/21/21 2258)    Mobility Walks (prior to fracture) High fall risk   Focused Assessments    R Recommendations: See Admitting Provider Note  Report given to:   Additional Notes:

## 2021-11-22 NOTE — Progress Notes (Addendum)
Asked to assume care by Dr. Rolena Infante for L IT femur fx. Surgical stabilization required. Patients takes Eliquis for hypercoagulable state. Admitted by [facility], started on heparin gtt. Plan for surgery tomorrow to allow Eliquis washout. D/c heparin gtt at 10:30 am tomorrow. NPO after MN tonight. Full consult tomorrow.

## 2021-11-22 NOTE — Assessment & Plan Note (Addendum)
Heparin discontinued started back on Eliquis.

## 2021-11-22 NOTE — TOC Initial Note (Signed)
Transition of Care [facility]) - Initial/Assessment Note    Patient Details  Name: Sara Rodriguez MRN: [id number] Date of Birth: October 04, 1933  Transition of Care [facility]) CM/SW Contact:    Amada Jupiter, LCSW Phone Number: 11/22/2021, 2:47 PM  Clinical Narrative:                 Met with pt and two daughters in the room to introduce self/ TOC role with dc planning.  All reporting that pt's surgery now planned for tomorrow afternoon.  They confirm that pt does live alone and goal is for her to return home if possible.  Daughters are able to assist and possibly stay with pt at dc.  Explained that TOC will follow along and assist with dc - pt and family agreeable.  Expected Discharge Plan: Home w Home Health Services Barriers to Discharge: Continued Medical Work up   Patient Goals and CMS Choice Patient states their goals for this hospitalization and ongoing recovery are:: return home      Expected Discharge Plan and Services Expected Discharge Plan: Home w Home Health Services In-house Referral: Clinical Social Work     Living arrangements for the past 2 months: Single Family Home                                      Prior Living Arrangements/Services Living arrangements for the past 2 months: Single Family Home Lives with:: Self Patient language and need for interpreter reviewed:: Yes Do you feel safe going back to the place where you live?: Yes      Need for Family Participation in Patient Care: Yes (Comment) Care giver support system in place?: Yes (comment)   Criminal Activity/Legal Involvement Pertinent to Current Situation/Hospitalization: No - Comment as needed  Activities of Daily Living Home Assistive Devices/Equipment: Dentures (specify type), Eyeglasses ADL Screening (condition at time of admission) Patient's cognitive ability adequate to safely complete daily activities?: Yes Is the patient deaf or have difficulty hearing?: No Does the patient have difficulty  seeing, even when wearing glasses/contacts?: No Does the patient have difficulty concentrating, remembering, or making decisions?: No Patient able to express need for assistance with ADLs?: Yes Does the patient have difficulty dressing or bathing?: No Independently performs ADLs?: Yes (appropriate for developmental age) Does the patient have difficulty walking or climbing stairs?: No Weakness of Legs: Left Weakness of Arms/Hands: None  Permission Sought/Granted Permission sought to share information with : Family Supports Permission granted to share information with : Yes, Verbal Permission Granted  Share Information with NAME: Ellan Lambert     Permission granted to share info w Relationship: daughter  Permission granted to share info w Contact Information: [phone]  Emotional Assessment Appearance:: Appears stated age Attitude/Demeanor/Rapport: Gracious Affect (typically observed): Accepting Orientation: : Oriented to Self, Oriented to Place, Oriented to  Time, Oriented to Situation Alcohol / Substance Use: Not Applicable Psych Involvement: No (comment)  Admission diagnosis:  Closed displaced intertrochanteric fracture of left femur, initial encounter (HCC) [S72.142A] Fall, initial encounter [W19.XXXA] Closed left hip fracture, initial encounter [facility]) [S72.002A] Patient Active Problem List   Diagnosis Date Noted   History of pulmonary embolism 11/22/2021   Right upper lobe pneumonia 11/22/2021   Closed left hip fracture, initial encounter (HCC) 11/22/2021   S/P cholecystectomy 11/16/2021   Primary localized osteoarthritis of left knee    Hypertension    Factor V Leiden (HCC)  Essential hypertension 03/25/2016   Hyperlipidemia 03/25/2016   Dyspnea on exertion 03/25/2016   PULMONARY EMBOLISM 01/14/2010   PLEURAL EFFUSION, RIGHT 01/14/2010   MASS, LUNG 01/14/2010   Pulmonary embolism ([place]) 10/17/2009   PCP:  Wenda Low, MD Pharmacy:   [facility], [place] [address] Phone: [phone] Fax: [phone]  [facility]) - [place], [address] [place] [address] Phone: [phone] Fax: [place], [place] - [place] [address] [place] [address] Phone: [phone] Fax: [phone]     Social Determinants of Health (SDOH) Interventions    Readmission Risk Interventions Readmission Risk Prevention Plan 11/22/2021  Transportation Screening Complete  PCP or Specialist Appt within 5-7 Days Complete  Home Care Screening Complete  Medication Review (RN CM) Complete  Some recent data might be hidden

## 2021-11-22 NOTE — Progress Notes (Signed)
Initial Nutrition Assessment  INTERVENTION:   -Ensure Plus High Protein po BID, each supplement provides 350 kcal and 20 grams of protein.   -Multivitamin with minerals daily  NUTRITION DIAGNOSIS:   Increased nutrient needs related to hip fracture, post-op healing as evidenced by estimated needs.  GOAL:   Patient will meet greater than or equal to 90% of their needs  MONITOR:   PO intake, Supplement acceptance, Labs, Weight trends, I & O's  REASON FOR ASSESSMENT:   Consult Hip fracture protocol  ASSESSMENT:   88 y.o. female with medical history significant of PE, factor v Leiden, on eliquis, HTN, and recent cholecystectomy x7 days ago.  Patient in room, daughter at bedside. Pt is currently on a diet today. Will be NPO after midnight for potential surgery to repair left hip fracture. Pt is s/p lap cholecystectomy on 1/31.  Prior to that surgery, pt was reporting having nausea and pain for around 6 months. She has had poor appetite.  Pt reports mainly snacking throughout the day on chips, fruits, vegetables and cheese puffs. Pt does not consume a lot of meats. She does drink Ensure, will order for this admission.   Pt reports she used to weigh >200 lbs. Per pt's daughter this was a while ago and pt has lost weight progressively.  Current weight: 164 lbs.  Medications: Ferrous sulfate, Lovaza, Lactated ringers, Zofran  Labs reviewed:  Low Na  NUTRITION - FOCUSED PHYSICAL EXAM:  Flowsheet Row Most Recent Value  Orbital Region No depletion  Upper Arm Region No depletion  Thoracic and Lumbar Region No depletion  Temple Region No depletion  Clavicle Bone Region Mild depletion  Clavicle and Acromion Bone Region Mild depletion  Scapular Bone Region No depletion  Dorsal Hand Mild depletion  Patellar Region No depletion  Anterior Thigh Region No depletion  Posterior Calf Region No depletion  Edema (RD Assessment) None  Hair Reviewed  Eyes Reviewed   Mouth Reviewed   [dry] no teeth, uses dentures at home  Skin Reviewed  Nails Reviewed       Diet Order:   Diet Order             Diet NPO time specified  Diet effective midnight           Diet Heart Room service appropriate? Yes; Fluid consistency: Thin  Diet effective now                   EDUCATION NEEDS:   No education needs have been identified at this time  Skin:  Skin Assessment: Skin Integrity Issues: Skin Integrity Issues:: Incisions Incisions: 1/31 abdomen  Last BM:  2/3  Height:   Ht Readings from Last 1 Encounters:  11/22/21 5\' 3"  (1.6 m)    Weight:   Wt Readings from Last 1 Encounters:  11/22/21 74.8 kg    BMI:  Body mass index is 29.21 kg/m.  Estimated Nutritional Needs:   Kcal:  1650-1850  Protein:  75-90g  Fluid:  1.8L/day   Clayton Bibles, MS, RD, LDN Inpatient Clinical Dietitian Contact information available via Amion

## 2021-11-22 NOTE — Consult Note (Signed)
Patient ID: Tiney Zipper MRN: [id number] DOB/AGE: 1933/09/18 88 y.o.  Admit date: 11/21/2021  Admission Diagnoses:  Principal Problem:   Closed left hip fracture, initial encounter [facility]) Active Problems:   Essential hypertension   Factor V Leiden ([place])   S/P cholecystectomy   History of pulmonary embolism   Right upper lobe pneumonia   HPI: 88 year old female with a history of hypertension, hyperlipidemia, PE 2/2 factor V Leiden (on chronic Eliquis, status post vena cava filter placement), GERD, recent Cholecystectomy on 11/16/2020 presents to the ED after a fall in her home.  Reports that she sometimes has spells of lightheadedness when she changes position.  She got up to use the bathroom when she felt "dizzy" and fell, striking her left side on the ground.  She has been having constant, unchanged pain in her left hip which is worse with any movement.  Was able to crawl to the phone to call for help and was transported to the ED via EMS.    She was found to have a left hip fracture and orthopedics was consulted. She ambulates without assistive device at baseline. She  lives at home alone, but she does have 5 adult children; 4 of which are local & should be able to help her after surgery. Last dose of Eliquis was morning of 11/21/2021.  Past Medical History: Past Medical History:  Diagnosis Date   Cancer ([place])    skin cancer   Dyspnea    Factor V Leiden ([place])    pulmonary embolism 2011   GERD (gastroesophageal reflux disease)    History of hiatal hernia    Hyperlipidemia    Hypertension    Pneumonia 2020   Primary localized osteoarthritis of left knee    Pulmonary embolism ([place]) 2011    Surgical History: Past Surgical History:  Procedure Laterality Date   ABDOMINAL HYSTERECTOMY  1973   APPENDECTOMY     age 72   CATARACT EXTRACTION Left 2018   COLONOSCOPY     KNEE ARTHROSCOPY Left 2004   RETINAL LASER PROCEDURE     VENA CAVA FILTER PLACEMENT  2011    Family  History: Family History  Problem Relation Age of Onset   Heart disease Mother        heart surgery   Parkinson's disease Father    Diabetes Sister    Diabetes Brother    Stroke Brother    Arrhythmia Daughter        afib   Breast cancer Neg Hx     Social History: Social History   Socioeconomic History   Marital status: Widowed    Spouse name: Not on file   Number of children: Not on file   Years of education: Not on file   Highest education level: Not on file  Occupational History   Not on file  Tobacco Use   Smoking status: Never   Smokeless tobacco: Never  Vaping Use   Vaping Use: Never used  Substance and Sexual Activity   Alcohol use: No   Drug use: No   Sexual activity: Not on file  Other Topics Concern   Not on file  Social History Narrative   Not on file   Social Determinants of Health   Financial Resource Strain: Not on file  Food Insecurity: Not on file  Transportation Needs: Not on file  Physical Activity: Not on file  Stress: Not on file  Social Connections: Not on file  Intimate Partner Violence: Not on  file    Allergies: Patient has no known allergies.  Medications: I have reviewed the patient's current medications.  Vital Signs: Patient Vitals for the past 24 hrs:  BP Temp Temp src Pulse Resp SpO2 Height Weight  11/22/21 0608 (!) 121/52 98.2 F (36.8 C) Oral 80 15 96 % -- --  11/22/21 0507 (!) 125/54 99.4 F (37.4 C) Oral 80 -- 96 % -- --  11/22/21 0311 (!) 137/59 98.5 F (36.9 C) Oral 90 17 -- 5\' 3"  (1.6 m) 74.8 kg  11/22/21 0202 (!) 137/59 98.5 F (36.9 C) Oral 90 17 97 % -- --  11/22/21 0141 -- -- -- -- 14 -- -- --  11/22/21 0100 (!) 149/58 -- -- 91 16 94 % -- --  11/22/21 0032 -- -- -- 91 -- 93 % -- --  11/22/21 0030 -- -- -- 92 -- -- -- --  11/22/21 0020 -- -- -- -- 20 -- -- --  11/22/21 0017 -- -- -- (!) 145 (!) 25 91 % -- --  11/22/21 0012 (!) 143/66 -- -- 97 20 90 % -- --  11/21/21 2300 (!) 134/43 -- -- 93 -- 98 % -- --   11/21/21 2238 -- -- -- -- -- -- -- 74.8 kg    Radiology: DG Chest 1 View  Result Date: 11/21/2021 CLINICAL DATA:  Fall, left hip pain. EXAM: CHEST  1 VIEW COMPARISON:  10/21/2020. FINDINGS: The heart is enlarged and mediastinal contours are within normal limits. Atherosclerotic calcification of the aorta is noted. A retrocardiac opacity is noted likely reflecting a moderate hiatal hernia. Mild airspace disease is present in the right upper lobe. No effusion or pneumothorax. No acute osseous abnormality. IMPRESSION: 1. Mild airspace disease in the right upper lobe, possible developing infiltrate. 2. Cardiomegaly. 3. Moderate hiatal hernia. Electronically Signed   By: Brett Fairy M.D.   On: 11/21/2021 23:39   US Abdomen Complete  Result Date: 11/02/2021 CLINICAL DATA:  Abdominal discomfort. EXAM: ABDOMEN ULTRASOUND COMPLETE COMPARISON:  CT abdomen and pelvis 10/26/2020 FINDINGS: Gallbladder: Gallstones are present measuring up to 9 mm. There is no gallbladder wall thickening or pericholecystic fluid. No sonographic Murphy sign noted by sonographer. Common bile duct: Diameter: 5 mm Liver: There are 2 solid-appearing isoechoic nodular densities. One is located in the inferolateral right lobe of the liver measuring 2.2 x 1.6 x 2.1 cm. The other is located near the gallbladder fossa measuring 3.2 x 2.6 x 2.3 cm. Portal vein is patent on color Doppler imaging with normal direction of blood flow towards the liver. IVC: No abnormality visualized. Pancreas: Visualized portion unremarkable. Spleen: Size and appearance within normal limits. Right Kidney: Length: 9.2 cm. Echogenicity within normal limits. No mass or hydronephrosis visualized. Left Kidney: Length: 10.2 cm. Echogenicity within normal limits. No mass or hydronephrosis visualized. Abdominal aorta: Mild aneurysmal dilatation measuring up to 3.2 cm, new from prior. Other findings: None. IMPRESSION: 1. There are 2 new isoechoic liver lesions,  indeterminate, measuring up to 3.2 cm. Metastatic disease is not excluded. This can be further evaluated with MRI or CT as clinically appropriate. 2. Cholelithiasis. 3. New 3.2 cm abdominal aortic aneurysm. Recommend follow-up every 3 years. Reference: J Am Coll Radiol 5956;[id number]. Electronically Signed   By: Ronney Asters M.D.   On: 11/02/2021 18:36   DG Hip Unilat W or Wo Pelvis 2-3 Views Left  Result Date: 11/21/2021 CLINICAL DATA:  Fall, left hip pain. EXAM: DG HIP (WITH OR WITHOUT PELVIS) 2-3V LEFT COMPARISON:  None. FINDINGS: There is a comminuted intertrochanteric fracture with displaced fracture fragments and superior subluxation of the distal fracture fragment. The fracture extends into the proximal femoral diaphysis. No dislocation is seen. Mild degenerative changes are present at the hips bilaterally. Vascular calcifications are noted in the soft tissues. IMPRESSION: Comminuted displaced intertrochanteric fracture extending into the proximal femoral diaphysis. Electronically Signed   By: Brett Fairy M.D.   On: 11/21/2021 23:36   US Abdomen Limited RUQ (LIVER/GB)  Result Date: 11/04/2021 CLINICAL DATA:  Abdominal pain. EXAM: ULTRASOUND ABDOMEN LIMITED RIGHT UPPER QUADRANT COMPARISON:  Ultrasound abdomen 11/02/2021. FINDINGS: Gallbladder: Multiple gallstones are identified measuring up to 9 mm. There is no gallbladder wall thickening. No sonographic Murphy sign noted by sonographer. Common bile duct: Diameter: 5 mm Liver: No focal lesion identified. Specifically, the 2 isoechoic liver lesions identified on recent comparison ultrasound are not seen on the current study. Mild increase in parenchymal echogenicity. Portal vein is patent on color Doppler imaging with normal direction of blood flow towards the liver. Other: None. IMPRESSION: 1. Cholelithiasis. No additional sonographic evidence for acute cholecystitis. 2. Mildly echogenic kidney may related to diffuse hepatocellular disease such as  fatty infiltration. 3. Isoechoic liver lesion seen on prior ultrasound are not definitely seen on the current study. Electronically Signed   By: Ronney Asters M.D.   On: 11/04/2021 17:01    Labs: Recent Labs    11/21/21 2249 11/22/21 0209  WBC 18.5* 14.2*  RBC 3.93 3.70*  HCT 32.7* 31.0*  PLT 250 241   Recent Labs    11/21/21 2249 11/22/21 0209  NA 130* 130*  K 3.7 4.1  CL 96* 95*  CO2 24 26  BUN 24* 26*  CREATININE 1.12* 1.17*  GLUCOSE 173* 169*  CALCIUM 9.1 8.8*   Recent Labs    11/21/21 2249  INR 1.2    Review of Systems: Review of Systems  Constitutional: Negative.   Respiratory:  Negative for shortness of breath.   Cardiovascular:  Negative for chest pain.  Musculoskeletal:  Positive for falls and joint pain.  Neurological:  Positive for dizziness. Negative for loss of consciousness.   Physical Exam: Body mass index is 29.21 kg/m.  Physical Exam Constitutional:      General: She is not in acute distress.    Appearance: Normal appearance.  HENT:     Head: Normocephalic and atraumatic.  Pulmonary:     Effort: Pulmonary effort is normal.  Abdominal:     General: There is no distension.     Palpations: Abdomen is soft.     Comments: Dermabond and ecchymosis over abdomen from recent gallbladder surgery.   Musculoskeletal:     Comments: Left leg shortened, abducted and externally rotated. TTP over left hip joint. No bruising or lesions over left hip. Plantarflexion/Dorsiflexion intact. Good distal sensation. Palpable DP pulses. Cap refill <2 secs.   Neurological:     Mental Status: She is alert and oriented to person, place, and time.  Psychiatric:        Mood and Affect: Mood normal.        Judgment: Judgment normal.     Assessment and Plan: Left comminuted intertrochanteric fracture, will require surgical intervention.   Bridging from Eliquis to Heparin  Plan is for Dr. Lyla Glassing to do surgery 11/23/2021. Please make patient NPO after  midnight  [facility] PA-C [facility]

## 2021-11-22 NOTE — H&P (View-Only) (Signed)
ORTHOPAEDIC CONSULTATION  REQUESTING PHYSICIAN: Oswald Hillock, MD  PCP:  Wenda Low, MD  Chief Complaint: Left hip injury  HPI: Sara Rodriguez is a 88 y.o. female with a past medical history of hypercoagulable state due to factor V Leiden, pulmonary embolism, hypertension, skin cancer, and cholecystectomy last week who got lightheaded at home last night and sustained a ground-level fall onto her left hip.  She had immediate left hip pain and inability to weight-bear.  She was brought to the emergency department at [facility], where x-rays revealed a comminuted, displaced left intertrochanteric femur fracture.  Her last dose of Eliquis was Sunday morning.  She was admitted by the hospitalist service for perioperative restratification and medical optimization.  Her Eliquis was held, and she was started on a heparin drip.  Orthopedic consultation was placed for management of her left hip fracture.  I was asked to take over care by Dr. Rolena Infante.  Her family is at the bedside.  She denies other injuries.  Past Medical History:  Diagnosis Date   Cancer ([place])    skin cancer   Dyspnea    Factor V Leiden ([place])    pulmonary embolism 2011   GERD (gastroesophageal reflux disease)    History of hiatal hernia    Hyperlipidemia    Hypertension    Pneumonia 2020   Primary localized osteoarthritis of left knee    Pulmonary embolism ([place]) 2011   Past Surgical History:  Procedure Laterality Date   ABDOMINAL HYSTERECTOMY  1973   APPENDECTOMY     age 10   CATARACT EXTRACTION Left 2018   COLONOSCOPY     KNEE ARTHROSCOPY Left 2004   RETINAL LASER PROCEDURE     VENA CAVA FILTER PLACEMENT  2011   Social History   Socioeconomic History   Marital status: Widowed    Spouse name: Not on file   Number of children: Not on file   Years of education: Not on file   Highest education level: Not on file  Occupational History   Not on file  Tobacco Use   Smoking status: Never    Smokeless tobacco: Never  Vaping Use   Vaping Use: Never used  Substance and Sexual Activity   Alcohol use: No   Drug use: No   Sexual activity: Not on file  Other Topics Concern   Not on file  Social History Narrative   Not on file   Social Determinants of Health   Financial Resource Strain: Not on file  Food Insecurity: Not on file  Transportation Needs: Not on file  Physical Activity: Not on file  Stress: Not on file  Social Connections: Not on file   Family History  Problem Relation Age of Onset   Heart disease Mother        heart surgery   Parkinson's disease Father    Diabetes Sister    Diabetes Brother    Stroke Brother    Arrhythmia Daughter        afib   Breast cancer Neg Hx    No Known Allergies Prior to Admission medications   Medication Sig Start Date End Date Taking? Authorizing Provider  acetaminophen (TYLENOL) 500 MG tablet Take 500-1,000 mg by mouth every 6 (six) hours as needed for mild pain or headache.   Yes [provider]  albuterol (VENTOLIN HFA) 108 (90 Base) MCG/ACT inhaler Inhale 2 puffs into the lungs every 4 (four) hours as needed for wheezing or shortness  of breath.   Yes [provider]  amLODipine (NORVASC) 5 MG tablet Take 1 tablet (5 mg total) by mouth daily. Patient taking differently: Take 5 mg by mouth 2 (two) times daily. 07/14/16 11/22/21 Yes Lorretta Harp, MD  apixaban (ELIQUIS) 5 MG TABS tablet Take 5 mg by mouth 2 (two) times daily.   Yes [provider]  atorvastatin (LIPITOR) 10 MG tablet Take 10 mg by mouth at bedtime. 01/22/16  Yes [provider]  ferrous sulfate 324 MG TBEC Take 324 mg by mouth daily with breakfast.   Yes [provider]  hydrochlorothiazide (HYDRODIURIL) 25 MG tablet Take 25 mg by mouth daily. 06/06/20  Yes [provider]  olmesartan (BENICAR) 40 MG tablet Take 40 mg by mouth daily.   Yes [provider]  Omega-3 Fatty Acids (FISH OIL) 1000 MG CAPS  Take 1,000 mg by mouth daily with breakfast.   Yes [provider]  omeprazole (PRILOSEC) 40 MG capsule Take 40 mg by mouth daily before breakfast.   Yes [provider]  ondansetron (ZOFRAN) 4 MG tablet Take 1 tablet (4 mg total) by mouth every 8 (eight) hours as needed for nausea or vomiting. 11/04/21  Yes Prince Rome, PA-C  polyethylene glycol powder (GLYCOLAX/MIRALAX) 17 GM/SCOOP powder Take 8.5 g by mouth daily as needed for mild constipation (to mixed into 6-8 ounces of water before drinking).   Yes [provider]  tetrahydrozoline-zinc (VISINE-AC) 0.05-0.25 % ophthalmic solution Place 2 drops into both eyes daily as needed (dry eyes).   Yes [provider]  traMADol (ULTRAM) 50 MG tablet Take 50 mg by mouth every 6 (six) hours as needed (for pain).   Yes [provider]  VITAMIN D3 SUPER STRENGTH 50 MCG (2000 UT) CAPS Take 2,000 Units by mouth daily.   Yes [provider]   DG Chest 1 View  Result Date: 11/21/2021 CLINICAL DATA:  Fall, left hip pain. EXAM: CHEST  1 VIEW COMPARISON:  10/21/2020. FINDINGS: The heart is enlarged and mediastinal contours are within normal limits. Atherosclerotic calcification of the aorta is noted. A retrocardiac opacity is noted likely reflecting a moderate hiatal hernia. Mild airspace disease is present in the right upper lobe. No effusion or pneumothorax. No acute osseous abnormality. IMPRESSION: 1. Mild airspace disease in the right upper lobe, possible developing infiltrate. 2. Cardiomegaly. 3. Moderate hiatal hernia. Electronically Signed   By: Brett Fairy M.D.   On: 11/21/2021 23:39   DG Knee Left Port  Result Date: 11/22/2021 CLINICAL DATA:  Proximal femur fracture EXAM: PORTABLE LEFT KNEE - 2 VIEW COMPARISON:  None. FINDINGS: No evidence of fracture or dislocation. No joint effusion. Moderate severe tricompartmental degenerative changes, most pronounced at the medial and patellofemoral  compartments. Vascular calcifications. IMPRESSION: No acute osseous abnormality of the left knee. Electronically Signed   By: Yetta Glassman M.D.   On: 11/22/2021 09:25   DG Hip Unilat W or Wo Pelvis 2-3 Views Left  Result Date: 11/21/2021 CLINICAL DATA:  Fall, left hip pain. EXAM: DG HIP (WITH OR WITHOUT PELVIS) 2-3V LEFT COMPARISON:  None. FINDINGS: There is a comminuted intertrochanteric fracture with displaced fracture fragments and superior subluxation of the distal fracture fragment. The fracture extends into the proximal femoral diaphysis. No dislocation is seen. Mild degenerative changes are present at the hips bilaterally. Vascular calcifications are noted in the soft tissues. IMPRESSION: Comminuted displaced intertrochanteric fracture extending into the proximal femoral diaphysis. Electronically Signed   By: Mickel Baas  Lovena Le M.D.   On: 11/21/2021 23:36    Positive ROS: All other systems have been reviewed and were otherwise negative with the exception of those mentioned in the HPI and as above.  Physical Exam: General: Alert, no acute distress Cardiovascular: No pedal edema Respiratory: No cyanosis, no use of accessory musculature GI: No organomegaly, abdomen is soft and non-tender Skin: No lesions in the area of chief complaint Neurologic: Sensation intact distally Psychiatric: Patient is competent for consent with normal mood and affect Lymphatic: No axillary or cervical lymphadenopathy  MUSCULOSKELETAL: Examination of the left hip reveals no skin wounds or lesions.  She is shortened and externally rotated.  Pain with attempted logrolling.  Positive motor function dorsiflexion, plantarflexion, and great toe extension.  Her foot is warm and well-perfused.  She reports intact sensation throughout.  Assessment: Displaced left intertrochanteric femur fracture. Factor V Leiden on chronic Coumadin. Recent cholecystectomy with decreased p.o. intake.  Plan: I discussed the findings with  the patient and her family.  She has an unstable, displaced left intertrochanteric femur fracture that requires surgical stabilization.  We discussed the risk, benefits, and alternatives to intramedullary fixation of the left femur.  Please see statement of risk.  She is at increased risk of bleeding and wound healing complications due to anticoagulant usage.  Her last dose of Eliquis was Sunday morning.  She is on a heparin drip.  Surgery will need to be tomorrow in order to allow Eliquis washout.  The heparin drip will need to be stopped 6 hours preoperatively.  Her surgery is planned for around 430 tomorrow afternoon, so the heparin drip will need to be stopped around 1030 tomorrow morning. Postoperatively, we will resume Eliquis the next day.  All questions were solicited and answered.  The risks, benefits, and alternatives were discussed with the patient. There are risks associated with the surgery including, but not limited to, problems with anesthesia (death), infection, differences in leg length/angulation/rotation, fracture of bones, loosening or failure of implants, malunion, nonunion, hematoma (blood accumulation) which may require surgical drainage, blood clots, pulmonary embolism, nerve injury (foot drop), and blood vessel injury. The patient understands these risks and elects to proceed.    Bertram Savin, MD [phone]    11/22/2021 1:57 PM

## 2021-11-22 NOTE — Assessment & Plan Note (Addendum)
Antihypertensive medications were held on admission her blood pressure started to trend up she will resume them as an outpatient

## 2021-11-22 NOTE — Progress Notes (Signed)
ANTICOAGULATION CONSULT NOTE - Initial Consult  Pharmacy Consult for Heparin Indication:  hx PE- bridge therapy with off apixaban  No Known Allergies  Patient Measurements: Height: 5\' 3"  (160 cm) Weight: 74.8 kg (164 lb 14.5 oz) IBW/kg (Calculated) : 52.4 Heparin Dosing Weight: 68 kg  Vital Signs: Temp: 98.4 F (36.9 C) (02/06 1139) Temp Source: Axillary (02/06 1139) BP: 149/52 (02/06 1139) Pulse Rate: 88 (02/06 1139)  Labs: Recent Labs    11/21/21 2249 11/22/21 0209 11/22/21 1133  HGB 10.1* 9.7*  --   HCT 32.7* 31.0*  --   PLT 250 241  --   APTT  --  26 48*  LABPROT 15.3*  --   --   INR 1.2  --   --   HEPARINUNFRC  --  >1.10*  --   CREATININE 1.12* 1.17*  --      Estimated Creatinine Clearance: 33.5 mL/min (A) (by C-G formula based on SCr of 1.17 mg/dL (H)).   Medical History: Past Medical History:  Diagnosis Date   Cancer ([place])    skin cancer   Dyspnea    Factor V Leiden ([place])    pulmonary embolism 2011   GERD (gastroesophageal reflux disease)    History of hiatal hernia    Hyperlipidemia    Hypertension    Pneumonia 2020   Primary localized osteoarthritis of left knee    Pulmonary embolism ([place]) 2011    Medications:  Infusions:   azithromycin 500 mg (11/22/21 0339)   cefTRIAXone (ROCEPHIN)  IV 2 g (11/22/21 0306)   heparin     lactated ringers 100 mL/hr at 11/22/21 1137    Assessment: 88 yo F with hx PE and Factor V Leiden on Eliquis PTA. Last dose Eliquis charted as 8/5 @ 0800. She presents with hip fracture.  Eliquis is being held in anticipation of surgery.  PMH also significant for recent surgical procedure- robotic cholecystectomy on 11/16/21. Pharmacy consulted to bridge with IV heparin. Baseline labs:  CBC- Hg low at 10.1 but stable since hospital discharge, pltc WNL.  Scr 1.12 with estimated CrCl ~15ml/min, INR 1.2, aptt/heparin level ordered.   Today 11/22/21 1:38 PM  aPTT 48 No bleeding or infusion related issues per RN CBC  stable  Goal of Therapy:  Heparin level 0.3-0.7 units/ml aPTT 66-102 seconds Monitor platelets by anticoagulation protocol: Yes   Plan:  Increase heparin infusion to 1100 units/hr Check aptt in 8hrs - Monitor using aptt until heparin level normalized/correlates with aptt Daily CBC & heparin level Hold heparin at 1030 on 2/7 for scheduled OR @ 1630.  Ulice Dash D PharmD 11/22/2021,1:37 PM

## 2021-11-22 NOTE — Progress Notes (Addendum)
ANTICOAGULATION CONSULT NOTE - follow up  Pharmacy Consult for Heparin Indication:  hx PE- bridge therapy with off apixaban  No Known Allergies  Patient Measurements: Height: 5\' 3"  (160 cm) Weight: 74.8 kg (164 lb 14.5 oz) IBW/kg (Calculated) : 52.4 Heparin Dosing Weight: 68 kg  Vital Signs: Temp: 99.2 F (37.3 C) (02/06 2037) Temp Source: Oral (02/06 2037) BP: 138/48 (02/06 2037) Pulse Rate: 80 (02/06 2037)  Labs: Recent Labs    11/21/21 2249 11/22/21 0209 11/22/21 1133 11/22/21 2248  HGB 10.1* 9.7*  --   --   HCT 32.7* 31.0*  --   --   PLT 250 241  --   --   APTT  --  26 48* 92*  LABPROT 15.3*  --   --   --   INR 1.2  --   --   --   HEPARINUNFRC  --  >1.10*  --   --   CREATININE 1.12* 1.17*  --   --      Estimated Creatinine Clearance: 33.5 mL/min (A) (by C-G formula based on SCr of 1.17 mg/dL (H)).   Medical History: Past Medical History:  Diagnosis Date   Cancer ([place])    skin cancer   Dyspnea    Factor V Leiden ([place])    pulmonary embolism 2011   GERD (gastroesophageal reflux disease)    History of hiatal hernia    Hyperlipidemia    Hypertension    Pneumonia 2020   Primary localized osteoarthritis of left knee    Pulmonary embolism ([place]) 2011    Medications:  Infusions:   azithromycin 500 mg (11/22/21 0339)   cefTRIAXone (ROCEPHIN)  IV 2 g (11/22/21 0306)   heparin 1,100 Units/hr (11/22/21 1407)   lactated ringers 100 mL/hr at 11/22/21 1137    Assessment: 88 yo F with hx PE and Factor V Leiden on Eliquis PTA. Last dose Eliquis charted as 8/5 @ 0800. She presents with hip fracture.  Eliquis is being held in anticipation of surgery.  PMH also significant for recent surgical procedure- robotic cholecystectomy on 11/16/21. Pharmacy consulted to bridge with IV heparin. Baseline labs:  CBC- Hg low at 10.1 but stable since hospital discharge, pltc WNL.  Scr 1.12 with estimated CrCl ~31ml/min, INR 1.2, aptt/heparin level ordered.   Today 11/22/21  11:59 PM  aPTT 92 therapeutic on 1100 units/hr No bleeding or infusion related issues noted  Goal of Therapy:  Heparin level 0.3-0.7 units/ml aPTT 66-102 seconds Monitor platelets by anticoagulation protocol: Yes   Plan:  Continue heparin infusion at 1100 units/hr Check aptt in 8hrs - Monitor using aptt until heparin level normalized/correlates with aptt Daily CBC & heparin level Hold heparin at 1030 on 2/7 for scheduled OR @ 1630.  Dolly Rias RPh 11/23/2021, 12:01 AM  Confirmatory aPTT level therapeutic at 79 seconds Hgb down 7.4, plts WNL No bleeding noted Hold heparin at 1030 on 2/7 for scheduled OR @ 1630.  Dolly Rias RPh 11/23/2021, 4:51 AM

## 2021-11-22 NOTE — Progress Notes (Signed)
Subjective: Patient admitted this morning, see detailed H&P by Dr Alcario Drought 88 year old female with a history of PE, factor V Leiden on Eliquis, hypertension, recent cholecystectomy 7 days ago.  Fell at home, found to have closed left hip fracture.  Also found to have right upper lobe pneumonia.  Patient started on heparin gtt.  for pulmonary embolism  Vitals:   11/22/21 1128 11/22/21 1139  BP: (!) 142/52 (!) 149/52  Pulse:  88  Resp:  17  Temp:  98.4 F (36.9 C)  SpO2:  96%      A/P  Closed left hip fracture Right upper lobe pneumonia History of pulmonary embolism Status postcholecystectomy Hypertension  Orthopedic surgery has seen the patient Plan for surgery in a.m. Started empirically on antibiotics for pneumonia Started on heparin GTT  for pulmonary embolism Blood pressure well controlled with amlodipine, irbesartan   [place]

## 2021-11-22 NOTE — H&P (Signed)
History and Physical    Patient: Sara Rodriguez OZH:[id number] DOB: 29-Oct-1932 DOA: 11/21/2021 DOS: the patient was seen and examined on 11/22/2021 PCP: Wenda Low, MD  Patient coming from: Home  Chief Complaint:  Chief Complaint  Patient presents with   Hip Pain    HPI: Sara Rodriguez is a 88 y.o. female with medical history significant of PE, factor v Leiden, on eliquis, HTN, and recent cholecystectomy x7 days ago.  Pt had been doing well from lap chole standpoint, had been discharged home.  Reports that she sometimes has spells of lightheadedness when she changes position.  She got up to use the bathroom when she felt "dizzy" and fell, striking her left side on the ground.  She denies head trauma as well as loss of consciousness.  She has been having constant, unchanged pain in her left hip which is worse with any movement.  Was able to crawl to the phone to call for help and was transported to the ED via EMS.  Pt denies fever, HAS had cough since discharge.  Also one episode of N/V on Sat.  Review of Systems: As mentioned in the history of present illness. All other systems reviewed and are negative. Past Medical History:  Diagnosis Date   Cancer [facility])    skin cancer   Dyspnea    Factor V Leiden ([place])    pulmonary embolism 2011   GERD (gastroesophageal reflux disease)    History of hiatal hernia    Hyperlipidemia    Hypertension    Pneumonia 2020   Primary localized osteoarthritis of left knee    Pulmonary embolism ([place]) 2011   Past Surgical History:  Procedure Laterality Date   ABDOMINAL HYSTERECTOMY  1973   APPENDECTOMY     age 38   CATARACT EXTRACTION Left 2018   COLONOSCOPY     KNEE ARTHROSCOPY Left 2004   RETINAL LASER PROCEDURE     VENA CAVA FILTER PLACEMENT  2011   Social History:  reports that she has never smoked. She has never used smokeless tobacco. She reports that she does not drink alcohol and does not use drugs.  No Known  Allergies  Family History  Problem Relation Age of Onset   Heart disease Mother        heart surgery   Parkinson's disease Father    Diabetes Sister    Diabetes Brother    Stroke Brother    Arrhythmia Daughter        afib   Breast cancer Neg Hx     Prior to Admission medications   Medication Sig Start Date End Date Taking? Authorizing Provider  acetaminophen (TYLENOL) 500 MG tablet Take 500-1,000 mg by mouth every 6 (six) hours as needed for mild pain or headache.   Yes [provider]  albuterol (VENTOLIN HFA) 108 (90 Base) MCG/ACT inhaler Inhale 2 puffs into the lungs every 4 (four) hours as needed for wheezing or shortness of breath.   Yes [provider]  amLODipine (NORVASC) 5 MG tablet Take 1 tablet (5 mg total) by mouth daily. Patient taking differently: Take 5 mg by mouth 2 (two) times daily. 07/14/16 11/22/21 Yes Lorretta Harp, MD  apixaban (ELIQUIS) 5 MG TABS tablet Take 5 mg by mouth 2 (two) times daily.   Yes [provider]  atorvastatin (LIPITOR) 10 MG tablet Take 10 mg by mouth at bedtime. 01/22/16  Yes [provider]  ferrous sulfate 324 MG TBEC Take 324 mg by  mouth daily with breakfast.   Yes [provider]  hydrochlorothiazide (HYDRODIURIL) 25 MG tablet Take 25 mg by mouth daily. 06/06/20  Yes [provider]  olmesartan (BENICAR) 40 MG tablet Take 40 mg by mouth daily.   Yes [provider]  Omega-3 Fatty Acids (FISH OIL) 1000 MG CAPS Take 1,000 mg by mouth daily with breakfast.   Yes [provider]  omeprazole (PRILOSEC) 40 MG capsule Take 40 mg by mouth daily before breakfast.   Yes [provider]  ondansetron (ZOFRAN) 4 MG tablet Take 1 tablet (4 mg total) by mouth every 8 (eight) hours as needed for nausea or vomiting. 11/04/21  Yes Prince Rome, PA-C  polyethylene glycol powder (GLYCOLAX/MIRALAX) 17 GM/SCOOP powder Take 8.5 g by mouth daily as needed for mild constipation (to  mixed into 6-8 ounces of water before drinking).   Yes [provider]  tetrahydrozoline-zinc (VISINE-AC) 0.05-0.25 % ophthalmic solution Place 2 drops into both eyes daily as needed (dry eyes).   Yes [provider]  traMADol (ULTRAM) 50 MG tablet Take 50 mg by mouth every 6 (six) hours as needed (for pain).   Yes [provider]  VITAMIN D3 SUPER STRENGTH 50 MCG (2000 UT) CAPS Take 2,000 Units by mouth daily.   Yes [provider]    Physical Exam: Vitals:   11/22/21 0017 11/22/21 0020 11/22/21 0030 11/22/21 0032  BP:      Pulse: (!) 145  92 91  Resp: (!) 25 20    SpO2: 91%   93%  Weight:       Constitutional: NAD, calm, comfortable Eyes: PERRL, lids and conjunctivae normal ENMT: Mucous membranes are moist. Posterior pharynx clear of any exudate or lesions.Normal dentition.  Neck: normal, supple, no masses, no thyromegaly Respiratory: clear to auscultation bilaterally, no wheezing, no crackles. Normal respiratory effort. No accessory muscle use.  Cardiovascular: Regular rate and rhythm, no murmurs / rubs / gallops. No extremity edema. 2+ pedal pulses. No carotid bruits.  Abdomen: no tenderness, no masses palpated. No hepatosplenomegaly. Bowel sounds positive.  Musculoskeletal: L hip deformity and TTP Skin: no rashes, lesions, ulcers. No induration Neurologic: CN 2-12 grossly intact. Sensation intact, DTR normal. Strength 5/5 in all 4.  Psychiatric: Normal judgment and insight. Alert and oriented x 3. Normal mood.    Data Reviewed:  WBC of 18k.  CXR actually suggests RUL PNA Hip X ray confirms the expected L hip fx.  Assessment and Plan: * Closed left hip fracture, initial encounter ([place])- (present on admission) Hip fx pathway Ortho to see in AM NPO in case they end up deciding to do surgery tomorrow. Regarding timing of surgery: 1) Pt is on eliquis though which is being held. 2) Using heparin gtt to bridge (see PE below) 3) Of note pt  also just had cholecystectomy x7 days ago. 4) Pt also may or may not have RUL PNA as demonstrated on CXR (see below), going to empirically cover (also see below). Pain control, including IV, per hip fx pathway  Right upper lobe pneumonia- (present on admission) RUL opacity on CXR new compared to Jan.  Pt does have WBC 18k, and did just have admission and surgery x6 days ago for cholecystectomy. No fever. WBC could just be stress demargination, but given CXR findings, recent hospital stay; really dont want to take risk of infection of instrumentation with hip fx repair: Will start pt on empiric rocephin + azithromycin for the moment. Check MRSA PCR nares.  History of pulmonary embolism- (present on admission) And factor V leiden Hold eliquis for hip fx repair. Will bridge with heparin  S/P cholecystectomy Doing well from cholecystectomy standpoint.  Essential hypertension- (present on admission) Cont amlodipine and irbesartan; hold HCTZ       Advance Care Planning:   Code Status: Full Code  Consults: Dr. Rolena Infante, ortho  Family Communication: Family at bedside  Severity of Illness: The appropriate patient status for this patient is INPATIENT. Inpatient status is judged to be reasonable and necessary in order to provide the required intensity of service to ensure the patient's safety. The patient's presenting symptoms, physical exam findings, and initial radiographic and laboratory data in the context of their chronic comorbidities is felt to place them at high risk for further clinical deterioration. Furthermore, it is not anticipated that the patient will be medically stable for discharge from the hospital within 2 midnights of admission.   * I certify that at the point of admission it is my clinical judgment that the patient will require inpatient hospital care spanning beyond 2 midnights from the point of admission due to high intensity of service, high risk for further  deterioration and high frequency of surveillance required.*  Author: Etta Quill., DO 11/22/2021 1:07 AM  For on call review [email or web address].

## 2021-11-22 NOTE — Assessment & Plan Note (Signed)
Doing well from cholecystectomy standpoint.

## 2021-11-22 NOTE — Consult Note (Signed)
ORTHOPAEDIC CONSULTATION  REQUESTING PHYSICIAN: Oswald Hillock, MD  PCP:  Wenda Low, MD  Chief Complaint: Left hip injury  HPI: Sara Rodriguez is a 88 y.o. female with a past medical history of hypercoagulable state due to factor V Leiden, pulmonary embolism, hypertension, skin cancer, and cholecystectomy last week who got lightheaded at home last night and sustained a ground-level fall onto her left hip.  She had immediate left hip pain and inability to weight-bear.  She was brought to the emergency department at [facility], where x-rays revealed a comminuted, displaced left intertrochanteric femur fracture.  Her last dose of Eliquis was Sunday morning.  She was admitted by the hospitalist service for perioperative restratification and medical optimization.  Her Eliquis was held, and she was started on a heparin drip.  Orthopedic consultation was placed for management of her left hip fracture.  I was asked to take over care by Dr. Rolena Infante.  Her family is at the bedside.  She denies other injuries.  Past Medical History:  Diagnosis Date   Cancer ([place])    skin cancer   Dyspnea    Factor V Leiden ([place])    pulmonary embolism 2011   GERD (gastroesophageal reflux disease)    History of hiatal hernia    Hyperlipidemia    Hypertension    Pneumonia 2020   Primary localized osteoarthritis of left knee    Pulmonary embolism ([place]) 2011   Past Surgical History:  Procedure Laterality Date   ABDOMINAL HYSTERECTOMY  1973   APPENDECTOMY     age 81   CATARACT EXTRACTION Left 2018   COLONOSCOPY     KNEE ARTHROSCOPY Left 2004   RETINAL LASER PROCEDURE     VENA CAVA FILTER PLACEMENT  2011   Social History   Socioeconomic History   Marital status: Widowed    Spouse name: Not on file   Number of children: Not on file   Years of education: Not on file   Highest education level: Not on file  Occupational History   Not on file  Tobacco Use   Smoking status: Never    Smokeless tobacco: Never  Vaping Use   Vaping Use: Never used  Substance and Sexual Activity   Alcohol use: No   Drug use: No   Sexual activity: Not on file  Other Topics Concern   Not on file  Social History Narrative   Not on file   Social Determinants of Health   Financial Resource Strain: Not on file  Food Insecurity: Not on file  Transportation Needs: Not on file  Physical Activity: Not on file  Stress: Not on file  Social Connections: Not on file   Family History  Problem Relation Age of Onset   Heart disease Mother        heart surgery   Parkinson's disease Father    Diabetes Sister    Diabetes Brother    Stroke Brother    Arrhythmia Daughter        afib   Breast cancer Neg Hx    No Known Allergies Prior to Admission medications   Medication Sig Start Date End Date Taking? Authorizing Provider  acetaminophen (TYLENOL) 500 MG tablet Take 500-1,000 mg by mouth every 6 (six) hours as needed for mild pain or headache.   Yes [provider]  albuterol (VENTOLIN HFA) 108 (90 Base) MCG/ACT inhaler Inhale 2 puffs into the lungs every 4 (four) hours as needed for wheezing or shortness  of breath.   Yes [provider]  amLODipine (NORVASC) 5 MG tablet Take 1 tablet (5 mg total) by mouth daily. Patient taking differently: Take 5 mg by mouth 2 (two) times daily. 07/14/16 11/22/21 Yes Lorretta Harp, MD  apixaban (ELIQUIS) 5 MG TABS tablet Take 5 mg by mouth 2 (two) times daily.   Yes [provider]  atorvastatin (LIPITOR) 10 MG tablet Take 10 mg by mouth at bedtime. 01/22/16  Yes [provider]  ferrous sulfate 324 MG TBEC Take 324 mg by mouth daily with breakfast.   Yes [provider]  hydrochlorothiazide (HYDRODIURIL) 25 MG tablet Take 25 mg by mouth daily. 06/06/20  Yes [provider]  olmesartan (BENICAR) 40 MG tablet Take 40 mg by mouth daily.   Yes [provider]  Omega-3 Fatty Acids (FISH OIL) 1000 MG CAPS  Take 1,000 mg by mouth daily with breakfast.   Yes [provider]  omeprazole (PRILOSEC) 40 MG capsule Take 40 mg by mouth daily before breakfast.   Yes [provider]  ondansetron (ZOFRAN) 4 MG tablet Take 1 tablet (4 mg total) by mouth every 8 (eight) hours as needed for nausea or vomiting. 11/04/21  Yes Prince Rome, PA-C  polyethylene glycol powder (GLYCOLAX/MIRALAX) 17 GM/SCOOP powder Take 8.5 g by mouth daily as needed for mild constipation (to mixed into 6-8 ounces of water before drinking).   Yes [provider]  tetrahydrozoline-zinc (VISINE-AC) 0.05-0.25 % ophthalmic solution Place 2 drops into both eyes daily as needed (dry eyes).   Yes [provider]  traMADol (ULTRAM) 50 MG tablet Take 50 mg by mouth every 6 (six) hours as needed (for pain).   Yes [provider]  VITAMIN D3 SUPER STRENGTH 50 MCG (2000 UT) CAPS Take 2,000 Units by mouth daily.   Yes [provider]   DG Chest 1 View  Result Date: 11/21/2021 CLINICAL DATA:  Fall, left hip pain. EXAM: CHEST  1 VIEW COMPARISON:  10/21/2020. FINDINGS: The heart is enlarged and mediastinal contours are within normal limits. Atherosclerotic calcification of the aorta is noted. A retrocardiac opacity is noted likely reflecting a moderate hiatal hernia. Mild airspace disease is present in the right upper lobe. No effusion or pneumothorax. No acute osseous abnormality. IMPRESSION: 1. Mild airspace disease in the right upper lobe, possible developing infiltrate. 2. Cardiomegaly. 3. Moderate hiatal hernia. Electronically Signed   By: Brett Fairy M.D.   On: 11/21/2021 23:39   DG Knee Left Port  Result Date: 11/22/2021 CLINICAL DATA:  Proximal femur fracture EXAM: PORTABLE LEFT KNEE - 2 VIEW COMPARISON:  None. FINDINGS: No evidence of fracture or dislocation. No joint effusion. Moderate severe tricompartmental degenerative changes, most pronounced at the medial and patellofemoral  compartments. Vascular calcifications. IMPRESSION: No acute osseous abnormality of the left knee. Electronically Signed   By: Yetta Glassman M.D.   On: 11/22/2021 09:25   DG Hip Unilat W or Wo Pelvis 2-3 Views Left  Result Date: 11/21/2021 CLINICAL DATA:  Fall, left hip pain. EXAM: DG HIP (WITH OR WITHOUT PELVIS) 2-3V LEFT COMPARISON:  None. FINDINGS: There is a comminuted intertrochanteric fracture with displaced fracture fragments and superior subluxation of the distal fracture fragment. The fracture extends into the proximal femoral diaphysis. No dislocation is seen. Mild degenerative changes are present at the hips bilaterally. Vascular calcifications are noted in the soft tissues. IMPRESSION: Comminuted displaced intertrochanteric fracture extending into the proximal femoral diaphysis. Electronically Signed   By: Mickel Baas  Lovena Le M.D.   On: 11/21/2021 23:36    Positive ROS: All other systems have been reviewed and were otherwise negative with the exception of those mentioned in the HPI and as above.  Physical Exam: General: Alert, no acute distress Cardiovascular: No pedal edema Respiratory: No cyanosis, no use of accessory musculature GI: No organomegaly, abdomen is soft and non-tender Skin: No lesions in the area of chief complaint Neurologic: Sensation intact distally Psychiatric: Patient is competent for consent with normal mood and affect Lymphatic: No axillary or cervical lymphadenopathy  MUSCULOSKELETAL: Examination of the left hip reveals no skin wounds or lesions.  She is shortened and externally rotated.  Pain with attempted logrolling.  Positive motor function dorsiflexion, plantarflexion, and great toe extension.  Her foot is warm and well-perfused.  She reports intact sensation throughout.  Assessment: Displaced left intertrochanteric femur fracture. Factor V Leiden on chronic Coumadin. Recent cholecystectomy with decreased p.o. intake.  Plan: I discussed the findings with  the patient and her family.  She has an unstable, displaced left intertrochanteric femur fracture that requires surgical stabilization.  We discussed the risk, benefits, and alternatives to intramedullary fixation of the left femur.  Please see statement of risk.  She is at increased risk of bleeding and wound healing complications due to anticoagulant usage.  Her last dose of Eliquis was Sunday morning.  She is on a heparin drip.  Surgery will need to be tomorrow in order to allow Eliquis washout.  The heparin drip will need to be stopped 6 hours preoperatively.  Her surgery is planned for around 430 tomorrow afternoon, so the heparin drip will need to be stopped around 1030 tomorrow morning. Postoperatively, we will resume Eliquis the next day.  All questions were solicited and answered.  The risks, benefits, and alternatives were discussed with the patient. There are risks associated with the surgery including, but not limited to, problems with anesthesia (death), infection, differences in leg length/angulation/rotation, fracture of bones, loosening or failure of implants, malunion, nonunion, hematoma (blood accumulation) which may require surgical drainage, blood clots, pulmonary embolism, nerve injury (foot drop), and blood vessel injury. The patient understands these risks and elects to proceed.    Bertram Savin, MD [phone]    11/22/2021 1:57 PM

## 2021-11-23 ENCOUNTER — Inpatient Hospital Stay (HOSPITAL_COMMUNITY): Payer: Medicare Other

## 2021-11-23 ENCOUNTER — Other Ambulatory Visit: Payer: Self-pay

## 2021-11-23 ENCOUNTER — Inpatient Hospital Stay (HOSPITAL_COMMUNITY): Payer: Medicare Other | Admitting: Certified Registered Nurse Anesthetist

## 2021-11-23 ENCOUNTER — Encounter (HOSPITAL_COMMUNITY): Payer: Self-pay | Admitting: Internal Medicine

## 2021-11-23 ENCOUNTER — Encounter (HOSPITAL_COMMUNITY)
Admission: EM | Disposition: A | Discharge status: Skilled Nursing Facility | Payer: Self-pay | Source: Home / Self Care | Attending: Family Medicine

## 2021-11-23 DIAGNOSIS — S72002A Fracture of unspecified part of neck of left femur, initial encounter for closed fracture: Secondary | ICD-10-CM | POA: Diagnosis not present

## 2021-11-23 DIAGNOSIS — I10 Essential (primary) hypertension: Secondary | ICD-10-CM | POA: Diagnosis not present

## 2021-11-23 DIAGNOSIS — D6851 Activated protein C resistance: Secondary | ICD-10-CM | POA: Diagnosis not present

## 2021-11-23 DIAGNOSIS — Z86711 Personal history of pulmonary embolism: Secondary | ICD-10-CM | POA: Diagnosis not present

## 2021-11-23 HISTORY — PX: FEMUR IM NAIL: SHX1597

## 2021-11-23 LAB — HEPARIN LEVEL (UNFRACTIONATED): Heparin Unfractionated: >1.1 IU/mL — ABNORMAL HIGH (ref 0.30–0.70)

## 2021-11-23 LAB — COMPREHENSIVE METABOLIC PANEL
ALT: 12 U/L (ref 0–44)
AST: 19 U/L (ref 15–41)
Albumin: 2.4 g/dL — ABNORMAL LOW (ref 3.5–5.0)
Alkaline Phosphatase: 49 U/L (ref 38–126)
Anion gap: 7 (ref 5–15)
BUN: 27 mg/dL — ABNORMAL HIGH (ref 8–23)
CO2: 27 mmol/L (ref 22–32)
Calcium: 8.2 mg/dL — ABNORMAL LOW (ref 8.9–10.3)
Chloride: 99 mmol/L (ref 98–111)
Creatinine, Ser: 1.19 mg/dL — ABNORMAL HIGH (ref 0.44–1.00)
GFR, Estimated: 45 mL/min — ABNORMAL LOW (ref >60)
Glucose, Bld: 137 mg/dL — ABNORMAL HIGH (ref 70–99)
Potassium: 4.2 mmol/L (ref 3.5–5.1)
Sodium: 133 mmol/L — ABNORMAL LOW (ref 135–145)
Total Bilirubin: 0.2 mg/dL — ABNORMAL LOW (ref 0.3–1.2)
Total Protein: 5.3 g/dL — ABNORMAL LOW (ref 6.5–8.1)

## 2021-11-23 LAB — CBC
HCT: 24.3 % — ABNORMAL LOW (ref 36.0–46.0)
Hemoglobin: 7.4 g/dL — ABNORMAL LOW (ref 12.0–15.0)
MCH: 25.8 pg — ABNORMAL LOW (ref 26.0–34.0)
MCHC: 30.5 g/dL (ref 30.0–36.0)
MCV: 84.7 fL (ref 80.0–100.0)
Platelets: 202 10*3/uL (ref 150–400)
RBC: 2.87 MIL/uL — ABNORMAL LOW (ref 3.87–5.11)
RDW: 17.2 % — ABNORMAL HIGH (ref 11.5–15.5)
WBC: 18.2 10*3/uL — ABNORMAL HIGH (ref 4.0–10.5)
nRBC: 0 % (ref 0.0–0.2)

## 2021-11-23 LAB — PREPARE RBC (CROSSMATCH)

## 2021-11-23 LAB — APTT: aPTT: 79 seconds — ABNORMAL HIGH (ref 24–36)

## 2021-11-23 SURGERY — INSERTION, INTRAMEDULLARY ROD, FEMUR
Anesthesia: General | Laterality: Left

## 2021-11-23 MED ORDER — LIDOCAINE HCL (CARDIAC) PF 100 MG/5ML IV SOSY
PREFILLED_SYRINGE | INTRAVENOUS | Status: DC | PRN
  Administered 2021-11-23: 60 mg via INTRATRACHEAL

## 2021-11-23 MED ORDER — METHOCARBAMOL 500 MG IVPB - SIMPLE MED
500.0000 mg | Freq: Four times a day (QID) | INTRAVENOUS | Status: DC | PRN
Start: 2021-11-23 — End: 2021-11-25
  Filled 2021-11-23: qty 50

## 2021-11-23 MED ORDER — POVIDONE-IODINE 10 % EX SWAB
2.0000 "application " | Freq: Once | CUTANEOUS | Status: AC
  Administered 2021-11-23: 2 via TOPICAL

## 2021-11-23 MED ORDER — ONDANSETRON HCL 4 MG/2ML IJ SOLN
INTRAMUSCULAR | Status: AC
  Filled 2021-11-23: qty 2

## 2021-11-23 MED ORDER — PROPOFOL 10 MG/ML IV BOLUS
INTRAVENOUS | Status: DC | PRN
  Administered 2021-11-23: 90 mg via INTRAVENOUS

## 2021-11-23 MED ORDER — HYDROMORPHONE HCL 1 MG/ML IJ SOLN
INTRAMUSCULAR | Status: AC
  Filled 2021-11-23: qty 1

## 2021-11-23 MED ORDER — PHENYLEPHRINE HCL-NACL 20-0.9 MG/250ML-% IV SOLN
INTRAVENOUS | Status: DC | PRN
  Administered 2021-11-23: 20 ug/min via INTRAVENOUS

## 2021-11-23 MED ORDER — ONDANSETRON HCL 4 MG PO TABS
4.0000 mg | ORAL_TABLET | Freq: Four times a day (QID) | ORAL | Status: DC | PRN
  Filled 2021-11-23: qty 1

## 2021-11-23 MED ORDER — FENTANYL CITRATE PF 50 MCG/ML IJ SOSY
25.0000 ug | PREFILLED_SYRINGE | INTRAMUSCULAR | Status: DC | PRN
  Administered 2021-11-23: 50 ug via INTRAVENOUS

## 2021-11-23 MED ORDER — SODIUM CHLORIDE 0.9 % IR SOLN
Status: DC | PRN
  Administered 2021-11-23: 1000 mL

## 2021-11-23 MED ORDER — PHENOL 1.4 % MT LIQD: 1.0000 | OROMUCOSAL | Status: DC | PRN

## 2021-11-23 MED ORDER — PROPOFOL 10 MG/ML IV BOLUS
INTRAVENOUS | Status: AC
  Filled 2021-11-23: qty 20

## 2021-11-23 MED ORDER — CHLORHEXIDINE GLUCONATE 0.12 % MT SOLN
15.0000 mL | Freq: Once | OROMUCOSAL | Status: AC
  Administered 2021-11-23: 15 mL via OROMUCOSAL

## 2021-11-23 MED ORDER — ACETAMINOPHEN 10 MG/ML IV SOLN
INTRAVENOUS | Status: DC | PRN
  Administered 2021-11-23: 1000 mg via INTRAVENOUS

## 2021-11-23 MED ORDER — PHENYLEPHRINE HCL (PRESSORS) 10 MG/ML IV SOLN
INTRAVENOUS | Status: AC
  Filled 2021-11-23: qty 1

## 2021-11-23 MED ORDER — SODIUM CHLORIDE 0.9 % IV SOLN: INTRAVENOUS | Status: DC

## 2021-11-23 MED ORDER — ISOPROPYL ALCOHOL 70 % SOLN
Status: DC | PRN
  Administered 2021-11-23: 1 via TOPICAL

## 2021-11-23 MED ORDER — FENTANYL CITRATE (PF) 100 MCG/2ML IJ SOLN
INTRAMUSCULAR | Status: DC | PRN
  Administered 2021-11-23: 25 ug via INTRAVENOUS
  Administered 2021-11-23: 50 ug via INTRAVENOUS
  Administered 2021-11-23: 25 ug via INTRAVENOUS

## 2021-11-23 MED ORDER — FENTANYL CITRATE PF 50 MCG/ML IJ SOSY
PREFILLED_SYRINGE | INTRAMUSCULAR | Status: AC
  Administered 2021-11-23: 50 ug via INTRAVENOUS
  Filled 2021-11-23: qty 1

## 2021-11-23 MED ORDER — LIDOCAINE HCL (PF) 2 % IJ SOLN
INTRAMUSCULAR | Status: AC
  Filled 2021-11-23: qty 5

## 2021-11-23 MED ORDER — ACETAMINOPHEN 10 MG/ML IV SOLN
INTRAVENOUS | Status: AC
  Filled 2021-11-23: qty 100

## 2021-11-23 MED ORDER — MENTHOL 3 MG MT LOZG: 1.0000 | LOZENGE | OROMUCOSAL | Status: DC | PRN

## 2021-11-23 MED ORDER — SUGAMMADEX SODIUM 200 MG/2ML IV SOLN
INTRAVENOUS | Status: DC | PRN
  Administered 2021-11-23: 200 mg via INTRAVENOUS

## 2021-11-23 MED ORDER — CHLORHEXIDINE GLUCONATE 4 % EX LIQD
60.0000 mL | Freq: Once | CUTANEOUS | Status: AC
  Administered 2021-11-23: 4 via TOPICAL
  Filled 2021-11-23: qty 15

## 2021-11-23 MED ORDER — DEXAMETHASONE SODIUM PHOSPHATE 10 MG/ML IJ SOLN
INTRAMUSCULAR | Status: AC
  Filled 2021-11-23: qty 1

## 2021-11-23 MED ORDER — STERILE WATER FOR IRRIGATION IR SOLN
Status: DC | PRN
  Administered 2021-11-23: 2000 mL

## 2021-11-23 MED ORDER — ONDANSETRON HCL 4 MG/2ML IJ SOLN: 4.0000 mg | Freq: Four times a day (QID) | INTRAMUSCULAR | Status: DC | PRN

## 2021-11-23 MED ORDER — METOCLOPRAMIDE HCL 5 MG PO TABS
5.0000 mg | ORAL_TABLET | Freq: Three times a day (TID) | ORAL | Status: DC | PRN
  Filled 2021-11-23: qty 2

## 2021-11-23 MED ORDER — APIXABAN 2.5 MG PO TABS
2.5000 mg | ORAL_TABLET | Freq: Two times a day (BID) | ORAL | Status: DC
  Administered 2021-11-24 – 2021-11-25 (×3): 2.5 mg via ORAL
  Filled 2021-11-23 (×3): qty 1

## 2021-11-23 MED ORDER — DOCUSATE SODIUM 100 MG PO CAPS
100.0000 mg | ORAL_CAPSULE | Freq: Two times a day (BID) | ORAL | Status: DC
  Administered 2021-11-23 – 2021-11-25 (×4): 100 mg via ORAL
  Filled 2021-11-23 (×4): qty 1

## 2021-11-23 MED ORDER — METHOCARBAMOL 500 MG PO TABS
500.0000 mg | ORAL_TABLET | Freq: Four times a day (QID) | ORAL | Status: DC | PRN
  Administered 2021-11-24 – 2021-11-25 (×2): 500 mg via ORAL
  Filled 2021-11-23 (×2): qty 1

## 2021-11-23 MED ORDER — PROMETHAZINE HCL 25 MG/ML IJ SOLN: 6.2500 mg | INTRAMUSCULAR | Status: DC | PRN

## 2021-11-23 MED ORDER — METOCLOPRAMIDE HCL 5 MG/ML IJ SOLN: 5.0000 mg | Freq: Three times a day (TID) | INTRAMUSCULAR | Status: DC | PRN

## 2021-11-23 MED ORDER — SENNA 8.6 MG PO TABS
1.0000 | ORAL_TABLET | Freq: Two times a day (BID) | ORAL | Status: DC
  Administered 2021-11-23 – 2021-11-25 (×4): 8.6 mg via ORAL
  Filled 2021-11-23 (×4): qty 1

## 2021-11-23 MED ORDER — DEXAMETHASONE SODIUM PHOSPHATE 10 MG/ML IJ SOLN
INTRAMUSCULAR | Status: DC | PRN
  Administered 2021-11-23: 8 mg via INTRAVENOUS

## 2021-11-23 MED ORDER — ROCURONIUM BROMIDE 100 MG/10ML IV SOLN
INTRAVENOUS | Status: DC | PRN
  Administered 2021-11-23: 60 mg via INTRAVENOUS

## 2021-11-23 MED ORDER — CEFAZOLIN SODIUM-DEXTROSE 2-4 GM/100ML-% IV SOLN
2.0000 g | INTRAVENOUS | Status: AC
  Administered 2021-11-23: 2 g via INTRAVENOUS
  Filled 2021-11-23: qty 100

## 2021-11-23 MED ORDER — CEFAZOLIN SODIUM-DEXTROSE 2-4 GM/100ML-% IV SOLN
2.0000 g | Freq: Four times a day (QID) | INTRAVENOUS | Status: AC
  Administered 2021-11-23 – 2021-11-24 (×2): 2 g via INTRAVENOUS
  Filled 2021-11-23: qty 100

## 2021-11-23 MED ORDER — SODIUM CHLORIDE 0.9% IV SOLUTION: Freq: Once | INTRAVENOUS | Status: DC

## 2021-11-23 MED ORDER — FENTANYL CITRATE PF 50 MCG/ML IJ SOSY
PREFILLED_SYRINGE | INTRAMUSCULAR | Status: AC
  Filled 2021-11-23: qty 1

## 2021-11-23 MED ORDER — LACTATED RINGERS IV SOLN: INTRAVENOUS | Status: DC

## 2021-11-23 MED ORDER — TRANEXAMIC ACID-NACL 1000-0.7 MG/100ML-% IV SOLN
1000.0000 mg | INTRAVENOUS | Status: AC
  Administered 2021-11-23: 1000 mg via INTRAVENOUS
  Filled 2021-11-23: qty 100

## 2021-11-23 MED ORDER — FENTANYL CITRATE (PF) 100 MCG/2ML IJ SOLN
INTRAMUSCULAR | Status: AC
  Filled 2021-11-23: qty 2

## 2021-11-23 MED ORDER — ROCURONIUM BROMIDE 10 MG/ML (PF) SYRINGE
PREFILLED_SYRINGE | INTRAVENOUS | Status: AC
  Filled 2021-11-23: qty 10

## 2021-11-23 MED ORDER — ONDANSETRON HCL 4 MG/2ML IJ SOLN
INTRAMUSCULAR | Status: DC | PRN
  Administered 2021-11-23: 4 mg via INTRAVENOUS

## 2021-11-23 SURGICAL SUPPLY — 53 items
ADH SKN CLS APL DERMABOND .7 (GAUZE/BANDAGES/DRESSINGS) ×2
APL PRP STRL LF DISP 70% ISPRP (MISCELLANEOUS) ×1
BAG COUNTER SPONGE SURGICOUNT (BAG) IMPLANT
BAG SPEC THK2 15X12 ZIP CLS (MISCELLANEOUS)
BAG SPNG CNTER NS LX DISP (BAG)
BAG ZIPLOCK 12X15 (MISCELLANEOUS) IMPLANT
BIT DRILL CANN LG 4.3MM (BIT) IMPLANT
CHLORAPREP W/TINT 26 (MISCELLANEOUS) ×2 IMPLANT
COVER PERINEAL POST (MISCELLANEOUS) ×2 IMPLANT
COVER SURGICAL LIGHT HANDLE (MISCELLANEOUS) ×2 IMPLANT
DERMABOND ADVANCED (GAUZE/BANDAGES/DRESSINGS) ×2
DERMABOND ADVANCED .7 DNX12 (GAUZE/BANDAGES/DRESSINGS) ×1 IMPLANT
DRAPE C-ARM 42X120 X-RAY (DRAPES) ×2 IMPLANT
DRAPE C-ARMOR (DRAPES) ×2 IMPLANT
DRAPE SHEET LG 3/4 BI-LAMINATE (DRAPES) ×2 IMPLANT
DRAPE STERI IOBAN 125X83 (DRAPES) ×2 IMPLANT
DRAPE U-SHAPE 47X51 STRL (DRAPES) ×4 IMPLANT
DRILL BIT CANN LG 4.3MM (BIT) ×2
DRSG AQUACEL AG 3.5X4 (GAUZE/BANDAGES/DRESSINGS) ×1 IMPLANT
DRSG AQUACEL AG ADV 3.5X 6 (GAUZE/BANDAGES/DRESSINGS) ×2 IMPLANT
DRSG AQUACEL AG ADV 3.5X10 (GAUZE/BANDAGES/DRESSINGS) ×2 IMPLANT
FACESHIELD WRAPAROUND (MASK) ×6 IMPLANT
FACESHIELD WRAPAROUND OR TEAM (MASK) ×3 IMPLANT
GAUZE SPONGE 4X4 12PLY STRL (GAUZE/BANDAGES/DRESSINGS) ×2 IMPLANT
GLOVE SRG 8 PF TXTR STRL LF DI (GLOVE) ×1 IMPLANT
GLOVE SURG ENC MOIS LTX SZ8.5 (GLOVE) ×4 IMPLANT
GLOVE SURG ENC TEXT LTX SZ8 (GLOVE) ×6 IMPLANT
GLOVE SURG UNDER LTX SZ7.5 (GLOVE) ×2 IMPLANT
GLOVE SURG UNDER POLY LF SZ8 (GLOVE) ×2
GLOVE SURG UNDER POLY LF SZ8.5 (GLOVE) ×2 IMPLANT
GOWN SPEC L3 XXLG W/TWL (GOWN DISPOSABLE) ×2 IMPLANT
GOWN STRL REUS W/TWL XL LVL3 (GOWN DISPOSABLE) ×2 IMPLANT
GUIDEPIN VERSANAIL DSP 3.2X444 (ORTHOPEDIC DISPOSABLE SUPPLIES) ×2 IMPLANT
GUIDEWIRE 3.0X100MM BALL TIP (WIRE) ×1 IMPLANT
HFN 125 DEG 9MM X 180MM (Nail) ×1 IMPLANT
HIP FRA NAIL LAG SCREW 10.5X90 (Orthopedic Implant) ×2 IMPLANT
JET LAVAGE IRRISEPT WOUND (IRRIGATION / IRRIGATOR)
KIT BASIN OR (CUSTOM PROCEDURE TRAY) ×2 IMPLANT
KIT TURNOVER KIT A (KITS) IMPLANT
LAVAGE JET IRRISEPT WOUND (IRRIGATION / IRRIGATOR) IMPLANT
MANIFOLD NEPTUNE II (INSTRUMENTS) ×2 IMPLANT
MARKER SKIN DUAL TIP RULER LAB (MISCELLANEOUS) ×2 IMPLANT
PACK TOTAL JOINT (CUSTOM PROCEDURE TRAY) ×2 IMPLANT
SCREW BONE CORTICAL 5.0X36 (Screw) ×1 IMPLANT
SCREW LAG HIP FRA NAIL 10.5X90 (Orthopedic Implant) IMPLANT
SUT MNCRL AB 3-0 PS2 18 (SUTURE) IMPLANT
SUT MON AB 2-0 CT1 36 (SUTURE) ×2 IMPLANT
SUT VIC AB 1 CT1 27 (SUTURE) ×2
SUT VIC AB 1 CT1 27XBRD ANTBC (SUTURE) ×1 IMPLANT
TOWEL OR 17X26 10 PK STRL BLUE (TOWEL DISPOSABLE) ×2 IMPLANT
TOWEL OR NON WOVEN STRL DISP B (DISPOSABLE) ×2 IMPLANT
TRAY FOLEY MTR SLVR 14FR STAT (SET/KITS/TRAYS/PACK) ×2 IMPLANT
YANKAUER SUCT BULB TIP NO VENT (SUCTIONS) ×2 IMPLANT

## 2021-11-23 NOTE — Anesthesia Procedure Notes (Signed)
Date/Time: 11/23/2021 6:18 PM Performed by: Cynda Familia, CRNA Oxygen Delivery Method: Simple face mask Placement Confirmation: positive ETCO2 and breath sounds checked- equal and bilateral Dental Injury: Teeth and Oropharynx as per pre-operative assessment

## 2021-11-23 NOTE — Interval H&P Note (Signed)
History and Physical Interval Note:  11/23/2021 4:22 PM  Sara Rodriguez  has presented today for surgery, with the diagnosis of LEFT intertrochanteric fracture.  The various methods of treatment have been discussed with the patient and family. After consideration of risks, benefits and other options for treatment, the patient has consented to  Procedure(s): INTRAMEDULLARY (IM) NAIL FEMORAL (Left) as a surgical intervention.  The patient's history has been reviewed, patient examined, no change in status, stable for surgery.  I have reviewed the patient's chart and labs.  Questions were answered to the patient's satisfaction.     Hilton Cork Citlalic Norlander

## 2021-11-23 NOTE — Discharge Instructions (Signed)
 Dr. Courtez Twaddle [facility] K[facility] [address] , [place] [address] [phone]   POSTOPERATIVE DIRECTIONS    Hip Rehabilitation, Guidelines Following Surgery   WEIGHT BEARING Weight bearing as tolerated with assist device (walker, cane, etc) as directed, use it as long as suggested by your surgeon or therapist, typically at least 4-6 weeks.   HOME CARE INSTRUCTIONS  Remove items at home which could result in a fall. This includes throw rugs or furniture in walking pathways.  Continue medications as instructed at time of discharge.  You may have some home medications which will be placed on hold until you complete the course of blood thinner medication.  4 days after discharge, you may start showering. No tub baths or soaking your incisions. Do not put on socks or shoes without following the instructions of your caregivers.   Sit on chairs with arms. Use the chair arms to help push yourself up when arising.  Arrange for the use of a toilet seat elevator so you are not sitting low.   Walk with walker as instructed.  You may resume a sexual relationship in one month or when given the OK by your caregiver.  Use walker as long as suggested by your caregivers.  Avoid periods of inactivity such as sitting longer than an hour when not asleep. This helps prevent blood clots.  You may return to work once you are cleared by your surgeon.  Do not drive a car for 6 weeks or until released by your surgeon.  Do not drive while taking narcotics.  Wear elastic stockings for two weeks following surgery during the day but you may remove then at night.  Make sure you keep all of your appointments after your operation with all of your doctors and caregivers. You should call the office at the above phone number and make an appointment for approximately two weeks after the date of your surgery. Please pick up a stool softener and laxative  for home use as long as you are requiring pain medications.  ICE to the affected hip every three hours for 30 minutes at a time and then as needed for pain and swelling. Continue to use ice on the hip for pain and swelling from surgery. You may notice swelling that will progress down to the foot and ankle.  This is normal after surgery.  Elevate the leg when you are not up walking on it.   It is important for you to complete the blood thinner medication as prescribed by your doctor.  Continue to use the breathing machine which will help keep your temperature down.  It is common for your temperature to cycle up and down following surgery, especially at night when you are not up moving around and exerting yourself.  The breathing machine keeps your lungs expanded and your temperature down.  RANGE OF MOTION AND STRENGTHENING EXERCISES  These exercises are designed to help you keep full movement of your hip joint. Follow your caregiver's or physical therapist's instructions. Perform all exercises about fifteen times, three times per day or as directed. Exercise both hips, even if you have had only one joint replacement. These exercises can be done on a training (exercise) mat, on the floor, on a table or on a bed. Use whatever works the best and is most comfortable for you. Use music or television while you are exercising so that the exercises are a pleasant break in your day. This   will make your life better with the exercises acting as a break in routine you can look forward to.  Lying on your back, slowly slide your foot toward your buttocks, raising your knee up off the floor. Then slowly slide your foot back down until your leg is straight again.  Lying on your back spread your legs as far apart as you can without causing discomfort.  Lying on your side, raise your upper leg and foot straight up from the floor as far as is comfortable. Slowly lower the leg and repeat.  Lying on your back, tighten up the  muscle in the front of your thigh (quadriceps muscles). You can do this by keeping your leg straight and trying to raise your heel off the floor. This helps strengthen the largest muscle supporting your knee.  Lying on your back, tighten up the muscles of your buttocks both with the legs straight and with the knee bent at a comfortable angle while keeping your heel on the floor.   SKILLED REHAB INSTRUCTIONS: If the patient is transferred to a skilled rehab facility following release from the hospital, a list of the current medications will be sent to the facility for the patient to continue.  When discharged from the skilled rehab facility, please have the facility set up the patient's Home Health Physical Therapy prior to being released. Also, the skilled facility will be responsible for providing the patient with their medications at time of release from the facility to include their pain medication and their blood thinner medication. If the patient is still at the rehab facility at time of the two week follow up appointment, the skilled rehab facility will also need to assist the patient in arranging follow up appointment in our office and any transportation needs.  MAKE SURE YOU:  Understand these instructions.  Will watch your condition.  Will get help right away if you are not doing well or get worse.  Pick up stool softner and laxative for home use following surgery while on pain medications. Daily dry dressing changes as needed. In 4 days, you may remove your dressings and begin taking showers - no tub baths or soaking the incisions. Continue to use ice for pain and swelling after surgery. Do not use any lotions or creams on the incision until instructed by your surgeon.   

## 2021-11-23 NOTE — Op Note (Signed)
OPERATIVE REPORT  SURGEON: Rod Can, MD   ASSISTANT: Staff.  PREOPERATIVE DIAGNOSIS: Comminuted Left intertrochanteric femur fracture.   POSTOPERATIVE DIAGNOSIS: Comminuted Left intertrochanteric femur fracture.   PROCEDURE: Intramedullary fixation, Left femur.   IMPLANTS: Biomet Affixus Hip Fracture Nail, 9 by 180 mm, 125 degrees. 10.5 x 90 mm Hip Fracture Nail Lag Screw. 5 x 36 mm distal interlocking screw 1.  ANESTHESIA:  General  ESTIMATED BLOOD LOSS:-200 mL    ANTIBIOTICS: 2 g Ancef.  DRAINS: None.  COMPLICATIONS: None.   CONDITION: PACU - hemodynamically stable.Marland Kitchen   BRIEF CLINICAL NOTE: Sara Rodriguez is a 88 y.o. female who presented with an intertrochanteric femur fracture. The patient was admitted to the hospitalist service and underwent perioperative risk stratification and medical optimization. The risks, benefits, and alternatives to the procedure were explained, and the patient elected to proceed.  PROCEDURE IN DETAIL: Surgical site was marked by myself. The patient was taken to the operating room and anesthesia was induced on the bed. The patient was then transferred to the [facility] table and the nonoperative lower extremity was scissored underneath the operative side. The fracture was reduced with traction, internal rotation, and adduction. The hip was prepped and draped in the normal sterile surgical fashion. Timeout was called verifying side and site of surgery. Preop antibiotics were given with 60 minutes of beginning the procedure.  Fluoroscopy was used to define the patient's anatomy. A 4 cm incision was made just proximal to the tip of the greater trochanter. The awl was used to obtain the standard starting point for a trochanteric entry nail under fluoroscopic control. The guidepin was placed. The entry reamer was used to open the proximal femur.  On the back table, the nail was assembled onto the jig. The nail was placed into the femur without any  difficulty. Through a separate stab incision, the cannula was placed down to the bone in preparation for the cephalomedullary device. A guidepin was placed into the femoral head using AP and lateral fluoroscopy views. The pin was measured, and then reaming was performed to the appropriate depth. The lag screw was inserted to the appropriate depth. The fracture was compressed through the jig. The setscrew was tightened and then loosened one quarter turn. A separate stab incision was created, and the distal interlocking screw was placed using standard AO technique. The jig was removed. Final AP and lateral fluoroscopy views were obtained to confirm fracture reduction and hardware placement. Tip apex distance was appropriate. There was no chondral penetration.  The wounds were copiously irrigated with saline. The wound was closed in layers with #1 Vicryl for the fascia, 2-0 Monocryl for the deep dermal layer, and and skin staples. Glue was applied to the skin. Once the glue was fully hardened, sterile dressing was applied. The patient was then awakened from anesthesia and taken to the PACU in stable condition. Sponge needle and instrument counts were correct at the end of the case 2. There were no known complications.  We will readmit the patient to the hospitalist. Weightbearing status will be weightbearing as tolerated with a walker. We will resume Eliquis at 2.5 mg PO BID tomorrow for DVT prophylaxis. The patient will work with physical therapy and undergo disposition planning.

## 2021-11-23 NOTE — Progress Notes (Signed)
Hgb 7.4 this morning (baseline around 10). Will transfuse 2 units PRBCs in preparation for surgery this afternoon. Please d/c heparin gtt at 10:30 am today.

## 2021-11-23 NOTE — Transfer of Care (Signed)
Immediate Anesthesia Transfer of Care Note  Patient: [name]  Procedure(s) Performed: INTRAMEDULLARY (IM) NAIL FEMORAL (Left)  Patient Location: PACU  Anesthesia Type:General  Level of Consciousness: awake  Airway & Oxygen Therapy: Patient Spontanous Breathing and Patient connected to face mask oxygen  Post-op Assessment: Report given to RN and Post -op Vital signs reviewed and stable  Post vital signs: Reviewed and stable  Last Vitals:  Vitals Value Taken Time  BP 145/71 11/23/21 1830  Temp    Pulse 25 11/23/21 1826  Resp 20 11/23/21 1831  SpO2 80 % 11/23/21 1826  Vitals shown include unvalidated device data.  Last Pain:  Vitals:   11/23/21 1634  TempSrc:   PainSc: 0-No pain      Patients Stated Pain Goal: 6 (17/71/16 5790)  Complications: No notable events documented.

## 2021-11-23 NOTE — Progress Notes (Signed)
Addressed patient's pain with Dr Lissa Hoard. May use dilaudid 0.5 mg q1 hour as needed.

## 2021-11-23 NOTE — Anesthesia Procedure Notes (Signed)
Procedure Name: Intubation Date/Time: 11/23/2021 4:49 PM Performed by: Montel Clock, CRNA Pre-anesthesia Checklist: Patient identified, Emergency Drugs available, Suction available, Patient being monitored and Timeout performed Patient Re-evaluated:Patient Re-evaluated prior to induction Oxygen Delivery Method: Circle system utilized Preoxygenation: Pre-oxygenation with 100% oxygen Induction Type: IV induction Ventilation: Mask ventilation without difficulty and Oral airway inserted - appropriate to patient size Laryngoscope Size: Mac and 3 Grade View: Grade I Tube type: Oral Tube size: 7.0 mm Number of attempts: 1 Airway Equipment and Method: Stylet Placement Confirmation: ETT inserted through vocal cords under direct vision, positive ETCO2 and breath sounds checked- equal and bilateral Secured at: 21 cm Tube secured with: Tape Dental Injury: Teeth and Oropharynx as per pre-operative assessment

## 2021-11-23 NOTE — Anesthesia Preprocedure Evaluation (Addendum)
Anesthesia Evaluation  Patient identified by MRN, date of birth, ID band Patient awake    Reviewed: Allergy & Precautions, H&P , NPO status , Patient's Chart, lab work & pertinent test results  Airway Mallampati: II  TM Distance: >3 FB Neck ROM: Full    Dental  (+) Dental Advisory Given, Edentulous Upper, Edentulous Lower   Pulmonary shortness of breath, pneumonia, PE   Pulmonary exam normal breath sounds clear to auscultation       Cardiovascular hypertension, Pt. on medications pulmonary hypertensionNormal cardiovascular exam+ Valvular Problems/Murmurs  Rhythm:Regular Rate:Normal  Echo 03/2016 - Left ventricle: The cavity size was normal. Wall thickness was normal. Systolic function was normal. The estimated ejection fraction was in the range of 55% to 60%. Wall motion was normal; there were no regional wall motion abnormalities. Doppler parameters are consistent with abnormal left ventricular relaxation (grade 1 diastolic dysfunction).  - Aortic valve: Trileaflet; mildly thickened, mildly calcified  leaflets.  - Mitral valve: There was mild regurgitation.  - Tricuspid valve: Mildly thickened leaflets. Moderate prolapse, involving the septal leaflet. There was moderate regurgitation.  - Pulmonary arteries: Systolic pressure was mildly to moderately increased. PA peak pressure: 47 mm Hg (S).    Neuro/Psych negative neurological ROS  negative psych ROS   GI/Hepatic Neg liver ROS, hiatal hernia, GERD  Medicated,  Endo/Other  negative endocrine ROS  Renal/GU negative Renal ROS  negative genitourinary   Musculoskeletal negative musculoskeletal ROS (+) Arthritis , Osteoarthritis,    Abdominal   Peds  Hematology negative hematology ROS (+)   Anesthesia Other Findings   Reproductive/Obstetrics negative OB ROS                            Anesthesia Physical  Anesthesia Plan  ASA:  4  Anesthesia Plan: General   Post-op Pain Management: Ofirmev IV (intra-op) and Dilaudid IV   Induction: Intravenous  PONV Risk Score and Plan: 4 or greater and Ondansetron, Dexamethasone and Treatment may vary due to age or medical condition  Airway Management Planned: Oral ETT  Additional Equipment:   Intra-op Plan:   Post-operative Plan: Extubation in OR  Informed Consent: I have reviewed the patients History and Physical, chart, labs and discussed the procedure including the risks, benefits and alternatives for the proposed anesthesia with the patient or authorized representative who has indicated his/her understanding and acceptance.     Dental advisory given  Plan Discussed with: CRNA  Anesthesia Plan Comments:       Anesthesia Quick Evaluation

## 2021-11-23 NOTE — Progress Notes (Addendum)
[facility]  PROGRESS NOTE  Sara Rodriguez IRJ:[id number] DOB: July 17, 1933 DOA: 11/21/2021 PCP: Wenda Low, MD   Brief HPI:   88 year old female with a history of PE, factor V Leiden on Eliquis, hypertension, recent cholecystectomy 7 days ago.  Fell at home, found to have closed left hip fracture.  Also found to have right upper lobe pneumonia.  Patient started on heparin gtt. H/o for pulmonary embolism    Subjective   Patient seen and examined, hemoglobin 6.8 this morning.  2 units of PRBC ordered per orthopedics.  Plan for surgery in the afternoon.  Denies shortness of breath.   Assessment/Plan:     Closed left hip fracture -Orthopedics consulted -Plan for surgery today  Right upper lobe pneumonia -Patient started on ceftriaxone and Zithromax -Currently requiring 2 L/min of oxygen via nasal cannula  Anemia -Hemoglobin down to 6.8, likely dilutional -Units PRBC ordered by orthopedics -Follow CBC in a.m. -Discontinue IV fluids  History of pulmonary embolism/factor V Leiden -Patient on Eliquis at -Eliquis is held, patient started on heparin gtt.  Hypertension -Blood pressure is well controlled -Continue amlodipine, irbesartan  Hyperlipidemia -Continue atorvastatin  S/p recent cholecystectomy -Stable  Medications     sodium chloride   Intravenous Once   amLODipine  5 mg Oral BID   atorvastatin  10 mg Oral QHS   chlorhexidine  60 mL Topical Once   Chlorhexidine Gluconate Cloth  6 each Topical Daily   feeding supplement  237 mL Oral BID BM   ferrous sulfate  325 mg Oral Q breakfast   irbesartan  300 mg Oral Daily   multivitamin with minerals  1 tablet Oral Daily   omega-3 acid ethyl esters  1 g Oral Q breakfast   pantoprazole  40 mg Oral Daily   povidone-iodine  2 application Topical Once     Data Reviewed:   CBG:  No results for input(s): GLUCAP in the last 168 hours.  SpO2: 100 % O2 Flow Rate (L/min): 2 L/min    Vitals:   11/22/21  2037 11/23/21 0553 11/23/21 1120 11/23/21 1144  BP: (!) 138/48 (!) 126/49 (!) 119/43 (!) 129/47  Pulse: 80 77 75 77  Resp: 20 13 16 16   Temp: 99.2 F (37.3 C) 98.7 F (37.1 C) 98.9 F (37.2 C) (!) 97.5 F (36.4 C)  TempSrc: Oral Oral Oral Oral  SpO2: 94% 97% 99% 100%  Weight:      Height:          Data Reviewed:  Basic Metabolic Panel: Recent Labs  Lab 11/21/21 2249 11/22/21 0209 11/23/21 0334  NA 130* 130* 133*  K 3.7 4.1 4.2  CL 96* 95* 99  CO2 24 26 27   GLUCOSE 173* 169* 137*  BUN 24* 26* 27*  CREATININE 1.12* 1.17* 1.19*  CALCIUM 9.1 8.8* 8.2*    CBC: Recent Labs  Lab 11/21/21 2249 11/22/21 0209 11/23/21 0334  WBC 18.5* 14.2* 18.2*  NEUTROABS 16.0*  --   --   HGB 10.1* 9.7* 7.4*  HCT 32.7* 31.0* 24.3*  MCV 83.2 83.8 84.7  PLT 250 241 202    LFT Recent Labs  Lab 11/23/21 0334  AST 19  ALT 12  ALKPHOS 49  BILITOT 0.2*  PROT 5.3*  ALBUMIN 2.4*     Antibiotics: Anti-infectives (From admission, onward)    Start     Dose/Rate Route Frequency Ordered Stop   11/23/21 1600  ceFAZolin (ANCEF) IVPB 2g/100 mL premix  2 g 200 mL/hr over 30 Minutes Intravenous On call to O.R. 11/23/21 7121 11/24/21 0559   11/22/21 0100  cefTRIAXone (ROCEPHIN) 2 g in sodium chloride 0.9 % 100 mL IVPB        2 g 200 mL/hr over 30 Minutes Intravenous Every 24 hours 11/22/21 0044     11/22/21 0100  azithromycin (ZITHROMAX) 500 mg in sodium chloride 0.9 % 250 mL IVPB        500 mg 250 mL/hr over 60 Minutes Intravenous Every 24 hours 11/22/21 0044          DVT prophylaxis: Patient on IV heparin  Code Status: Full code  Family Communication: Daughter at bedside   CONSULTS orthopedics   Objective    Physical Examination:   General-appears in no acute distress Heart-S1-S2, regular, no murmur auscultated Lungs-clear to auscultation bilaterally, no wheezing or crackles auscultated Abdomen-soft, nontender, no organomegaly Extremities-no edema in the  lower extremities Neuro-alert, oriented x3, no focal deficit noted  Status is: Inpatient for closed left hip fracture        [facility] If 7PM-7AM, please contact night-coverage at [email or web address], Office  [phone]   11/23/2021, 2:23 PM  LOS: 1 day

## 2021-11-24 ENCOUNTER — Encounter (HOSPITAL_COMMUNITY): Payer: Self-pay | Admitting: Orthopedic Surgery

## 2021-11-24 DIAGNOSIS — W19XXXA Unspecified fall, initial encounter: Secondary | ICD-10-CM

## 2021-11-24 DIAGNOSIS — S72142A Displaced intertrochanteric fracture of left femur, initial encounter for closed fracture: Secondary | ICD-10-CM | POA: Diagnosis not present

## 2021-11-24 DIAGNOSIS — S72002A Fracture of unspecified part of neck of left femur, initial encounter for closed fracture: Secondary | ICD-10-CM | POA: Diagnosis not present

## 2021-11-24 DIAGNOSIS — I10 Essential (primary) hypertension: Secondary | ICD-10-CM | POA: Diagnosis not present

## 2021-11-24 DIAGNOSIS — D62 Acute posthemorrhagic anemia: Secondary | ICD-10-CM

## 2021-11-24 DIAGNOSIS — D649 Anemia, unspecified: Secondary | ICD-10-CM

## 2021-11-24 LAB — CBC
HCT: 29.5 % — ABNORMAL LOW (ref 36.0–46.0)
Hemoglobin: 9.5 g/dL — ABNORMAL LOW (ref 12.0–15.0)
MCH: 28.3 pg (ref 26.0–34.0)
MCHC: 32.2 g/dL (ref 30.0–36.0)
MCV: 87.8 fL (ref 80.0–100.0)
Platelets: 175 10*3/uL (ref 150–400)
RBC: 3.36 MIL/uL — ABNORMAL LOW (ref 3.87–5.11)
RDW: 16.7 % — ABNORMAL HIGH (ref 11.5–15.5)
WBC: 17.6 10*3/uL — ABNORMAL HIGH (ref 4.0–10.5)
nRBC: 0 % (ref 0.0–0.2)

## 2021-11-24 LAB — TYPE AND SCREEN
ABO/RH(D): A POS
Antibody Screen: NEGATIVE
Unit division: 0
Unit division: 0

## 2021-11-24 LAB — POCT I-STAT EG7
Acid-Base Excess: 4 mmol/L — ABNORMAL HIGH (ref 0.0–2.0)
Bicarbonate: 26.3 mmol/L (ref 20.0–28.0)
Calcium, Ion: 1.1 mmol/L — ABNORMAL LOW (ref 1.15–1.40)
HCT: 32 % — ABNORMAL LOW (ref 36.0–46.0)
Hemoglobin: 10.9 g/dL — ABNORMAL LOW (ref 12.0–15.0)
O2 Saturation: 80 %
Potassium: 5.2 mmol/L — ABNORMAL HIGH (ref 3.5–5.1)
Sodium: 140 mmol/L (ref 135–145)
TCO2: 27 mmol/L (ref 22–32)
pCO2, Ven: 32 mmHg — ABNORMAL LOW (ref 44.0–60.0)
pH, Ven: 7.522 — ABNORMAL HIGH (ref 7.250–7.430)
pO2, Ven: 39 mmHg (ref 32.0–45.0)

## 2021-11-24 LAB — BPAM RBC
Blood Product Expiration Date: 202302262359
Blood Product Expiration Date: 202302262359
ISSUE DATE / TIME: 202302071110
ISSUE DATE / TIME: 202302071443
Unit Type and Rh: 6200
Unit Type and Rh: 6200

## 2021-11-24 LAB — COMPREHENSIVE METABOLIC PANEL
ALT: 16 U/L (ref 0–44)
AST: 28 U/L (ref 15–41)
Albumin: 2.4 g/dL — ABNORMAL LOW (ref 3.5–5.0)
Alkaline Phosphatase: 59 U/L (ref 38–126)
Anion gap: 8 (ref 5–15)
BUN: 23 mg/dL (ref 8–23)
CO2: 25 mmol/L (ref 22–32)
Calcium: 7.9 mg/dL — ABNORMAL LOW (ref 8.9–10.3)
Chloride: 98 mmol/L (ref 98–111)
Creatinine, Ser: 1.03 mg/dL — ABNORMAL HIGH (ref 0.44–1.00)
GFR, Estimated: 53 mL/min — ABNORMAL LOW (ref >60)
Glucose, Bld: 146 mg/dL — ABNORMAL HIGH (ref 70–99)
Potassium: 4.1 mmol/L (ref 3.5–5.1)
Sodium: 131 mmol/L — ABNORMAL LOW (ref 135–145)
Total Bilirubin: 0.5 mg/dL (ref 0.3–1.2)
Total Protein: 5.5 g/dL — ABNORMAL LOW (ref 6.5–8.1)

## 2021-11-24 MED ORDER — IRBESARTAN 150 MG PO TABS
300.0000 mg | ORAL_TABLET | Freq: Every day | ORAL | Status: DC
  Administered 2021-11-25: 300 mg via ORAL
  Filled 2021-11-24: qty 2

## 2021-11-24 MED ORDER — MORPHINE SULFATE (PF) 2 MG/ML IV SOLN: 2.0000 mg | INTRAVENOUS | Status: DC | PRN

## 2021-11-24 MED ORDER — AMOXICILLIN-POT CLAVULANATE 875-125 MG PO TABS
1.0000 | ORAL_TABLET | Freq: Two times a day (BID) | ORAL | Status: DC
  Administered 2021-11-24 – 2021-11-25 (×3): 1 via ORAL
  Filled 2021-11-24 (×3): qty 1

## 2021-11-24 MED ORDER — POLYETHYLENE GLYCOL 3350 17 G PO PACK
17.0000 g | PACK | Freq: Two times a day (BID) | ORAL | Status: DC
Start: 2021-11-24 — End: 2021-11-25
  Administered 2021-11-24 (×2): 17 g via ORAL
  Filled 2021-11-24 (×2): qty 1

## 2021-11-24 MED ORDER — OXYCODONE-ACETAMINOPHEN 5-325 MG PO TABS
1.0000 | ORAL_TABLET | ORAL | Status: DC | PRN
  Administered 2021-11-24 – 2021-11-25 (×3): 1 via ORAL
  Filled 2021-11-24 (×3): qty 1

## 2021-11-24 MED ORDER — BOOST / RESOURCE BREEZE PO LIQD CUSTOM
1.0000 | Freq: Three times a day (TID) | ORAL | Status: DC
  Administered 2021-11-24 – 2021-11-25 (×5): 1 via ORAL

## 2021-11-24 NOTE — Assessment & Plan Note (Addendum)
Hemoglobin this morning is 9 status post 2 units of packed red blood cells continue to monitor hemoglobin intermittently. I have reviewed her CBC, recheck tomorrow morning.

## 2021-11-24 NOTE — Anesthesia Postprocedure Evaluation (Signed)
Anesthesia Post Note  Patient: Sara Rodriguez  Procedure(s) Performed: INTRAMEDULLARY (IM) NAIL FEMORAL (Left)     Patient location during evaluation: PACU Anesthesia Type: General Level of consciousness: sedated and patient cooperative Pain management: pain level controlled Vital Signs Assessment: post-procedure vital signs reviewed and stable Respiratory status: spontaneous breathing Cardiovascular status: stable Anesthetic complications: no   No notable events documented.  Last Vitals:  Vitals:   11/23/21 2140 11/24/21 0058  BP: (!) 119/51 (!) 137/49  Pulse: 72 69  Resp: 16 16  Temp: 36.8 C 36.7 C  SpO2: 99% 100%    Last Pain:  Vitals:   11/24/21 0246  TempSrc:   PainSc: [place]

## 2021-11-24 NOTE — Evaluation (Signed)
Physical Therapy Evaluation Patient Details Name: Sara Rodriguez MRN: [id number] DOB: 1932-11-13 Today's Date: 11/24/2021  History of Present Illness  Pt is 88 yo female admitted on 11/21/21 after a fall with L hip fx s/p IM Nailing on 11/23/21.  Pt with recent cholecystectomy 11/16/21.  Other hx includes but is not limited to PE, factor v Leiden on eliquis, HTN, IVC filter  Clinical Impression  Pt admitted with above diagnosis. At baseline, pt is independent and lives alone (she even carries her own wood into home for heat stove). Pt very motivated and with fair pain control for POD #1 (pain eases with rest).  She required +2 mod for bed mobility and had +2 assist for OOB for safety. Pt able to take a few steps at bedside.  Considering age, in bed since 11/21/21, and POD #1 pt did very  well with mobility.  She is expected to progress well.  Pt currently with functional limitations due to the deficits listed below (see PT Problem List). Pt will benefit from skilled PT to increase their independence and safety with mobility to allow discharge to the venue listed below.          Recommendations for follow up therapy are one component of a multi-disciplinary discharge planning process, led by the attending physician.  Recommendations may be updated based on patient status, additional functional criteria and insurance authorization.  Follow Up Recommendations Skilled nursing-short term rehab (<3 hours/day)    Assistance Recommended at Discharge Frequent or constant Supervision/Assistance  Patient can return home with the following  A lot of help with walking and/or transfers;A lot of help with bathing/dressing/bathroom;Help with stairs or ramp for entrance;Assistance with cooking/housework    Equipment Recommendations Other (comment) (defer to post acute)  Recommendations for Other Services       Functional Status Assessment Patient has had a recent decline in their functional status and  demonstrates the ability to make significant improvements in function in a reasonable and predictable amount of time.     Precautions / Restrictions Precautions Precautions: Fall Restrictions Weight Bearing Restrictions: Yes LLE Weight Bearing: Weight bearing as tolerated      Mobility  Bed Mobility Overal bed mobility: Needs Assistance Bed Mobility: Sit to Supine       Sit to supine: Mod assist, +2 for physical assistance   General bed mobility comments: increased time, cues for sequencing, assist for L LE and lifting trunk; pt scooting hips to edge with cues    Transfers Overall transfer level: Needs assistance Equipment used: Rolling walker (2 wheels) Transfers: Sit to/from Stand Sit to Stand: Min assist, Mod assist, +2 physical assistance           General transfer comment: Performed x 1 from bed and x 2 from [facility] with min-mod A of 2 to rise and cues for hand placement    Ambulation/Gait Ambulation/Gait assistance: Min assist, +2 safety/equipment Gait Distance (Feet): 5 Feet (3' to bsc then 5' to chair) Assistive device: Rolling walker (2 wheels) Gait Pattern/deviations: Step-to pattern, Decreased stride length, Decreased weight shift to left, Shuffle Gait velocity: decreased     General Gait Details: Requiring cues for sequencing and RW use, min A of 2 for safety to steady, ambulated to bsc then to chair -tends to shuflle feet with decreased weight on L LE  Stairs            Wheelchair Mobility    Modified Rankin (Stroke Patients Only)  Balance Overall balance assessment: Needs assistance Sitting-balance support: No upper extremity supported Sitting balance-Leahy Scale: Good     Standing balance support: Bilateral upper extremity supported Standing balance-Leahy Scale: Poor Standing balance comment: Requiring RW; min A for balance and total A for ADLs in standing                             Pertinent Vitals/Pain Pain  Assessment Pain Assessment: 0-10 Pain Score: 4  Pain Location: L hip with movement Pain Descriptors / Indicators: Discomfort, Grimacing, Sore Pain Intervention(s): Limited activity within patient's tolerance, Monitored during session, Premedicated before session, Repositioned, Ice applied    Home Living Family/patient expects to be discharged to:: Private residence Living Arrangements: Alone Available Help at Discharge: Family;Available PRN/intermittently Type of Home: House Home Access: Stairs to enter   [employer] of Steps: 2   Home Layout: One level Home Equipment: Shower seat;Grab bars - tub/shower;Rolling Walker (2 wheels)      Prior Function Prior Level of Function : Independent/Modified Independent             Mobility Comments: Pt very independent and could ambulate in community without AD.  Pt has never driven.  She does carry her own wood into the house for heat stove.  Pt recently had cholecystectomy and had returned to basic ADLS, IADLS, and mobility       Hand Dominance        Extremity/Trunk Assessment   Upper Extremity Assessment Upper Extremity Assessment: Overall WFL for tasks assessed (ROM WFL and MMT grossly 5/5; very strong particularly for age)    Lower Extremity Assessment Lower Extremity Assessment: LLE deficits/detail;RLE deficits/detail RLE Deficits / Details: ROM WFL; MMT 5/5 LLE Deficits / Details: ROM: WFL, hip functional but limited by pain; MMT: ankle 5/5, knee 2/5, hip 1/5 LLE Sensation: WNL    Cervical / Trunk Assessment Cervical / Trunk Assessment: Normal  Communication   Communication: HOH  Cognition Arousal/Alertness: Awake/alert Behavior During Therapy: WFL for tasks assessed/performed Overall Cognitive Status: Within Functional Limits for tasks assessed                                 General Comments: Daughter and granddaughter present        General Comments General comments (skin integrity,  edema, etc.): Daughter and granddaughter present.    Exercises General Exercises - Lower Extremity Ankle Circles/Pumps: AROM, Both, 10 reps, Supine Quad Sets: AROM, 10 reps, Both, Supine Gluteal Sets: AROM, Both, 10 reps, Supine Heel Slides: AAROM, Left, 10 reps, Supine Other Exercises Other Exercises: cues for exercise and AAROM as needed   Assessment/Plan    PT Assessment Patient needs continued PT services  PT Problem List Decreased strength;Decreased mobility;Decreased range of motion;Decreased coordination;Decreased knowledge of precautions;Decreased activity tolerance;Decreased balance;Decreased knowledge of use of DME;Pain       PT Treatment Interventions DME instruction;Therapeutic activities;Modalities;Gait training;Therapeutic exercise;Patient/family education;Balance training;Functional mobility training    PT Goals (Current goals can be found in the Care Plan section)  Acute Rehab PT Goals Patient Stated Goal: return home after short stay at SNF PT Goal Formulation: With patient/family Time For Goal Achievement: 12/08/21 Potential to Achieve Goals: Good    Frequency Min 3X/week     Co-evaluation               AM-PAC PT "6 Clicks" Mobility  Outcome Measure Help needed  turning from your back to your side while in a flat bed without using bedrails?: A Lot Help needed moving from lying on your back to sitting on the side of a flat bed without using bedrails?: Total Help needed moving to and from a bed to a chair (including a wheelchair)?: A Lot Help needed standing up from a chair using your arms (e.g., wheelchair or bedside chair)?: A Lot Help needed to walk in hospital room?: Total Help needed climbing 3-5 steps with a railing? : Total 6 Click Score: 9    End of Session Equipment Utilized During Treatment: Gait belt Activity Tolerance: Patient tolerated treatment well Patient left: with chair alarm set;in chair;with call bell/phone within reach Nurse  Communication: Mobility status;Other (comment) (assist of 2 for transfers; left BM in BSC due to dark green in color and wanted RN to check) PT Visit Diagnosis: Other abnormalities of gait and mobility (R26.89);Muscle weakness (generalized) (M62.81)    Time: 6767-2094 PT Time Calculation (min) (ACUTE ONLY): 33 min   Charges:   PT Evaluation $PT Eval Moderate Complexity: 1 Mod PT Treatments $Therapeutic Activity: 8-22 mins        Abran Richard, PT Acute Rehab Services Pager [phone] [facility] [phone]   Karlton Lemon 11/24/2021, 2:17 PM

## 2021-11-24 NOTE — TOC Progression Note (Addendum)
Transition of Care [facility]) - Progression Note    Patient Details  Name: Sara Rodriguez MRN: [id number] Date of Birth: 04-May-1933  Transition of [facility]) CM/SW Contact  Lennart Pall, LCSW Phone Number: 11/24/2021, 2:13 PM  Clinical Narrative:    Alerted by PT that she is recommending SNF rehab for pt.  Have met with pt and daughters who are aware of recs and in agreement with this plan.  They would prefer [facility] as first choice if possible.  SNF bed search begun.  ADDENDUM: Pt has accepted SNF bed at [facility] who can admit pt tomorrow.  Have received insurance auth (ref# [id number]).  MD aware and will need another COVID test.   Expected Discharge Plan: [place] Barriers to Discharge: Continued Medical Work up  Expected Discharge Plan and Services Expected Discharge Plan: [place] In-house Referral: Clinical Social Work     Living arrangements for the past 2 months: Single Family Home                                       Social Determinants of Health (SDOH) Interventions    Readmission Risk Interventions Readmission Risk Prevention Plan 11/22/2021  Transportation Screening Complete  PCP or Specialist Appt within 5-7 Days Complete  Home Care Screening Complete  Medication Review (RN CM) Complete  Some recent data might be hidden

## 2021-11-24 NOTE — NC FL2 (Signed)
[place] LEVEL OF CARE SCREENING TOOL     IDENTIFICATION  Patient Name: Sara Rodriguez Birthdate: July 07, 1933 Sex: female Admission Date (Current Location): 11/21/2021  [facility] and [place] Number:  Herbalist and Address:  [facility],  [place], [place]      Provider Number: [id number]  Attending Physician Name and Address:  Aileen Fass, Tammi Klippel, MD  Relative Name and Phone Number:       Current Level of Care: Hospital Recommended Level of Care: [place] Prior Approval Number:    Date Approved/Denied:   PASRR Number: [id number]  Discharge Plan: SNF    Current Diagnoses: Patient Active Problem List   Diagnosis Date Noted   Acute postoperative anemia due to expected blood loss 11/24/2021   Normocytic anemia 11/24/2021   History of pulmonary embolism 11/22/2021   Right upper lobe pneumonia 11/22/2021   Closed left hip fracture, initial encounter ([place]) 11/22/2021   S/P cholecystectomy 11/16/2021   Primary localized osteoarthritis of left knee    Hypertension    Factor V Leiden ([place])    Essential hypertension 03/25/2016   Hyperlipidemia 03/25/2016   Dyspnea on exertion 03/25/2016   PULMONARY EMBOLISM 01/14/2010   PLEURAL EFFUSION, RIGHT 01/14/2010   MASS, LUNG 01/14/2010   Pulmonary embolism ([place]) 10/17/2009    Orientation RESPIRATION BLADDER Height & Weight     Self, Time, Situation, Place  Normal Continent Weight: 164 lb 14.5 oz (74.8 kg) Height:  5\' 3"  (160 cm)  BEHAVIORAL SYMPTOMS/MOOD NEUROLOGICAL BOWEL NUTRITION STATUS      Continent    AMBULATORY STATUS COMMUNICATION OF NEEDS Skin   Limited Assist Verbally Other (Comment) (Surgical incision only)                       Personal Care Assistance Level of Assistance  Bathing, Dressing Bathing Assistance: Limited assistance   Dressing Assistance: Limited assistance     Functional Limitations Info             [place]  PT (By licensed PT), OT (By licensed OT)     PT Frequency: 5x/wk OT Frequency: 5x/wk            Contractures Contractures Info: Not present    Additional Factors Info  Code Status, Allergies Code Status Info: Full Allergies Info: NKDA           Current Medications (11/24/2021):  This is the current hospital active medication list Current Facility-Administered Medications  Medication Dose Route Frequency Provider Last Rate Last Admin   0.9 %  sodium chloride infusion   Intravenous Continuous Rod Can, MD 75 mL/hr at 11/23/21 2118 New Bag at 11/23/21 2118   albuterol (PROVENTIL) (2.5 MG/3ML) 0.083% nebulizer solution 2.5 mg  2.5 mg Nebulization Q4H PRN Swinteck, Aaron Edelman, MD       alum & mag hydroxide-simeth (MAALOX/MYLANTA) 200-200-20 MG/5ML suspension 15 mL  15 mL Oral Q4H PRN Swinteck, Aaron Edelman, MD   15 mL at 11/24/21 1220   amLODipine (NORVASC) tablet 5 mg  5 mg Oral BID Rod Can, MD   5 mg at 11/24/21 1007   amoxicillin-clavulanate (AUGMENTIN) 875-125 MG per tablet 1 tablet  1 tablet Oral Q12H Charlynne Cousins, MD   1 tablet at 11/24/21 1007   apixaban (ELIQUIS) tablet 2.5 mg  2.5 mg Oral BID Rod Can, MD   2.5 mg at 11/24/21 1007   atorvastatin (LIPITOR) tablet 10 mg  10 mg Oral QHS Swinteck, Aaron Edelman, MD   10 mg at 11/23/21 2251   azithromycin (ZITHROMAX) 500 mg in sodium chloride 0.9 % 250 mL IVPB  500 mg Intravenous Q24H Rod Can, MD   Stopped at 11/24/21 0454   docusate sodium (COLACE) capsule 100 mg  100 mg Oral BID Rod Can, MD   100 mg at 11/24/21 1007   feeding supplement (BOOST / RESOURCE BREEZE) liquid 1 Container  1 Container Oral TID BM Charlynne Cousins, MD   1 Container at 11/24/21 1014   ferrous sulfate tablet 325 mg  325 mg Oral Q breakfast Rod Can, MD   325 mg at 11/24/21 0839   [START ON 11/25/2021] irbesartan (AVAPRO) tablet 300 mg  300 mg Oral Daily Charlynne Cousins, MD        menthol-cetylpyridinium (CEPACOL) lozenge 3 mg  1 lozenge Oral PRN Rod Can, MD       Or   phenol (CHLORASEPTIC) mouth spray 1 spray  1 spray Mouth/Throat PRN Swinteck, Aaron Edelman, MD       methocarbamol (ROBAXIN) tablet 500 mg  500 mg Oral Q6H PRN Rod Can, MD   500 mg at 11/24/21 1226   Or   methocarbamol (ROBAXIN) 500 mg in dextrose 5 % 50 mL IVPB  500 mg Intravenous Q6H PRN Swinteck, Aaron Edelman, MD       metoCLOPramide (REGLAN) tablet 5-10 mg  5-10 mg Oral Q8H PRN Swinteck, Aaron Edelman, MD       Or   metoCLOPramide (REGLAN) injection 5-10 mg  5-10 mg Intravenous Q8H PRN Swinteck, Aaron Edelman, MD       morphine (PF) 2 MG/ML injection 2 mg  2 mg Intravenous Q2H PRN Charlynne Cousins, MD       multivitamin with minerals tablet 1 tablet  1 tablet Oral Daily Swinteck, Aaron Edelman, MD   1 tablet at 11/24/21 1007   naphazoline-glycerin (CLEAR EYES REDNESS) ophth solution 2 drop  2 drop Both Eyes QID PRN Swinteck, Aaron Edelman, MD       omega-3 acid ethyl esters (LOVAZA) capsule 1 g  1 g Oral Q breakfast Swinteck, Aaron Edelman, MD   1 g at 11/24/21 0838   ondansetron (ZOFRAN) injection 4 mg  4 mg Intravenous Q6H PRN Swinteck, Aaron Edelman, MD   4 mg at 11/23/21 0127   ondansetron (ZOFRAN) tablet 4 mg  4 mg Oral Q6H PRN Swinteck, Aaron Edelman, MD       Or   ondansetron (ZOFRAN) injection 4 mg  4 mg Intravenous Q6H PRN Swinteck, Aaron Edelman, MD       oxyCODONE-acetaminophen (PERCOCET/ROXICET) 5-325 MG per tablet 1-2 tablet  1-2 tablet Oral Q4H PRN Charlynne Cousins, MD       pantoprazole (PROTONIX) EC tablet 40 mg  40 mg Oral Daily Swinteck, Aaron Edelman, MD   40 mg at 11/24/21 1007   polyethylene glycol (MIRALAX / GLYCOLAX) packet 17 g  17 g Oral BID Charlynne Cousins, MD   17 g at 11/24/21 1006   polyethylene glycol (MIRALAX / GLYCOLAX) packet 8.5 g  8.5 g Oral Daily PRN Rod Can, MD       senna Donavan Burnet) tablet 8.6 mg  1 tablet Oral BID Rod Can, MD   8.6 mg at 11/24/21 1007   Facility-Administered Medications Ordered in Other  Encounters  Medication Dose Route Frequency Provider Last Rate Last Admin   acetaminophen (OFIRMEV) IV   Intravenous Anesthesia Intra-op Cynda Familia, CRNA   1,000 mg at 11/23/21 1719   dexamethasone (  DECADRON) injection   Intravenous Anesthesia Intra-op Montel Clock, CRNA   8 mg at 11/23/21 1707   fentaNYL (SUBLIMAZE) injection   Intravenous Anesthesia Intra-op Montel Clock, CRNA   25 mcg at 11/23/21 1820   lidocaine (cardiac) 100 mg/33mL (XYLOCAINE) injection 2%   Tracheal Tube Anesthesia Intra-op Montel Clock, CRNA   60 mg at 11/23/21 1645   ondansetron (ZOFRAN) injection   Intravenous Anesthesia Intra-op Cynda Familia, CRNA   4 mg at 11/23/21 1715   propofol (DIPRIVAN) 10 mg/mL bolus/IV push   Intravenous Anesthesia Intra-op Montel Clock, CRNA   90 mg at 11/23/21 1647   rocuronium (ZEMURON) injection   Intravenous Anesthesia Intra-op Montel Clock, CRNA   60 mg at 11/23/21 1648   sugammadex sodium (BRIDION) injection   Intravenous Anesthesia Intra-op Cynda Familia, CRNA   200 mg at 11/23/21 1805     Discharge Medications: Please see discharge summary for a list of discharge medications.  Relevant Imaging Results:  Relevant Lab Results:   Additional Information SS# [id number]; COVID vax x 2  Sireen Halk, LCSW

## 2021-11-24 NOTE — Progress Notes (Signed)
Received report, Assuming care of patient at this time. Patient alert and oriented, and resting in bed.

## 2021-11-24 NOTE — Assessment & Plan Note (Addendum)
6.8 on admission status post 2 units of packed red blood cells. Have reviewed his CBC this morning is 8.6 No signs of obvious bleeding

## 2021-11-24 NOTE — Assessment & Plan Note (Addendum)
Eliquis was held now restarted

## 2021-11-24 NOTE — Hospital Course (Signed)
88 year old with past medical history of PE, factor V Leyden on Eliquis, essential hypertension recent cholecystectomy 7 days ago who had a mechanical fall at home started complaining of left hip pain was found to have a left hip comminuted intertrochanteric fracture orthopedic surgery was consulted and she is status post surgical intervention on 11/23/2021 status post intramedullary fixation of the left femur

## 2021-11-24 NOTE — Plan of Care (Signed)
  Problem: Nutrition: Goal: Adequate nutrition will be maintained Outcome: Progressing   Problem: Coping: Goal: Level of anxiety will decrease Outcome: Progressing   Problem: Elimination: Goal: Will not experience complications related to urinary retention Outcome: Progressing   Problem: Pain Managment: Goal: General experience of comfort will improve Outcome: Progressing   

## 2021-11-24 NOTE — Progress Notes (Signed)
Progress Note   Patient: Sara Rodriguez ESP:[id number] DOB: 1932/11/09 DOA: 11/21/2021     2 DOS: the patient was seen and examined on 11/24/2021   Brief hospital course: 88 year old with past medical history of PE, factor V Leyden on Eliquis, essential hypertension recent cholecystectomy 7 days ago who had a mechanical fall at home started complaining of left hip pain was found to have a left hip comminuted intertrochanteric fracture orthopedic surgery was consulted and she is status post surgical intervention on 11/23/2021 status post intramedullary fixation of the left femur  Assessment and Plan: * Closed left hip fracture, initial encounter [facility])- (present on admission) Status post surgical intervention on 11/23/2021 Eliquis has been held, orthopedic surgery to dictate when to restart. Out of bed to chair. Physical therapy has been consulted. Minimal appetite. Changed to regular diet started on boost supplement    Right upper lobe pneumonia- (present on admission) Chest x-ray showed possible developing right upper lobe infiltrate. She did have leukocytosis on admission with a recent surgical intervention for her gallbladder. She is afebrile, her leukocytosis could be stress demargination possibly due to fracture versus infectious etiology. Now on oral Augmentin as she is was probably at risk of aspiration during her surgical procedure.  Continue azithromycin. She has remained afebrile. Culture data has remained negative till date.  Acute postoperative anemia due to expected blood loss 6.8 on admission status post 2 units of packed red blood cells. Have reviewed his CBC this morning is 9.5. No signs of obvious bleeding   Essential hypertension- (present on admission) Blood pressures well controlled continue to hold the Persantine and hydrochlorothiazide. Use hydralazine as needed. Cont amlodipine and hold irbesartan; hold HCTZ  Factor V Leiden ([place])- (present on admission) Was  restarted on Eliquis.  History of pulmonary embolism- (present on admission) Heparin discontinued started back on Eliquis.  Normocytic anemia Hemoglobin this morning is 9 status post 2 units of packed red blood cells continue to monitor hemoglobin intermittently. I have reviewed her CBC, recheck tomorrow morning.  S/P cholecystectomy Doing well from cholecystectomy standpoint.        Subjective:  She relates her pain is now controlled, she has not had a bowel movement No appetite  Physical Exam: Vitals:   11/23/21 2140 11/24/21 0058 11/24/21 0545 11/24/21 0904  BP: (!) 119/51 (!) 137/49 (!) 127/44 (!) 147/51  Pulse: 72 69 70 79  Resp: 16 16 16 20   Temp: 98.2 F (36.8 C) 98 F (36.7 C) 98.2 F (36.8 C) 98.7 F (37.1 C)  TempSrc: Oral Oral Oral Oral  SpO2: 99% 100% 98% 100%  Weight:      Height:       General exam: In no acute distress. Respiratory system: Good air movement and clear to auscultation. Cardiovascular system: S1 & S2 heard, RRR. No JVD. Gastrointestinal system: Abdomen is nondistended, soft and nontender.  Extremities: No pedal edema. Skin: No rashes, lesions or ulcers Psychiatry: Judgement and insight appear normal. Mood & affect appropriate.  Data Reviewed:  I have Reviewed nursing notes, Vitals, and Lab results since pt's last encounter. Pertinent lab results her hemoglobin appears to be stable repeated basic metabolic panel tomorrow, her leukocytosis is improving. I have ordered test including CBC to follow-up hemoglobin and white blood cell count. I have reviewed the last note from from the nurses [facility] and physicians,    Family Communication: Granddaughter  Disposition: Status is: Inpatient Remains inpatient appropriate because: Closed left hip fracture  Planned Discharge Destination: Skilled nursing facility     Time spent: 35 minutes  Author: Charlynne Cousins, MD 11/24/2021 9:57 AM  For on call review  [email or web address].

## 2021-11-25 DIAGNOSIS — S72002D Fracture of unspecified part of neck of left femur, subsequent encounter for closed fracture with routine healing: Secondary | ICD-10-CM | POA: Diagnosis not present

## 2021-11-25 DIAGNOSIS — Z79899 Other long term (current) drug therapy: Secondary | ICD-10-CM | POA: Diagnosis not present

## 2021-11-25 DIAGNOSIS — Z7401 Bed confinement status: Secondary | ICD-10-CM | POA: Diagnosis not present

## 2021-11-25 DIAGNOSIS — S7292XD Unspecified fracture of left femur, subsequent encounter for closed fracture with routine healing: Secondary | ICD-10-CM | POA: Diagnosis not present

## 2021-11-25 DIAGNOSIS — D62 Acute posthemorrhagic anemia: Secondary | ICD-10-CM | POA: Diagnosis not present

## 2021-11-25 DIAGNOSIS — I10 Essential (primary) hypertension: Secondary | ICD-10-CM | POA: Diagnosis not present

## 2021-11-25 DIAGNOSIS — E559 Vitamin D deficiency, unspecified: Secondary | ICD-10-CM | POA: Diagnosis not present

## 2021-11-25 DIAGNOSIS — Z4789 Encounter for other orthopedic aftercare: Secondary | ICD-10-CM | POA: Diagnosis not present

## 2021-11-25 DIAGNOSIS — I959 Hypotension, unspecified: Secondary | ICD-10-CM | POA: Diagnosis not present

## 2021-11-25 DIAGNOSIS — D649 Anemia, unspecified: Secondary | ICD-10-CM | POA: Diagnosis not present

## 2021-11-25 DIAGNOSIS — S72142A Displaced intertrochanteric fracture of left femur, initial encounter for closed fracture: Secondary | ICD-10-CM | POA: Diagnosis not present

## 2021-11-25 DIAGNOSIS — Z4889 Encounter for other specified surgical aftercare: Secondary | ICD-10-CM | POA: Diagnosis not present

## 2021-11-25 DIAGNOSIS — S72002A Fracture of unspecified part of neck of left femur, initial encounter for closed fracture: Secondary | ICD-10-CM | POA: Diagnosis not present

## 2021-11-25 DIAGNOSIS — Z9049 Acquired absence of other specified parts of digestive tract: Secondary | ICD-10-CM | POA: Diagnosis not present

## 2021-11-25 DIAGNOSIS — S72142D Displaced intertrochanteric fracture of left femur, subsequent encounter for closed fracture with routine healing: Secondary | ICD-10-CM | POA: Diagnosis not present

## 2021-11-25 DIAGNOSIS — J189 Pneumonia, unspecified organism: Secondary | ICD-10-CM | POA: Diagnosis not present

## 2021-11-25 DIAGNOSIS — D6852 Prothrombin gene mutation: Secondary | ICD-10-CM | POA: Diagnosis not present

## 2021-11-25 DIAGNOSIS — M25552 Pain in left hip: Secondary | ICD-10-CM | POA: Diagnosis not present

## 2021-11-25 DIAGNOSIS — R69 Illness, unspecified: Secondary | ICD-10-CM | POA: Diagnosis not present

## 2021-11-25 DIAGNOSIS — R197 Diarrhea, unspecified: Secondary | ICD-10-CM | POA: Diagnosis not present

## 2021-11-25 DIAGNOSIS — D6851 Activated protein C resistance: Secondary | ICD-10-CM | POA: Diagnosis not present

## 2021-11-25 DIAGNOSIS — N390 Urinary tract infection, site not specified: Secondary | ICD-10-CM | POA: Diagnosis not present

## 2021-11-25 DIAGNOSIS — R1032 Left lower quadrant pain: Secondary | ICD-10-CM | POA: Diagnosis not present

## 2021-11-25 DIAGNOSIS — D72829 Elevated white blood cell count, unspecified: Secondary | ICD-10-CM | POA: Diagnosis not present

## 2021-11-25 DIAGNOSIS — W19XXXD Unspecified fall, subsequent encounter: Secondary | ICD-10-CM | POA: Diagnosis not present

## 2021-11-25 LAB — CBC
HCT: 26.8 % — ABNORMAL LOW (ref 36.0–46.0)
Hemoglobin: 8.2 g/dL — ABNORMAL LOW (ref 12.0–15.0)
MCH: 28 pg (ref 26.0–34.0)
MCHC: 30.6 g/dL (ref 30.0–36.0)
MCV: 91.5 fL (ref 80.0–100.0)
Platelets: 195 10*3/uL (ref 150–400)
RBC: 2.93 MIL/uL — ABNORMAL LOW (ref 3.87–5.11)
RDW: 17.4 % — ABNORMAL HIGH (ref 11.5–15.5)
WBC: 18.2 10*3/uL — ABNORMAL HIGH (ref 4.0–10.5)
nRBC: 0 % (ref 0.0–0.2)

## 2021-11-25 LAB — RESP PANEL BY RT-PCR (FLU A&B, COVID) ARPGX2
Influenza A by PCR: NEGATIVE
Influenza B by PCR: NEGATIVE
SARS Coronavirus 2 by RT PCR: NEGATIVE

## 2021-11-25 MED ORDER — AMOXICILLIN-POT CLAVULANATE 875-125 MG PO TABS: 1.0000 | ORAL_TABLET | Freq: Two times a day (BID) | ORAL | 0 refills | Status: AC

## 2021-11-25 MED ORDER — AZITHROMYCIN 500 MG PO TABS: 500.0000 mg | ORAL_TABLET | Freq: Every day | ORAL | 0 refills | Status: AC

## 2021-11-25 MED ORDER — METHOCARBAMOL 500 MG PO TABS: 500.0000 mg | ORAL_TABLET | Freq: Four times a day (QID) | ORAL | Status: AC | PRN

## 2021-11-25 MED ORDER — DOCUSATE SODIUM 100 MG PO CAPS: 100.0000 mg | ORAL_CAPSULE | Freq: Two times a day (BID) | ORAL | 0 refills | Status: DC

## 2021-11-25 MED ORDER — OXYCODONE-ACETAMINOPHEN 5-325 MG PO TABS: 1.0000 | ORAL_TABLET | ORAL | 0 refills | Status: AC | PRN

## 2021-11-25 MED ORDER — POLYETHYLENE GLYCOL 3350 17 G PO PACK: 17.0000 g | PACK | Freq: Every day | ORAL | Status: DC | PRN

## 2021-11-25 NOTE — Discharge Summary (Signed)
Physician Discharge Summary   Patient: Sara Rodriguez MRN: [id number] DOB: 06-01-1933  Admit date:     11/21/2021  Discharge date: 11/25/21  Discharge Physician: Charlynne Cousins   PCP: Wenda Low, MD   Recommendations at discharge:   Follow-up with orthopedic surgery in 2 to 4 weeks. Check a CBC and a basic metabolic panel in 1 week.  Discharge Diagnoses: Principal Problem:   Closed left hip fracture, initial encounter ([place]) Active Problems:   Right upper lobe pneumonia   Acute postoperative anemia due to expected blood loss   Essential hypertension   Factor V Leiden ([place])   History of pulmonary embolism   S/P cholecystectomy   Normocytic anemia  Resolved Problems:   * No resolved hospital problems. *   Hospital Course: 88 year old with past medical history of PE, factor V [place] on Eliquis, essential hypertension recent cholecystectomy 7 days ago who had a mechanical fall at home started complaining of left hip pain was found to have a left hip comminuted intertrochanteric fracture orthopedic surgery was consulted and she is status post surgical intervention on 11/23/2021 status post intramedullary fixation of the left femur  Assessment and Plan: * Closed left hip fracture, initial encounter [facility])- (present on admission) Status post surgical intervention on 11/23/2021 Eliquis was held on admission the resume 2 days post surgical procedure. Physical therapy evaluated the patient, she will need to go to skilled nursing facility collapse facility.  She was discharged in stable condition   Right upper lobe pneumonia- (present on admission) Chest x-ray showed possible developing right upper lobe infiltrate. She was started on IV Rocephin and azithromycin, which was transitioned to Augmentin and azithromycin which she will continue for 5 additional days an outpatient. Her culture data remain negative till date she defervesced.   Acute postoperative anemia due to  expected blood loss 6.8 on admission status post 2 units of packed red blood cells. Have reviewed his CBC this morning is 8.6 No signs of obvious bleeding   Essential hypertension- (present on admission) Antihypertensive medications were held on admission her blood pressure started to trend up she will resume them as an outpatient   Factor V Leiden ([place])- (present on admission) Eliquis was held now restarted   History of pulmonary embolism- (present on admission) Heparin discontinued started back on Eliquis.  Normocytic anemia Hemoglobin this morning is 9 status post 2 units of packed red blood cells continue to monitor hemoglobin intermittently. I have reviewed her CBC, recheck tomorrow morning.  S/P cholecystectomy Doing well from cholecystectomy standpoint.           Consultants: Orthopedic surgery Procedures performed: Total hip replacement Disposition: Skilled nursing facility Diet recommendation:  Discharge Diet Orders (From admission, onward)     Start     Ordered   11/25/21 0000  Diet - low sodium heart healthy        11/25/21 1001           Carb modified diet  DISCHARGE MEDICATION: Allergies as of 11/25/2021   No Known Allergies      Medication List     TAKE these medications    acetaminophen 500 MG tablet Commonly known as: TYLENOL Take 500-1,000 mg by mouth every 6 (six) hours as needed for mild pain or headache.   albuterol 108 (90 Base) MCG/ACT inhaler Commonly known as: VENTOLIN HFA Inhale 2 puffs into the lungs every 4 (four) hours as needed for wheezing or shortness of breath.   amLODipine 5 MG tablet  Commonly known as: NORVASC Take 1 tablet (5 mg total) by mouth daily. What changed: when to take this   amoxicillin-clavulanate 875-125 MG tablet Commonly known as: AUGMENTIN Take 1 tablet by mouth every 12 (twelve) hours for 5 days.   apixaban 5 MG Tabs tablet Commonly known as: ELIQUIS Take 5 mg by mouth 2 (two) times  daily.   atorvastatin 10 MG tablet Commonly known as: LIPITOR Take 10 mg by mouth at bedtime.   azithromycin 500 MG tablet Commonly known as: Zithromax Take 1 tablet (500 mg total) by mouth daily for 3 days. Take 1 tablet daily for 3 days.   docusate sodium 100 MG capsule Commonly known as: COLACE Take 1 capsule (100 mg total) by mouth 2 (two) times daily.   ferrous sulfate 324 MG Tbec Take 324 mg by mouth daily with breakfast.   Fish Oil 1000 MG Caps Take 1,000 mg by mouth daily with breakfast.   hydrochlorothiazide 25 MG tablet Commonly known as: HYDRODIURIL Take 25 mg by mouth daily.   methocarbamol 500 MG tablet Commonly known as: ROBAXIN Take 1 tablet (500 mg total) by mouth every 6 (six) hours as needed for up to 5 days for muscle spasms.   olmesartan 40 MG tablet Commonly known as: BENICAR Take 40 mg by mouth daily.   omeprazole 40 MG capsule Commonly known as: PRILOSEC Take 40 mg by mouth daily before breakfast.   ondansetron 4 MG tablet Commonly known as: ZOFRAN Take 1 tablet (4 mg total) by mouth every 8 (eight) hours as needed for nausea or vomiting.   oxyCODONE-acetaminophen 5-325 MG tablet Commonly known as: PERCOCET/ROXICET Take 1-2 tablets by mouth every 4 (four) hours as needed for up to 5 days for severe pain.   polyethylene glycol powder 17 GM/SCOOP powder Commonly known as: GLYCOLAX/MIRALAX Take 8.5 g by mouth daily as needed for mild constipation (to mixed into 6-8 ounces of water before drinking).   tetrahydrozoline-zinc 0.05-0.25 % ophthalmic solution Commonly known as: VISINE-AC Place 2 drops into both eyes daily as needed (dry eyes).   traMADol 50 MG tablet Commonly known as: ULTRAM Take 50 mg by mouth every 6 (six) hours as needed (for pain).   Vitamin D3 Super Strength 50 MCG (2000 UT) Caps Generic drug: Cholecalciferol Take 2,000 Units by mouth daily.        Contact information for follow-up providers     Swinteck, Aaron Edelman, MD.  Schedule an appointment as soon as possible for a visit in 2 week(s).   Specialty: Orthopedic Surgery Why: For wound re-check, For suture removal Contact information: [address] [place] [address] [phone]              Contact information for after-discharge care     Destination     [facility]erred SNF .   Service: Skilled Nursing Contact information: [place] [phone]                     Discharge Exam: Danley Danker Weights   11/21/21 2238 11/22/21 0311  Weight: 74.8 kg 74.8 kg     Condition at discharge: good  The results of significant diagnostics from this hospitalization (including imaging, microbiology, ancillary and laboratory) are listed below for reference.   Imaging Studies: DG Chest 1 View  Result Date: 11/21/2021 CLINICAL DATA:  Fall, left hip pain. EXAM: CHEST  1 VIEW COMPARISON:  10/21/2020. FINDINGS: The heart is enlarged and mediastinal  contours are within normal limits. Atherosclerotic calcification of the aorta is noted. A retrocardiac opacity is noted likely reflecting a moderate hiatal hernia. Mild airspace disease is present in the right upper lobe. No effusion or pneumothorax. No acute osseous abnormality. IMPRESSION: 1. Mild airspace disease in the right upper lobe, possible developing infiltrate. 2. Cardiomegaly. 3. Moderate hiatal hernia. Electronically Signed   By: Brett Fairy M.D.   On: 11/21/2021 23:39   US Abdomen Complete  Result Date: 11/02/2021 CLINICAL DATA:  Abdominal discomfort. EXAM: ABDOMEN ULTRASOUND COMPLETE COMPARISON:  CT abdomen and pelvis 10/26/2020 FINDINGS: Gallbladder: Gallstones are present measuring up to 9 mm. There is no gallbladder wall thickening or pericholecystic fluid. No sonographic Murphy sign noted by sonographer. Common bile duct: Diameter: 5 mm Liver: There are 2 solid-appearing isoechoic nodular densities. One  is located in the inferolateral right lobe of the liver measuring 2.2 x 1.6 x 2.1 cm. The other is located near the gallbladder fossa measuring 3.2 x 2.6 x 2.3 cm. Portal vein is patent on color Doppler imaging with normal direction of blood flow towards the liver. IVC: No abnormality visualized. Pancreas: Visualized portion unremarkable. Spleen: Size and appearance within normal limits. Right Kidney: Length: 9.2 cm. Echogenicity within normal limits. No mass or hydronephrosis visualized. Left Kidney: Length: 10.2 cm. Echogenicity within normal limits. No mass or hydronephrosis visualized. Abdominal aorta: Mild aneurysmal dilatation measuring up to 3.2 cm, new from prior. Other findings: None. IMPRESSION: 1. There are 2 new isoechoic liver lesions, indeterminate, measuring up to 3.2 cm. Metastatic disease is not excluded. This can be further evaluated with MRI or CT as clinically appropriate. 2. Cholelithiasis. 3. New 3.2 cm abdominal aortic aneurysm. Recommend follow-up every 3 years. Reference: J Am Coll Radiol 5409;81:191-478. Electronically Signed   By: Ronney Asters M.D.   On: 11/02/2021 18:36   Pelvis Portable  Result Date: 11/23/2021 CLINICAL DATA:  Post left IM nail. EXAM: PORTABLE PELVIS 1-2 VIEWS COMPARISON:  11/21/2021 FINDINGS: Interval postoperative changes with placement of internal fixation of an inter trochanteric left hip fracture using a short intramedullary rod using distal locking screw and proximal compression screw. Residual displacement of the lesser trochanteric fragment with otherwise near anatomic position of the hip. No dislocation. Hardware appears intact. Vascular calcifications. Degenerative changes in the right hip. Soft tissue gas and skin clips over the left hip are consistent with recent surgery. IMPRESSION: Internal fixation of an inter trochanteric fracture of the left proximal femur. Electronically Signed   By: Lucienne Capers M.D.   On: 11/23/2021 19:48   DG Knee Left  Port  Result Date: 11/22/2021 CLINICAL DATA:  Proximal femur fracture EXAM: PORTABLE LEFT KNEE - 2 VIEW COMPARISON:  None. FINDINGS: No evidence of fracture or dislocation. No joint effusion. Moderate severe tricompartmental degenerative changes, most pronounced at the medial and patellofemoral compartments. Vascular calcifications. IMPRESSION: No acute osseous abnormality of the left knee. Electronically Signed   By: Yetta Glassman M.D.   On: 11/22/2021 09:25   DG C-Arm 1-60 Min-No Report  Result Date: 11/23/2021 Fluoroscopy was utilized by the requesting physician.  No radiographic interpretation.   DG HIP OPERATIVE UNILAT W OR W/O PELVIS LEFT  Result Date: 11/23/2021 CLINICAL DATA:  Closed left hip fracture EXAM: OPERATIVE left HIP (WITH PELVIS IF PERFORMED) 4 VIEWS TECHNIQUE: Fluoroscopic spot image(s) were submitted for interpretation post-operatively. COMPARISON:  11/21/2021 FINDINGS: Intraoperative fluoroscopy is utilized for surgical control purposes. Fluoroscopy time is recorded at 2 minutes 19 seconds. Dose is 29.396 mGy.  Dose area product is 12.921 Gy cm square. Four spot fluoroscopic images are obtained. Images obtained demonstrate initial inter trochanteric fracture of the left hip. Subsequent placement of internal fixation with a short intramedullary rod using distal locking screw and proximal compression screw. Near anatomic position is suggested. IMPRESSION: Intraoperative fluoroscopy utilized for surgical control purposes demonstrating internal fixation of an inter trochanteric fracture of the left hip. Electronically Signed   By: Lucienne Capers M.D.   On: 11/23/2021 19:46   DG Hip Unilat W or Wo Pelvis 2-3 Views Left  Result Date: 11/21/2021 CLINICAL DATA:  Fall, left hip pain. EXAM: DG HIP (WITH OR WITHOUT PELVIS) 2-3V LEFT COMPARISON:  None. FINDINGS: There is a comminuted intertrochanteric fracture with displaced fracture fragments and superior subluxation of the distal fracture  fragment. The fracture extends into the proximal femoral diaphysis. No dislocation is seen. Mild degenerative changes are present at the hips bilaterally. Vascular calcifications are noted in the soft tissues. IMPRESSION: Comminuted displaced intertrochanteric fracture extending into the proximal femoral diaphysis. Electronically Signed   By: Brett Fairy M.D.   On: 11/21/2021 23:36   US Abdomen Limited RUQ (LIVER/GB)  Result Date: 11/04/2021 CLINICAL DATA:  Abdominal pain. EXAM: ULTRASOUND ABDOMEN LIMITED RIGHT UPPER QUADRANT COMPARISON:  Ultrasound abdomen 11/02/2021. FINDINGS: Gallbladder: Multiple gallstones are identified measuring up to 9 mm. There is no gallbladder wall thickening. No sonographic Murphy sign noted by sonographer. Common bile duct: Diameter: 5 mm Liver: No focal lesion identified. Specifically, the 2 isoechoic liver lesions identified on recent comparison ultrasound are not seen on the current study. Mild increase in parenchymal echogenicity. Portal vein is patent on color Doppler imaging with normal direction of blood flow towards the liver. Other: None. IMPRESSION: 1. Cholelithiasis. No additional sonographic evidence for acute cholecystitis. 2. Mildly echogenic kidney may related to diffuse hepatocellular disease such as fatty infiltration. 3. Isoechoic liver lesion seen on prior ultrasound are not definitely seen on the current study. Electronically Signed   By: Ronney Asters M.D.   On: 11/04/2021 17:01    Microbiology: Results for orders placed or performed during the hospital encounter of 11/21/21  Resp Panel by RT-PCR (Flu A&B, Covid) Nasopharyngeal Swab     Status: None   Collection Time: 11/21/21 10:52 PM   Specimen: Nasopharyngeal Swab; Nasopharyngeal(NP) swabs in vial transport medium  Result Value Ref Range Status   SARS Coronavirus 2 by RT PCR NEGATIVE NEGATIVE Final    Comment: (NOTE) SARS-CoV-2 target nucleic acids are NOT DETECTED.  The SARS-CoV-2 RNA is  generally detectable in upper respiratory specimens during the acute phase of infection. The lowest concentration of SARS-CoV-2 viral copies this assay can detect is 138 copies/mL. A negative result does not preclude SARS-Cov-2 infection and should not be used as the sole basis for treatment or other patient management decisions. A negative result may occur with  improper specimen collection/handling, submission of specimen other than nasopharyngeal swab, presence of viral mutation(s) within the areas targeted by this assay, and inadequate number of viral copies(<138 copies/mL). A negative result must be combined with clinical observations, patient history, and epidemiological information. The expected result is Negative.  Fact Sheet for Patients:  EntrepreneurPulse.com.au  Fact Sheet for Healthcare Providers:  IncredibleEmployment.be  This test is no t yet approved or cleared by the [place] FDA and  has been authorized for detection and/or diagnosis of SARS-CoV-2 by FDA under an Emergency Use Authorization (EUA). This EUA will remain  in effect (meaning this test can be  used) for the duration of the COVID-19 declaration under Section 564(b)(1) of the Act, 21 U.S.C.section 360bbb-3(b)(1), unless the authorization is terminated  or revoked sooner.       Influenza A by PCR NEGATIVE NEGATIVE Final   Influenza B by PCR NEGATIVE NEGATIVE Final    Comment: (NOTE) The Xpert Xpress SARS-CoV-2/FLU/RSV plus assay is intended as an aid in the diagnosis of influenza from Nasopharyngeal swab specimens and should not be used as a sole basis for treatment. Nasal washings and aspirates are unacceptable for Xpert Xpress SARS-CoV-2/FLU/RSV testing.  Fact Sheet for Patients: EntrepreneurPulse.com.au  Fact Sheet for Healthcare Providers: IncredibleEmployment.be  This test is not yet approved or cleared by the [place] FDA and has been authorized for detection and/or diagnosis of SARS-CoV-2 by FDA under an Emergency Use Authorization (EUA). This EUA will remain in effect (meaning this test can be used) for the duration of the COVID-19 declaration under Section 564(b)(1) of the Act, 21 U.S.C. section 360bbb-3(b)(1), unless the authorization is terminated or revoked.  Performed at [facility], [place] [address], [place], [place] [address]   MRSA Next Gen by PCR, Nasal     Status: Abnormal   Collection Time: 11/22/21  2:45 AM   Specimen: Nasal Mucosa; Nasal Swab  Result Value Ref Range Status   MRSA by PCR Next Gen DETECTED (A) NOT DETECTED Final    Comment: (NOTE) The GeneXpert MRSA Assay (FDA approved for NASAL specimens only), is one component of a comprehensive MRSA colonization surveillance program. It is not intended to diagnose MRSA infection nor to guide or monitor treatment for MRSA infections. Test performance is not FDA approved in patients less than 30 years old. Performed at [facility], [place] [address] [place], [place] [address]     Labs: CBC: Recent Labs  Lab 11/21/21 2249 11/22/21 0209 11/23/21 0334 11/23/21 1734 11/24/21 0318 11/25/21 0312  WBC 18.5* 14.2* 18.2*  --  17.6* 18.2*  NEUTROABS 16.0*  --   --   --   --   --   HGB 10.1* 9.7* 7.4* 10.9* 9.5* 8.2*  HCT 32.7* 31.0* 24.3* 32.0* 29.5* 26.8*  MCV 83.2 83.8 84.7  --  87.8 91.5  PLT 250 241 202  --  175 378   Basic Metabolic Panel: Recent Labs  Lab 11/21/21 2249 11/22/21 0209 11/23/21 0334 11/23/21 1734 11/24/21 0318  NA 130* 130* 133* 140 131*  K 3.7 4.1 4.2 5.2* 4.1  CL 96* 95* 99  --  98  CO2 24 26 27   --  25  GLUCOSE 173* 169* 137*  --  146*  BUN 24* 26* 27*  --  23  CREATININE 1.12* 1.17* 1.19*  --  1.03*  CALCIUM 9.1 8.8* 8.2*  --  7.9*   Liver Function Tests: Recent Labs  Lab 11/23/21 0334 11/24/21 0318  AST 19 28  ALT 12 16  ALKPHOS 49 59   BILITOT 0.2* 0.5  PROT 5.3* 5.5*  ALBUMIN 2.4* 2.4*   CBG: No results for input(s): GLUCAP in the last 168 hours.  Discharge time spent: greater than 30 minutes.  Signed: Charlynne Cousins, MD [facility] 11/25/2021

## 2021-11-25 NOTE — TOC Transition Note (Signed)
Transition of Care [facility]) - CM/SW Discharge Note   Patient Details  Name: Sara Rodriguez MRN: [id number] Date of Birth: 1933-04-25  [facility] of [facility] CM/SW Contact:  Lennart Pall, LCSW Phone Number: 11/25/2021, 11:42 AM   Clinical Narrative:    Pt medically cleared for dc today to [facility] via [facility].  Pt and daughter agreeable.  RN to call report to [phone].   Final next level of care: Skilled Nursing Facility Barriers to Discharge: Barriers Resolved   Patient Goals and CMS Choice Patient states their goals for this hospitalization and ongoing recovery are:: return home      Discharge Placement   Existing PASRR number confirmed : 11/24/21          Patient chooses bed at: [place] [place] Patient to be transferred to facility by: [facility] Name of family member notified: daughter Patient and family notified of of transfer: 11/25/21  Discharge Plan and Services In-house Referral: Clinical Social Work              DME Arranged: N/A DME Agency: NA                  Social Determinants of Health ([place]) Interventions     Readmission Risk Interventions Readmission Risk Prevention Plan 11/22/2021  Transportation Screening Complete  PCP or Specialist Appt within 5-7 Days Complete  Home Care Screening Complete  Medication Review (RN CM) Complete  Some recent data might be hidden

## 2021-11-25 NOTE — Progress Notes (Signed)
Called facility and gave report to [facility], all questions answered. Pt not in acute distress, daughter at bedside updated. Pt discharged to SNF with belongings via [place].

## 2021-11-25 NOTE — Plan of Care (Signed)

## 2021-11-25 NOTE — Progress Notes (Signed)
° ° °  Subjective:  Patient reports pain as mild to moderate.  Denies N/V/CP/SOB. No c/o.  Objective:   VITALS:   Vitals:   11/24/21 2012 11/25/21 0459 11/25/21 0936 11/25/21 1441  BP: (!) 141/44 (!) 136/45 (!) 152/44 (!) 132/44  Pulse: 77 72 81 71  Resp: 16 16 16 16   Temp: 98.9 F (37.2 C) 98.5 F (36.9 C) 98.3 F (36.8 C) 98.9 F (37.2 C)  TempSrc: Oral Oral Oral Oral  SpO2: 98% 96% 100% 95%  Weight:      Height:        NAD ABD soft Sensation intact distally Intact pulses distally Dorsiflexion/Plantar flexion intact Incision: dressing C/D/I Compartment soft   Lab Results  Component Value Date   WBC 18.2 (H) 11/25/2021   HGB 8.2 (L) 11/25/2021   HCT 26.8 (L) 11/25/2021   MCV 91.5 11/25/2021   PLT 195 11/25/2021   BMET    Component Value Date/Time   NA 131 (L) 11/24/2021 0318   K 4.1 11/24/2021 0318   CL 98 11/24/2021 0318   CO2 25 11/24/2021 0318   GLUCOSE 146 (H) 11/24/2021 0318   BUN 23 11/24/2021 0318   CREATININE 1.03 (H) 11/24/2021 0318   CALCIUM 7.9 (L) 11/24/2021 0318   GFRNONAA 53 (L) 11/24/2021 0318     Assessment/Plan: 2 Days Post-Op   Principal Problem:   Closed left hip fracture, initial encounter ([place]) Active Problems:   Essential hypertension   Factor V Leiden ([facility])   S/P cholecystectomy   History of pulmonary embolism   Right upper lobe pneumonia   Acute postoperative anemia due to expected blood loss   Normocytic anemia   WBAT with walker DVT ppx:  apixaban , SCDs, TEDS PO pain control PT/OT Dispo: d/c planning   Hilton Cork Teodoro Jeffreys 11/25/2021, 2:53 PM   Rod Can, MD [phone] [facility] now [facility]   [facility] [address], [place] [address], [place], [place] [address] Phone: [phone] [email or web address]   [employer]

## 2021-11-25 NOTE — Care Management Important Message (Signed)
Important Message  Patient Details IM Letter placed in Patients room. Name: Sara Rodriguez MRN: [id number] Date of Birth: [birth year]   Medicare Important Message Given:  Yes     Kerin Salen 11/25/2021, 1:20 PM

## 2021-11-25 NOTE — Progress Notes (Signed)
OT Cancellation Note  Patient Details Name: Sara Rodriguez MRN: [id number] DOB: [birth year]   Cancelled Treatment:    Reason Eval/Treat Not Completed: Other (comment). Patient being discharged today and awaiting ambulance transfer to SNF. Will defer OT evaluation to facility.  Ginni Eichler L Indra Wolters 11/25/2021, 1:07 PM

## 2021-11-26 ENCOUNTER — Encounter (HOSPITAL_COMMUNITY): Payer: Self-pay | Admitting: Orthopedic Surgery

## 2021-11-27 DIAGNOSIS — S72142D Displaced intertrochanteric fracture of left femur, subsequent encounter for closed fracture with routine healing: Secondary | ICD-10-CM | POA: Diagnosis not present

## 2021-11-27 DIAGNOSIS — D62 Acute posthemorrhagic anemia: Secondary | ICD-10-CM | POA: Diagnosis not present

## 2021-11-27 DIAGNOSIS — D6852 Prothrombin gene mutation: Secondary | ICD-10-CM | POA: Diagnosis not present

## 2021-11-27 DIAGNOSIS — R197 Diarrhea, unspecified: Secondary | ICD-10-CM | POA: Diagnosis not present

## 2021-11-30 DIAGNOSIS — J189 Pneumonia, unspecified organism: Secondary | ICD-10-CM | POA: Diagnosis not present

## 2021-11-30 DIAGNOSIS — I10 Essential (primary) hypertension: Secondary | ICD-10-CM | POA: Diagnosis not present

## 2021-11-30 DIAGNOSIS — S7292XD Unspecified fracture of left femur, subsequent encounter for closed fracture with routine healing: Secondary | ICD-10-CM | POA: Diagnosis not present

## 2021-11-30 DIAGNOSIS — D649 Anemia, unspecified: Secondary | ICD-10-CM | POA: Diagnosis not present

## 2021-12-04 DIAGNOSIS — D72829 Elevated white blood cell count, unspecified: Secondary | ICD-10-CM | POA: Diagnosis not present

## 2021-12-04 DIAGNOSIS — I10 Essential (primary) hypertension: Secondary | ICD-10-CM | POA: Diagnosis not present

## 2021-12-04 DIAGNOSIS — R1032 Left lower quadrant pain: Secondary | ICD-10-CM | POA: Diagnosis not present

## 2021-12-06 ENCOUNTER — Other Ambulatory Visit: Payer: Self-pay | Admitting: Internal Medicine

## 2021-12-06 DIAGNOSIS — R1032 Left lower quadrant pain: Secondary | ICD-10-CM

## 2021-12-10 DIAGNOSIS — S72142D Displaced intertrochanteric fracture of left femur, subsequent encounter for closed fracture with routine healing: Secondary | ICD-10-CM | POA: Diagnosis not present

## 2021-12-13 DIAGNOSIS — M6281 Muscle weakness (generalized): Secondary | ICD-10-CM | POA: Diagnosis not present

## 2021-12-13 DIAGNOSIS — Z4789 Encounter for other orthopedic aftercare: Secondary | ICD-10-CM | POA: Diagnosis not present

## 2021-12-13 DIAGNOSIS — M25552 Pain in left hip: Secondary | ICD-10-CM | POA: Diagnosis not present

## 2021-12-13 DIAGNOSIS — R278 Other lack of coordination: Secondary | ICD-10-CM | POA: Diagnosis not present

## 2021-12-13 DIAGNOSIS — R2681 Unsteadiness on feet: Secondary | ICD-10-CM | POA: Diagnosis not present

## 2021-12-13 DIAGNOSIS — R279 Unspecified lack of coordination: Secondary | ICD-10-CM | POA: Diagnosis not present

## 2021-12-13 DIAGNOSIS — S72142D Displaced intertrochanteric fracture of left femur, subsequent encounter for closed fracture with routine healing: Secondary | ICD-10-CM | POA: Diagnosis not present

## 2021-12-14 DIAGNOSIS — R279 Unspecified lack of coordination: Secondary | ICD-10-CM | POA: Diagnosis not present

## 2021-12-14 DIAGNOSIS — Z4789 Encounter for other orthopedic aftercare: Secondary | ICD-10-CM | POA: Diagnosis not present

## 2021-12-14 DIAGNOSIS — R278 Other lack of coordination: Secondary | ICD-10-CM | POA: Diagnosis not present

## 2021-12-14 DIAGNOSIS — S72142D Displaced intertrochanteric fracture of left femur, subsequent encounter for closed fracture with routine healing: Secondary | ICD-10-CM | POA: Diagnosis not present

## 2021-12-14 DIAGNOSIS — M25552 Pain in left hip: Secondary | ICD-10-CM | POA: Diagnosis not present

## 2021-12-14 DIAGNOSIS — R2681 Unsteadiness on feet: Secondary | ICD-10-CM | POA: Diagnosis not present

## 2021-12-14 DIAGNOSIS — M6281 Muscle weakness (generalized): Secondary | ICD-10-CM | POA: Diagnosis not present

## 2021-12-15 DIAGNOSIS — Z9181 History of falling: Secondary | ICD-10-CM | POA: Diagnosis not present

## 2021-12-15 DIAGNOSIS — S72142D Displaced intertrochanteric fracture of left femur, subsequent encounter for closed fracture with routine healing: Secondary | ICD-10-CM | POA: Diagnosis not present

## 2021-12-15 DIAGNOSIS — R2681 Unsteadiness on feet: Secondary | ICD-10-CM | POA: Diagnosis not present

## 2021-12-15 DIAGNOSIS — M25552 Pain in left hip: Secondary | ICD-10-CM | POA: Diagnosis not present

## 2021-12-15 DIAGNOSIS — M6281 Muscle weakness (generalized): Secondary | ICD-10-CM | POA: Diagnosis not present

## 2021-12-15 DIAGNOSIS — Z4789 Encounter for other orthopedic aftercare: Secondary | ICD-10-CM | POA: Diagnosis not present

## 2021-12-15 DIAGNOSIS — R279 Unspecified lack of coordination: Secondary | ICD-10-CM | POA: Diagnosis not present

## 2021-12-16 DIAGNOSIS — M6281 Muscle weakness (generalized): Secondary | ICD-10-CM | POA: Diagnosis not present

## 2021-12-16 DIAGNOSIS — M25552 Pain in left hip: Secondary | ICD-10-CM | POA: Diagnosis not present

## 2021-12-16 DIAGNOSIS — Z9181 History of falling: Secondary | ICD-10-CM | POA: Diagnosis not present

## 2021-12-16 DIAGNOSIS — S72142D Displaced intertrochanteric fracture of left femur, subsequent encounter for closed fracture with routine healing: Secondary | ICD-10-CM | POA: Diagnosis not present

## 2021-12-16 DIAGNOSIS — R2681 Unsteadiness on feet: Secondary | ICD-10-CM | POA: Diagnosis not present

## 2021-12-16 DIAGNOSIS — Z4789 Encounter for other orthopedic aftercare: Secondary | ICD-10-CM | POA: Diagnosis not present

## 2021-12-16 DIAGNOSIS — R279 Unspecified lack of coordination: Secondary | ICD-10-CM | POA: Diagnosis not present

## 2021-12-17 ENCOUNTER — Ambulatory Visit
Admission: RE | Admit: 2021-12-17 | Discharge: 2021-12-17 | Disposition: A | Discharge status: Home / Self Care | Payer: Medicare Other | Source: Ambulatory Visit | Attending: Internal Medicine | Admitting: Internal Medicine

## 2021-12-17 DIAGNOSIS — Z9181 History of falling: Secondary | ICD-10-CM | POA: Diagnosis not present

## 2021-12-17 DIAGNOSIS — S72142D Displaced intertrochanteric fracture of left femur, subsequent encounter for closed fracture with routine healing: Secondary | ICD-10-CM | POA: Diagnosis not present

## 2021-12-17 DIAGNOSIS — K5790 Diverticulosis of intestine, part unspecified, without perforation or abscess without bleeding: Secondary | ICD-10-CM | POA: Diagnosis not present

## 2021-12-17 DIAGNOSIS — Z4789 Encounter for other orthopedic aftercare: Secondary | ICD-10-CM | POA: Diagnosis not present

## 2021-12-17 DIAGNOSIS — R112 Nausea with vomiting, unspecified: Secondary | ICD-10-CM | POA: Diagnosis not present

## 2021-12-17 DIAGNOSIS — M25552 Pain in left hip: Secondary | ICD-10-CM | POA: Diagnosis not present

## 2021-12-17 DIAGNOSIS — S72002A Fracture of unspecified part of neck of left femur, initial encounter for closed fracture: Secondary | ICD-10-CM | POA: Diagnosis not present

## 2021-12-17 DIAGNOSIS — R1032 Left lower quadrant pain: Secondary | ICD-10-CM

## 2021-12-17 DIAGNOSIS — K449 Diaphragmatic hernia without obstruction or gangrene: Secondary | ICD-10-CM | POA: Diagnosis not present

## 2021-12-17 DIAGNOSIS — M6281 Muscle weakness (generalized): Secondary | ICD-10-CM | POA: Diagnosis not present

## 2021-12-17 DIAGNOSIS — R2681 Unsteadiness on feet: Secondary | ICD-10-CM | POA: Diagnosis not present

## 2021-12-17 DIAGNOSIS — R279 Unspecified lack of coordination: Secondary | ICD-10-CM | POA: Diagnosis not present

## 2021-12-17 MED ORDER — IOPAMIDOL (ISOVUE-300) INJECTION 61%
100.0000 mL | Freq: Once | INTRAVENOUS | Status: AC | PRN
  Administered 2021-12-17: 100 mL via INTRAVENOUS

## 2021-12-19 DIAGNOSIS — E440 Moderate protein-calorie malnutrition: Secondary | ICD-10-CM | POA: Diagnosis not present

## 2021-12-19 DIAGNOSIS — S72142D Displaced intertrochanteric fracture of left femur, subsequent encounter for closed fracture with routine healing: Secondary | ICD-10-CM | POA: Diagnosis not present

## 2021-12-19 DIAGNOSIS — R1032 Left lower quadrant pain: Secondary | ICD-10-CM | POA: Diagnosis not present

## 2021-12-20 DIAGNOSIS — M25552 Pain in left hip: Secondary | ICD-10-CM | POA: Diagnosis not present

## 2021-12-20 DIAGNOSIS — M6281 Muscle weakness (generalized): Secondary | ICD-10-CM | POA: Diagnosis not present

## 2021-12-20 DIAGNOSIS — S72142D Displaced intertrochanteric fracture of left femur, subsequent encounter for closed fracture with routine healing: Secondary | ICD-10-CM | POA: Diagnosis not present

## 2021-12-20 DIAGNOSIS — R279 Unspecified lack of coordination: Secondary | ICD-10-CM | POA: Diagnosis not present

## 2021-12-20 DIAGNOSIS — R2681 Unsteadiness on feet: Secondary | ICD-10-CM | POA: Diagnosis not present

## 2021-12-20 DIAGNOSIS — Z9181 History of falling: Secondary | ICD-10-CM | POA: Diagnosis not present

## 2021-12-20 DIAGNOSIS — Z4789 Encounter for other orthopedic aftercare: Secondary | ICD-10-CM | POA: Diagnosis not present

## 2021-12-21 DIAGNOSIS — R2681 Unsteadiness on feet: Secondary | ICD-10-CM | POA: Diagnosis not present

## 2021-12-21 DIAGNOSIS — M6281 Muscle weakness (generalized): Secondary | ICD-10-CM | POA: Diagnosis not present

## 2021-12-21 DIAGNOSIS — S72142D Displaced intertrochanteric fracture of left femur, subsequent encounter for closed fracture with routine healing: Secondary | ICD-10-CM | POA: Diagnosis not present

## 2021-12-21 DIAGNOSIS — M25552 Pain in left hip: Secondary | ICD-10-CM | POA: Diagnosis not present

## 2021-12-21 DIAGNOSIS — Z9181 History of falling: Secondary | ICD-10-CM | POA: Diagnosis not present

## 2021-12-21 DIAGNOSIS — Z4789 Encounter for other orthopedic aftercare: Secondary | ICD-10-CM | POA: Diagnosis not present

## 2021-12-21 DIAGNOSIS — R279 Unspecified lack of coordination: Secondary | ICD-10-CM | POA: Diagnosis not present

## 2021-12-22 DIAGNOSIS — Z4789 Encounter for other orthopedic aftercare: Secondary | ICD-10-CM | POA: Diagnosis not present

## 2021-12-22 DIAGNOSIS — R279 Unspecified lack of coordination: Secondary | ICD-10-CM | POA: Diagnosis not present

## 2021-12-22 DIAGNOSIS — M6281 Muscle weakness (generalized): Secondary | ICD-10-CM | POA: Diagnosis not present

## 2021-12-22 DIAGNOSIS — Z9181 History of falling: Secondary | ICD-10-CM | POA: Diagnosis not present

## 2021-12-22 DIAGNOSIS — M25552 Pain in left hip: Secondary | ICD-10-CM | POA: Diagnosis not present

## 2021-12-22 DIAGNOSIS — R2681 Unsteadiness on feet: Secondary | ICD-10-CM | POA: Diagnosis not present

## 2021-12-22 DIAGNOSIS — S72142D Displaced intertrochanteric fracture of left femur, subsequent encounter for closed fracture with routine healing: Secondary | ICD-10-CM | POA: Diagnosis not present

## 2021-12-23 DIAGNOSIS — S72142D Displaced intertrochanteric fracture of left femur, subsequent encounter for closed fracture with routine healing: Secondary | ICD-10-CM | POA: Diagnosis not present

## 2021-12-23 DIAGNOSIS — Z4789 Encounter for other orthopedic aftercare: Secondary | ICD-10-CM | POA: Diagnosis not present

## 2021-12-23 DIAGNOSIS — R279 Unspecified lack of coordination: Secondary | ICD-10-CM | POA: Diagnosis not present

## 2021-12-23 DIAGNOSIS — M6281 Muscle weakness (generalized): Secondary | ICD-10-CM | POA: Diagnosis not present

## 2021-12-23 DIAGNOSIS — R2681 Unsteadiness on feet: Secondary | ICD-10-CM | POA: Diagnosis not present

## 2021-12-23 DIAGNOSIS — Z9181 History of falling: Secondary | ICD-10-CM | POA: Diagnosis not present

## 2021-12-23 DIAGNOSIS — M25552 Pain in left hip: Secondary | ICD-10-CM | POA: Diagnosis not present

## 2021-12-24 DIAGNOSIS — M25552 Pain in left hip: Secondary | ICD-10-CM | POA: Diagnosis not present

## 2021-12-24 DIAGNOSIS — Z9181 History of falling: Secondary | ICD-10-CM | POA: Diagnosis not present

## 2021-12-24 DIAGNOSIS — R279 Unspecified lack of coordination: Secondary | ICD-10-CM | POA: Diagnosis not present

## 2021-12-24 DIAGNOSIS — S72142D Displaced intertrochanteric fracture of left femur, subsequent encounter for closed fracture with routine healing: Secondary | ICD-10-CM | POA: Diagnosis not present

## 2021-12-24 DIAGNOSIS — Z4789 Encounter for other orthopedic aftercare: Secondary | ICD-10-CM | POA: Diagnosis not present

## 2021-12-24 DIAGNOSIS — M6281 Muscle weakness (generalized): Secondary | ICD-10-CM | POA: Diagnosis not present

## 2021-12-24 DIAGNOSIS — R2681 Unsteadiness on feet: Secondary | ICD-10-CM | POA: Diagnosis not present

## 2021-12-27 DIAGNOSIS — S72142D Displaced intertrochanteric fracture of left femur, subsequent encounter for closed fracture with routine healing: Secondary | ICD-10-CM | POA: Diagnosis not present

## 2021-12-27 DIAGNOSIS — E119 Type 2 diabetes mellitus without complications: Secondary | ICD-10-CM | POA: Diagnosis not present

## 2021-12-27 DIAGNOSIS — I252 Old myocardial infarction: Secondary | ICD-10-CM | POA: Diagnosis not present

## 2021-12-27 DIAGNOSIS — R32 Unspecified urinary incontinence: Secondary | ICD-10-CM | POA: Diagnosis not present

## 2021-12-27 DIAGNOSIS — I480 Paroxysmal atrial fibrillation: Secondary | ICD-10-CM | POA: Diagnosis not present

## 2021-12-27 DIAGNOSIS — M1990 Unspecified osteoarthritis, unspecified site: Secondary | ICD-10-CM | POA: Diagnosis not present

## 2021-12-27 DIAGNOSIS — Z85828 Personal history of other malignant neoplasm of skin: Secondary | ICD-10-CM | POA: Diagnosis not present

## 2021-12-27 DIAGNOSIS — D6851 Activated protein C resistance: Secondary | ICD-10-CM | POA: Diagnosis not present

## 2021-12-27 DIAGNOSIS — D649 Anemia, unspecified: Secondary | ICD-10-CM | POA: Diagnosis not present

## 2021-12-27 DIAGNOSIS — R1310 Dysphagia, unspecified: Secondary | ICD-10-CM | POA: Diagnosis not present

## 2021-12-27 DIAGNOSIS — E440 Moderate protein-calorie malnutrition: Secondary | ICD-10-CM | POA: Diagnosis not present

## 2021-12-27 DIAGNOSIS — Z7901 Long term (current) use of anticoagulants: Secondary | ICD-10-CM | POA: Diagnosis not present

## 2021-12-27 DIAGNOSIS — E785 Hyperlipidemia, unspecified: Secondary | ICD-10-CM | POA: Diagnosis not present

## 2021-12-27 DIAGNOSIS — K219 Gastro-esophageal reflux disease without esophagitis: Secondary | ICD-10-CM | POA: Diagnosis not present

## 2021-12-27 DIAGNOSIS — Z90710 Acquired absence of both cervix and uterus: Secondary | ICD-10-CM | POA: Diagnosis not present

## 2021-12-27 DIAGNOSIS — I10 Essential (primary) hypertension: Secondary | ICD-10-CM | POA: Diagnosis not present

## 2022-01-05 DIAGNOSIS — Z9049 Acquired absence of other specified parts of digestive tract: Secondary | ICD-10-CM | POA: Diagnosis not present

## 2022-01-05 DIAGNOSIS — I700 Atherosclerosis of aorta: Secondary | ICD-10-CM | POA: Diagnosis not present

## 2022-01-05 DIAGNOSIS — I10 Essential (primary) hypertension: Secondary | ICD-10-CM | POA: Diagnosis not present

## 2022-01-05 DIAGNOSIS — Z96649 Presence of unspecified artificial hip joint: Secondary | ICD-10-CM | POA: Diagnosis not present

## 2022-01-07 DIAGNOSIS — S72142D Displaced intertrochanteric fracture of left femur, subsequent encounter for closed fracture with routine healing: Secondary | ICD-10-CM | POA: Diagnosis not present

## 2022-01-23 ENCOUNTER — Ambulatory Visit (HOSPITAL_COMMUNITY)
Admission: EM | Admit: 2022-01-23 | Discharge: 2022-01-23 | Disposition: A | Discharge status: Home / Self Care | Payer: Medicare Other | Attending: Nurse Practitioner | Admitting: Nurse Practitioner

## 2022-01-23 ENCOUNTER — Ambulatory Visit (INDEPENDENT_AMBULATORY_CARE_PROVIDER_SITE_OTHER): Payer: Medicare Other

## 2022-01-23 ENCOUNTER — Other Ambulatory Visit: Payer: Self-pay

## 2022-01-23 ENCOUNTER — Encounter (HOSPITAL_COMMUNITY): Payer: Self-pay | Admitting: *Deleted

## 2022-01-23 DIAGNOSIS — J029 Acute pharyngitis, unspecified: Secondary | ICD-10-CM | POA: Diagnosis not present

## 2022-01-23 DIAGNOSIS — J42 Unspecified chronic bronchitis: Secondary | ICD-10-CM

## 2022-01-23 DIAGNOSIS — Z8709 Personal history of other diseases of the respiratory system: Secondary | ICD-10-CM | POA: Diagnosis not present

## 2022-01-23 DIAGNOSIS — R059 Cough, unspecified: Secondary | ICD-10-CM | POA: Diagnosis not present

## 2022-01-23 DIAGNOSIS — J209 Acute bronchitis, unspecified: Secondary | ICD-10-CM

## 2022-01-23 DIAGNOSIS — K449 Diaphragmatic hernia without obstruction or gangrene: Secondary | ICD-10-CM | POA: Diagnosis not present

## 2022-01-23 DIAGNOSIS — I517 Cardiomegaly: Secondary | ICD-10-CM | POA: Diagnosis not present

## 2022-01-23 MED ORDER — PROMETHAZINE-DM 6.25-15 MG/5ML PO SYRP: 2.5000 mL | ORAL_SOLUTION | Freq: Two times a day (BID) | ORAL | 0 refills | Status: DC | PRN

## 2022-01-23 MED ORDER — BENZONATATE 100 MG PO CAPS: 100.0000 mg | ORAL_CAPSULE | Freq: Two times a day (BID) | ORAL | 0 refills | Status: DC | PRN

## 2022-01-23 MED ORDER — PREDNISONE 20 MG PO TABS: 40.0000 mg | ORAL_TABLET | Freq: Every day | ORAL | 0 refills | Status: AC

## 2022-01-23 NOTE — ED Provider Notes (Signed)
?[place] ? ? ? ?CSN: [id number] ?Arrival date & time: 01/23/22  1502 ? ? ?  ? ?History   ?Chief Complaint ?Chief Complaint  ?Patient presents with  ? Cough  ? Sore Throat  ? ? ?HPI ?Sara Rodriguez is a 88 y.o. female.  ? ?Patient presents with caregiver for cough and sore throat on the right side for the past 3 days.  She is coughing up thick clear mucus.  Patient reports worsening shortness of breath with activity as well.  Denies nausea, vomiting, abdominal pain, change in appetite, fevers, body aches, and chills. She is eating and drinking normally. ? ?She was recently discharged from a skilled nursing facility after having a total hip replacement.  ? ? ?Past Medical History:  ?Diagnosis Date  ? Cancer [facility])   ? skin cancer  ? Dyspnea   ? Factor V Leiden ([place])   ? pulmonary embolism 2011  ? GERD (gastroesophageal reflux disease)   ? History of hiatal hernia   ? Hyperlipidemia   ? Hypertension   ? Pneumonia 2020  ? Primary localized osteoarthritis of left knee   ? Pulmonary embolism ([place]) 2011  ? ? ?Patient Active Problem List  ? Diagnosis Date Noted  ? Acute postoperative anemia due to expected blood loss 11/24/2021  ? Normocytic anemia 11/24/2021  ? History of pulmonary embolism 11/22/2021  ? Right upper lobe pneumonia 11/22/2021  ? Closed left hip fracture, initial encounter ([place]) 11/22/2021  ? S/P cholecystectomy 11/16/2021  ? Primary localized osteoarthritis of left knee   ? Hypertension   ? Factor V Leiden ([place])   ? Essential hypertension 03/25/2016  ? Hyperlipidemia 03/25/2016  ? Dyspnea on exertion 03/25/2016  ? PULMONARY EMBOLISM 01/14/2010  ? PLEURAL EFFUSION, RIGHT 01/14/2010  ? MASS, LUNG 01/14/2010  ? Pulmonary embolism ([place]) 10/17/2009  ? ? ?Past Surgical History:  ?Procedure Laterality Date  ? ABDOMINAL HYSTERECTOMY  1973  ? APPENDECTOMY    ? age 19  ? CATARACT EXTRACTION Left 2018  ? CHOLECYSTECTOMY  11/2021  ? Dr Kae Heller  ? COLONOSCOPY    ? FEMUR IM NAIL Left 11/23/2021  ? Procedure:  INTRAMEDULLARY (IM) NAIL FEMORAL;  Surgeon: Rod Can, MD;  Location: [facility];  Service: Orthopedics;  Laterality: Left;  ? KNEE ARTHROSCOPY Left 2004  ? RETINAL LASER PROCEDURE    ? [place]  2011  ? ? ?OB History   ?No obstetric history on file. ?  ? ? ? ?Home Medications   ? ?Prior to Admission medications   ?Medication Sig Start Date End Date Taking? Authorizing Provider  ?benzonatate (TESSALON) 100 MG capsule Take 1 capsule (100 mg total) by mouth 2 (two) times daily as needed for cough. Do not take while driving or operating heavy machinery 01/23/22  Yes Eulogio Bear, NP  ?predniSONE (DELTASONE) 20 MG tablet Take 2 tablets (40 mg total) by mouth daily with breakfast for 5 days. 01/23/22 01/28/22 Yes Eulogio Bear, NP  ?promethazine-dextromethorphan (PROMETHAZINE-DM) 6.25-15 MG/5ML syrup Take 2.5 mLs by mouth 2 (two) times daily as needed for cough. 01/23/22  Yes Eulogio Bear, NP  ?acetaminophen (TYLENOL) 500 MG tablet Take 500-1,000 mg by mouth every 6 (six) hours as needed for mild pain or headache.    [provider]  ?albuterol (VENTOLIN HFA) 108 (90 Base) MCG/ACT inhaler Inhale 2 puffs into the lungs every 4 (four) hours as needed for wheezing or shortness of breath.    [provider]  ?  amLODipine (NORVASC) 5 MG tablet Take 1 tablet (5 mg total) by mouth daily. ?Patient taking differently: Take 5 mg by mouth 2 (two) times daily. 07/14/16 11/22/21  Lorretta Harp, MD  ?apixaban (ELIQUIS) 5 MG TABS tablet Take 5 mg by mouth 2 (two) times daily.    [provider]  ?atorvastatin (LIPITOR) 10 MG tablet Take 10 mg by mouth at bedtime. 01/22/16   [provider]  ?docusate sodium (COLACE) 100 MG capsule Take 1 capsule (100 mg total) by mouth 2 (two) times daily. 11/25/21   Charlynne Cousins, MD  ?ferrous sulfate 324 MG TBEC Take 324 mg by mouth daily with breakfast.    [provider]  ?hydrochlorothiazide (HYDRODIURIL) 25 MG tablet  Take 25 mg by mouth daily. 06/06/20   [provider]  ?olmesartan (BENICAR) 40 MG tablet Take 40 mg by mouth daily.    [provider]  ?Omega-3 Fatty Acids (FISH OIL) 1000 MG CAPS Take 1,000 mg by mouth daily with breakfast.    [provider]  ?omeprazole (PRILOSEC) 40 MG capsule Take 40 mg by mouth daily before breakfast.    [provider]  ?ondansetron (ZOFRAN) 4 MG tablet Take 1 tablet (4 mg total) by mouth every 8 (eight) hours as needed for nausea or vomiting. 4/85/46   Prince Rome, PA-C  ?polyethylene glycol powder (GLYCOLAX/MIRALAX) 17 GM/SCOOP powder Take 8.5 g by mouth daily as needed for mild constipation (to mixed into 6-8 ounces of water before drinking).    [provider]  ?tetrahydrozoline-zinc (VISINE-AC) 0.05-0.25 % ophthalmic solution Place 2 drops into both eyes daily as needed (dry eyes).    [provider]  ?traMADol (ULTRAM) 50 MG tablet Take 50 mg by mouth every 6 (six) hours as needed (for pain).    [provider]  ?VITAMIN D3 SUPER STRENGTH 50 MCG (2000 UT) CAPS Take 2,000 Units by mouth daily.    [provider]  ? ? ?Family History ?Family History  ?Problem Relation Age of Onset  ? Heart disease Mother   ?     heart surgery  ? Parkinson's disease Father   ? Diabetes Sister   ? Diabetes Brother   ? Stroke Brother   ? Arrhythmia Daughter   ?     afib  ? Breast cancer Neg Hx   ? ? ?Social History ?Social History  ? ?Tobacco Use  ? Smoking status: Never  ? Smokeless tobacco: Never  ?Vaping Use  ? Vaping Use: Never used  ?Substance Use Topics  ? Alcohol use: No  ? Drug use: No  ? ? ? ?Allergies   ?Patient has no known allergies. ? ? ?Review of Systems ?Review of Systems ?Per HPI ? ?Physical Exam ?Triage Vital Signs ?ED Triage Vitals  ?Enc Vitals Group  ?   BP 01/23/22 1604 (!) 188/72  ?   Pulse Rate 01/23/22 1604 70  ?   Resp 01/23/22 1604 20  ?   Temp 01/23/22 1604 99.5 ?F (37.5 ?C)  ?   Temp src --   ?   SpO2  01/23/22 1604 92 %  ?   Weight --   ?   Height --   ?   Head Circumference --   ?   Peak Flow --   ?   Pain Score 01/23/22 1602 0  ?   Pain Loc --   ?   Pain Edu? --   ?   Excl. in [place]? --   ? ?  No data found. ? ?Updated Vital Signs ?BP (!) 188/72   Pulse 70   Temp 99.5 ?F (37.5 ?C)   Resp 20   SpO2 92%  ? ?Visual Acuity ?Right Eye Distance:   ?Left Eye Distance:   ?Bilateral Distance:   ? ?Right Eye Near:   ?Left Eye Near:    ?Bilateral Near:    ? ?Physical Exam ?Vitals and nursing note reviewed.  ?Constitutional:   ?   General: She is not in acute distress. ?   Appearance: She is well-developed. She is not toxic-appearing.  ?HENT:  ?   Head: Normocephalic and atraumatic.  ?   Nose: Nose normal. No congestion.  ?   Mouth/Throat:  ?   Mouth: Mucous membranes are moist.  ?   Pharynx: Oropharynx is clear.  ?Cardiovascular:  ?   Rate and Rhythm: Normal rate.  ?Pulmonary:  ?   Effort: Pulmonary effort is normal. No respiratory distress.  ?   Breath sounds: No stridor. Rales present. No wheezing or rhonchi.  ?Musculoskeletal:  ?   Cervical back: Normal range of motion.  ?   Right lower leg: No edema.  ?   Left lower leg: Edema (reports this is baseline) present.  ?Lymphadenopathy:  ?   Cervical: No cervical adenopathy.  ?Skin: ?   General: Skin is warm and dry.  ?   Capillary Refill: Capillary refill takes less than 2 seconds.  ?   Coloration: Skin is not jaundiced or pale.  ?   Findings: No erythema.  ?Neurological:  ?   Mental Status: She is alert and oriented to person, place, and time.  ?   Motor: No weakness.  ?   Gait: Gait abnormal (walks with walker).  ?Psychiatric:     ?   Behavior: Behavior is cooperative.  ? ? ? ?UC Treatments / Results  ?Labs ?(all labs ordered are listed, but only abnormal results are displayed) ?Labs Reviewed - No data to display ? ?EKG ? ? ?Radiology ?DG Chest 2 View ? ?Result Date: 01/23/2022 ?CLINICAL DATA:  3 day history of cough and sore throat. EXAM: CHEST - 2 VIEW COMPARISON:   11/21/2021 FINDINGS: The cardio pericardial silhouette is enlarged. Lungs are hyperexpanded. The lungs are clear without focal pneumonia, edema, pneumothorax or pleural effusion. Interstitial markings are diffu

## 2022-01-23 NOTE — Discharge Instructions (Signed)
Chest x-ray today shows your lungs are slightly hyperinflated, suggesting possible chronic bronchitis.  Please start the prednisone 40 mg daily for 5 days to help with inflammation around your lungs.  You can also use albuterol inhaler/rescue inhaler that you have at home to help with your breathing and cough ? ?You can use a Tessalon pearl twice daily to help with your cough as well as promethazine-dextromethorphan every 12 hours as needed.  Do not take these medicines at the same time. ? ?Some things that can make you feel better are: ?- Increased rest ?- Increasing fluid with water/sugar free electrolytes ?- Acetaminophen and ibuprofen as needed for fever/pain.  ?- Salt water gargling, chloraseptic spray and throat lozenges ?- OTC guaifenesin (Mucinex).  ?- Saline sinus flushes or a neti pot.  ?- Humidifying the air. ? ?If your symptoms are not improved later this week, please follow up with your primary care provider. ?

## 2022-01-23 NOTE — ED Triage Notes (Signed)
Pt reports she now has a cough and sore throat for past 3 days.  ?

## 2022-02-01 DIAGNOSIS — Z7901 Long term (current) use of anticoagulants: Secondary | ICD-10-CM | POA: Diagnosis not present

## 2022-02-01 DIAGNOSIS — D6851 Activated protein C resistance: Secondary | ICD-10-CM | POA: Diagnosis not present

## 2022-02-01 DIAGNOSIS — M1990 Unspecified osteoarthritis, unspecified site: Secondary | ICD-10-CM | POA: Diagnosis not present

## 2022-02-01 DIAGNOSIS — R1310 Dysphagia, unspecified: Secondary | ICD-10-CM | POA: Diagnosis not present

## 2022-02-01 DIAGNOSIS — S72142D Displaced intertrochanteric fracture of left femur, subsequent encounter for closed fracture with routine healing: Secondary | ICD-10-CM | POA: Diagnosis not present

## 2022-02-01 DIAGNOSIS — K219 Gastro-esophageal reflux disease without esophagitis: Secondary | ICD-10-CM | POA: Diagnosis not present

## 2022-02-01 DIAGNOSIS — I252 Old myocardial infarction: Secondary | ICD-10-CM | POA: Diagnosis not present

## 2022-02-01 DIAGNOSIS — R32 Unspecified urinary incontinence: Secondary | ICD-10-CM | POA: Diagnosis not present

## 2022-02-01 DIAGNOSIS — D649 Anemia, unspecified: Secondary | ICD-10-CM | POA: Diagnosis not present

## 2022-02-01 DIAGNOSIS — I10 Essential (primary) hypertension: Secondary | ICD-10-CM | POA: Diagnosis not present

## 2022-02-01 DIAGNOSIS — E440 Moderate protein-calorie malnutrition: Secondary | ICD-10-CM | POA: Diagnosis not present

## 2022-02-01 DIAGNOSIS — Z85828 Personal history of other malignant neoplasm of skin: Secondary | ICD-10-CM | POA: Diagnosis not present

## 2022-02-01 DIAGNOSIS — Z90710 Acquired absence of both cervix and uterus: Secondary | ICD-10-CM | POA: Diagnosis not present

## 2022-02-01 DIAGNOSIS — E119 Type 2 diabetes mellitus without complications: Secondary | ICD-10-CM | POA: Diagnosis not present

## 2022-02-01 DIAGNOSIS — E785 Hyperlipidemia, unspecified: Secondary | ICD-10-CM | POA: Diagnosis not present

## 2022-02-01 DIAGNOSIS — I480 Paroxysmal atrial fibrillation: Secondary | ICD-10-CM | POA: Diagnosis not present

## 2022-02-18 DIAGNOSIS — S72142D Displaced intertrochanteric fracture of left femur, subsequent encounter for closed fracture with routine healing: Secondary | ICD-10-CM | POA: Diagnosis not present

## 2022-02-25 DIAGNOSIS — I4891 Unspecified atrial fibrillation: Secondary | ICD-10-CM | POA: Diagnosis not present

## 2022-02-25 DIAGNOSIS — I499 Cardiac arrhythmia, unspecified: Secondary | ICD-10-CM | POA: Diagnosis not present

## 2022-02-25 DIAGNOSIS — I10 Essential (primary) hypertension: Secondary | ICD-10-CM | POA: Diagnosis not present

## 2022-03-01 NOTE — Progress Notes (Signed)
Cardiology Office Note:    Date:  03/03/2022   ID:  Sara Rodriguez, DOB [birth year], MRN [id number]  PCP:  Wenda Low, MD   [place] Providers Cardiologist:  Lenna Sciara, MD Referring MD: Wenda Low, MD   Chief Complaint/Reason for Referral: Atrial fibrillation  ASSESSMENT:    1. PAF (paroxysmal atrial fibrillation) ([place])   2. Factor V Leiden ([place])   3. Coronary artery calcification seen on CAT scan   4. Aortic atherosclerosis ([place])   5. Pure hypercholesterolemia   6. Stage 3 chronic kidney disease, unspecified whether stage 3a or 3b CKD ([place])     PLAN:    In order of problems listed above: 1.  Paroxysmal atrial fibrillation: We will obtain an echocardiogram to evaluate further.  Continue Eliquis.  Continue Coreg as the patient is adequately rate controlled.  Given her advanced age I think a rate control strategy is appropriate.  Follow-up 9 months. 2.  Factor V Leiden: Continue Eliquis. 3.  Coronary artery calcification.  Continue Eliquis in lieu of aspirin and atorvastatin with tight blood pressure control. 4.  Aortic atherosclerosis: Continue Eliquis in lieu of aspirin and atorvastatin with tight blood pressure control. 5.  Hyperlipidemia: Continue atorvastatin is being managed by the patient's primary care provider. 6.  Stage III chronic kidney disease: Continue tight blood pressure control.      Dispo:  Return in about 9 years (around 03/04/2031).      Medication Adjustments/Labs and Tests Ordered: Current medicines are reviewed at length with the patient today.  Concerns regarding medicines are outlined above.  The following changes have been made:  no change   Labs/tests ordered: Orders Placed This Encounter  Procedures   EKG 12-Lead   ECHOCARDIOGRAM COMPLETE    Medication Changes: No orders of the defined types were placed in this encounter.    Current medicines are reviewed at length with the patient today.  The patient does not have  concerns regarding medicines.   History of Present Illness:    FOCUSED PROBLEM LIST:   1.  Factor V Leiden with pulmonary embolism 2011 on Eliquis 2.  Hyperlipidemia 3.  Hypertension 4.  Aortic atherosclerosis on CT scan abdomen pelvis 2023 5.  Coronary artery calcification seen on chest CT 2011 6.  Frailty 7.  Paroxysmal atrial fibrillation with a CV2 score 4  The patient is a 88 y.o. female with the indicated medical history here for new onset atrial fibrillation.  Patient was noted to be in atrial fibrillation at her primary care provider's office.  She is already on chronic anticoagulation due to Factor V Leiden.  The patient did suffer a fall a few months ago around the time of her gallbladder surgery.  Since then she has had no falls or near falls.  She is relatively asymptomatic.  She does get short of breath with more than moderate exertion.  She denies any presyncope or syncope.  She has had no severe bleeding while on Eliquis.  She has required no emergency room visits or hospitalizations.  She does not feel symptomatic palpitations.         Current Medications: Current Meds  Medication Sig   acetaminophen (TYLENOL) 500 MG tablet Take 500-1,000 mg by mouth every 6 (six) hours as needed for mild pain or headache.   albuterol (VENTOLIN HFA) 108 (90 Base) MCG/ACT inhaler Inhale 2 puffs into the lungs every 4 (four) hours as needed for wheezing or shortness of breath.   apixaban (ELIQUIS) 5 MG  TABS tablet Take 5 mg by mouth 2 (two) times daily.   atorvastatin (LIPITOR) 10 MG tablet Take 10 mg by mouth at bedtime.   docusate sodium (COLACE) 100 MG capsule Take 1 capsule (100 mg total) by mouth 2 (two) times daily.   ferrous sulfate 324 MG TBEC Take 324 mg by mouth daily with breakfast.   losartan (COZAAR) 50 MG tablet 1 tablet   Omega-3 Fatty Acids (FISH OIL) 1000 MG CAPS Take 1,000 mg by mouth daily with breakfast.   omeprazole (PRILOSEC) 40 MG capsule Take 40 mg by mouth daily  before breakfast.   polyethylene glycol powder (GLYCOLAX/MIRALAX) 17 GM/SCOOP powder Take 8.5 g by mouth daily as needed for mild constipation (to mixed into 6-8 ounces of water before drinking).   tetrahydrozoline-zinc (VISINE-AC) 0.05-0.25 % ophthalmic solution Place 2 drops into both eyes daily as needed (dry eyes).   traMADol (ULTRAM) 50 MG tablet Take 50 mg by mouth every 6 (six) hours as needed (for pain).   VITAMIN D3 SUPER STRENGTH 50 MCG (2000 UT) CAPS Take 2,000 Units by mouth daily.     Allergies:    Patient has no known allergies.   Social History:   Social History   Tobacco Use   Smoking status: Never   Smokeless tobacco: Never  Vaping Use   Vaping Use: Never used  Substance Use Topics   Alcohol use: No   Drug use: No     Family Hx: Family History  Problem Relation Age of Onset   Heart disease Mother        heart surgery   Parkinson's disease Father    Diabetes Sister    Diabetes Brother    Stroke Brother    Arrhythmia Daughter        afib   Breast cancer Neg Hx      Review of Systems:   Please see the history of present illness.    All other systems reviewed and are negative.     EKGs/Labs/Other Test Reviewed:    EKG:  EKG performed February 2023 that I personally reviewed demonstrates atrial fibrillation; EKG performed today that I personally reviewed demonstrates rate controlled atrial fibrillation.  Prior CV studies: TTE 2017 with mildly calcified aortic valve with normal ejection fraction, moderate prolapse of tricuspid valve with moderate regurgitation  Lexiscan stress test 2017 low risk study   Other studies Reviewed: Review of the additional studies/records demonstrates: CT abdomen pelvis 2023 with aortic atherosclerosis, CT chest 2001 with coronary artery calcification  Recent Labs: 11/24/2021: ALT 16; BUN 23; Creatinine, Ser 1.03; Potassium 4.1; Sodium 131 11/25/2021: Hemoglobin 8.2; Platelets 195   Recent Lipid Panel No results found  for: CHOL, TRIG, HDL, LDLCALC, LDLDIRECT  Risk Assessment/Calculations:     CHA2DS2-VASc Score = 4   This indicates a 4.8% annual risk of stroke. The patient's score is based upon: CHF History: 0 HTN History: 1 Diabetes History: 0 Stroke History: 0 Vascular Disease History: 0 Age Score: 2 Gender Score: 1         Physical Exam:    VS:  BP 120/74   Pulse 76   Ht '5\' 3"'$  (1.6 m)   Wt 165 lb 9.6 oz (75.1 kg)   SpO2 97%   BMI 29.33 kg/m    Wt Readings from Last 3 Encounters:  03/03/22 165 lb 9.6 oz (75.1 kg)  11/22/21 164 lb 14.5 oz (74.8 kg)  11/16/21 165 lb 2 oz (74.9 kg)    GENERAL:  No  apparent distress, Aox3, frail HEENT:  No carotid bruits, +2 carotid impulses, no scleral icterus CAR: Irregular RR no murmurs, gallops, rubs, or thrills RES:  Clear to auscultation bilaterally ABD:  Soft, nontender, nondistended, positive bowel sounds x 4 VASC:  +2 radial pulses, +2 carotid pulses, palpable pedal pulses NEURO:  CN 2-12 grossly intact; motor and sensory grossly intact PSYCH:  No active depression or anxiety EXT:  No edema, ecchymosis, or cyanosis  Signed, Early Osmond, MD  03/03/2022 1:14 PM    [facility] [place], [place], [place]  [address] Phone: [phone]; Fax: [phone]   Note:  This document was prepared using Dragon voice recognition software and may include unintentional dictation errors.

## 2022-03-03 ENCOUNTER — Ambulatory Visit: Payer: Medicare Other | Admitting: Internal Medicine

## 2022-03-03 ENCOUNTER — Encounter: Payer: Self-pay | Admitting: Internal Medicine

## 2022-03-03 VITALS — BP 120/74 | HR 76 | Ht 63.0 in | Wt 165.6 lb

## 2022-03-03 DIAGNOSIS — N183 Chronic kidney disease, stage 3 unspecified: Secondary | ICD-10-CM

## 2022-03-03 DIAGNOSIS — D6851 Activated protein C resistance: Secondary | ICD-10-CM | POA: Diagnosis not present

## 2022-03-03 DIAGNOSIS — I700 Atherosclerosis of aorta: Secondary | ICD-10-CM

## 2022-03-03 DIAGNOSIS — I2510 Atherosclerotic heart disease of native coronary artery without angina pectoris: Secondary | ICD-10-CM | POA: Diagnosis not present

## 2022-03-03 DIAGNOSIS — I480 Paroxysmal atrial fibrillation: Secondary | ICD-10-CM

## 2022-03-03 DIAGNOSIS — E7800 Pure hypercholesterolemia, unspecified: Secondary | ICD-10-CM

## 2022-03-03 NOTE — Patient Instructions (Signed)
Medication Instructions:  NO CHANGES *If you need a refill on your cardiac medications before your next appointment, please call your pharmacy*   Lab Work: NONE If you have labs (blood work) drawn today and your tests are completely normal, you will receive your results only by: [facility] (if you have MyChart) OR A paper copy in the mail If you have any lab test that is abnormal or we need to change your treatment, we will call you to review the results.   Testing/Procedures: Your physician has requested that you have an echocardiogram. Echocardiography is a painless test that uses sound waves to create images of your heart. It provides your doctor with information about the size and shape of your heart and how well your heart's chambers and valves are working. This procedure takes approximately one hour. There are no restrictions for this procedure.    Follow-Up: At [facility], you and your health needs are our priority.  As part of our continuing mission to provide you with exceptional heart care, we have created designated Provider Care Teams.  These Care Teams include your primary Cardiologist (physician) and Advanced Practice Providers (APPs -  Physician Assistants and Nurse Practitioners) who all work together to provide you with the care you need, when you need it.  We recommend signing up for the patient portal called "MyChart".  Sign up information is provided on this After Visit Summary.  MyChart is used to connect with patients for Virtual Visits (Telemedicine).  Patients are able to view lab/test results, encounter notes, upcoming appointments, etc.  Non-urgent messages can be sent to your provider as well.   To learn more about what you can do with MyChart, go to [email or web address].    Your next appointment:    [place]  The format for your next appointment:      Important Information About Sugar

## 2022-03-25 ENCOUNTER — Ambulatory Visit (HOSPITAL_COMMUNITY): Payer: Medicare Other | Attending: Internal Medicine

## 2022-03-25 DIAGNOSIS — I480 Paroxysmal atrial fibrillation: Secondary | ICD-10-CM | POA: Diagnosis not present

## 2022-03-25 LAB — ECHOCARDIOGRAM COMPLETE: S' Lateral: 2.8 cm

## 2022-03-30 DIAGNOSIS — I10 Essential (primary) hypertension: Secondary | ICD-10-CM | POA: Diagnosis not present

## 2022-03-30 DIAGNOSIS — D6869 Other thrombophilia: Secondary | ICD-10-CM | POA: Diagnosis not present

## 2022-03-30 DIAGNOSIS — N1831 Chronic kidney disease, stage 3a: Secondary | ICD-10-CM | POA: Diagnosis not present

## 2022-03-30 DIAGNOSIS — I4891 Unspecified atrial fibrillation: Secondary | ICD-10-CM | POA: Diagnosis not present

## 2022-04-04 ENCOUNTER — Telehealth: Payer: Self-pay | Admitting: Internal Medicine

## 2022-04-04 DIAGNOSIS — Z01812 Encounter for preprocedural laboratory examination: Secondary | ICD-10-CM

## 2022-04-04 NOTE — Telephone Encounter (Signed)
Spoke with patient's daughter. TEE 04/14/22 with Dr. Johnsie Cancel 11:30 am Instructions sent through [place] and reviewed over the phone. Will come in for labs on 04/12/22.

## 2022-04-04 NOTE — Telephone Encounter (Signed)
Pt's daughter returning nurses call regarding results. [place]aser advise

## 2022-04-04 NOTE — Telephone Encounter (Signed)
-----   Message from Early Osmond, MD sent at 03/28/2022  7:38 AM EDT ----- She has a very leaky mitral valve.  This is likely contributing to her AF.  I would like her to get a TEE with a structural reader re: characterize MR.  Thanks!

## 2022-04-04 NOTE — Telephone Encounter (Signed)
Patient is returning call in regards to results. Req call back.

## 2022-04-05 ENCOUNTER — Other Ambulatory Visit: Payer: Self-pay | Admitting: Internal Medicine

## 2022-04-05 DIAGNOSIS — I340 Nonrheumatic mitral (valve) insufficiency: Secondary | ICD-10-CM

## 2022-04-07 ENCOUNTER — Encounter (HOSPITAL_COMMUNITY): Payer: Self-pay | Admitting: Cardiovascular Disease

## 2022-04-07 NOTE — H&P (View-Only) (Signed)
Attempted to obtain medical history via telephone, unable to reach at this time. HIPAA compliant voicemail message left requesting return call to pre surgical testing department. 

## 2022-04-07 NOTE — Progress Notes (Signed)
Attempted to obtain medical history via telephone, unable to reach at this time. HIPAA compliant voicemail message left requesting return call to pre surgical testing department. 

## 2022-04-12 ENCOUNTER — Other Ambulatory Visit: Payer: Medicare Other

## 2022-04-12 DIAGNOSIS — Z01812 Encounter for preprocedural laboratory examination: Secondary | ICD-10-CM

## 2022-04-12 LAB — BASIC METABOLIC PANEL
BUN/Creatinine Ratio: 24 (ref 12–28)
BUN: 24 mg/dL (ref 8–27)
CO2: 26 mmol/L (ref 20–29)
Calcium: 9.7 mg/dL (ref 8.7–10.3)
Chloride: 102 mmol/L (ref 96–106)
Creatinine, Ser: 1.02 mg/dL — ABNORMAL HIGH (ref 0.57–1.00)
Glucose: 93 mg/dL (ref 70–99)
Potassium: 5.2 mmol/L (ref 3.5–5.2)
Sodium: 139 mmol/L (ref 134–144)
eGFR: 54 mL/min/{1.73_m2} — ABNORMAL LOW (ref >59)

## 2022-04-12 LAB — CBC
Hematocrit: 37.3 % (ref 34.0–46.6)
Hemoglobin: 12 g/dL (ref 11.1–15.9)
MCH: 28.6 pg (ref 26.6–33.0)
MCHC: 32.2 g/dL (ref 31.5–35.7)
MCV: 89 fL (ref 79–97)
Platelets: 154 10*3/uL (ref 150–450)
RBC: 4.19 x10E6/uL (ref 3.77–5.28)
RDW: 13.1 % (ref 11.7–15.4)
WBC: 6.6 10*3/uL (ref 3.4–10.8)

## 2022-04-14 ENCOUNTER — Encounter (HOSPITAL_COMMUNITY)
Admission: EM | Disposition: A | Discharge status: Home / Self Care | Payer: Self-pay | Source: Ambulatory Visit | Attending: Cardiovascular Disease

## 2022-04-14 ENCOUNTER — Ambulatory Visit (HOSPITAL_COMMUNITY): Payer: Medicare Other | Admitting: Certified Registered Nurse Anesthetist

## 2022-04-14 ENCOUNTER — Ambulatory Visit (HOSPITAL_BASED_OUTPATIENT_CLINIC_OR_DEPARTMENT_OTHER): Payer: Medicare Other | Admitting: Certified Registered Nurse Anesthetist

## 2022-04-14 ENCOUNTER — Encounter (HOSPITAL_COMMUNITY): Payer: Self-pay | Admitting: Cardiovascular Disease

## 2022-04-14 ENCOUNTER — Ambulatory Visit (HOSPITAL_BASED_OUTPATIENT_CLINIC_OR_DEPARTMENT_OTHER): Payer: Medicare Other

## 2022-04-14 ENCOUNTER — Other Ambulatory Visit: Payer: Self-pay

## 2022-04-14 ENCOUNTER — Ambulatory Visit (HOSPITAL_COMMUNITY)
Admission: EM | Admit: 2022-04-14 | Discharge: 2022-04-14 | Disposition: A | Discharge status: Home / Self Care | Payer: Medicare Other | Source: Ambulatory Visit | Attending: Cardiovascular Disease | Admitting: Cardiovascular Disease

## 2022-04-14 ENCOUNTER — Telehealth: Payer: Self-pay | Admitting: Cardiology

## 2022-04-14 DIAGNOSIS — I340 Nonrheumatic mitral (valve) insufficiency: Secondary | ICD-10-CM | POA: Diagnosis not present

## 2022-04-14 DIAGNOSIS — Z7901 Long term (current) use of anticoagulants: Secondary | ICD-10-CM | POA: Diagnosis not present

## 2022-04-14 DIAGNOSIS — I081 Rheumatic disorders of both mitral and tricuspid valves: Secondary | ICD-10-CM | POA: Insufficient documentation

## 2022-04-14 DIAGNOSIS — E7800 Pure hypercholesterolemia, unspecified: Secondary | ICD-10-CM | POA: Insufficient documentation

## 2022-04-14 DIAGNOSIS — I700 Atherosclerosis of aorta: Secondary | ICD-10-CM | POA: Diagnosis not present

## 2022-04-14 DIAGNOSIS — D6851 Activated protein C resistance: Secondary | ICD-10-CM | POA: Insufficient documentation

## 2022-04-14 DIAGNOSIS — I10 Essential (primary) hypertension: Secondary | ICD-10-CM

## 2022-04-14 DIAGNOSIS — N183 Chronic kidney disease, stage 3 unspecified: Secondary | ICD-10-CM | POA: Diagnosis not present

## 2022-04-14 DIAGNOSIS — R54 Age-related physical debility: Secondary | ICD-10-CM | POA: Diagnosis not present

## 2022-04-14 DIAGNOSIS — I480 Paroxysmal atrial fibrillation: Secondary | ICD-10-CM | POA: Insufficient documentation

## 2022-04-14 DIAGNOSIS — I4891 Unspecified atrial fibrillation: Secondary | ICD-10-CM

## 2022-04-14 DIAGNOSIS — M1990 Unspecified osteoarthritis, unspecified site: Secondary | ICD-10-CM | POA: Diagnosis not present

## 2022-04-14 DIAGNOSIS — I129 Hypertensive chronic kidney disease with stage 1 through stage 4 chronic kidney disease, or unspecified chronic kidney disease: Secondary | ICD-10-CM | POA: Insufficient documentation

## 2022-04-14 HISTORY — PX: TEE WITHOUT CARDIOVERSION: SHX5443

## 2022-04-14 LAB — ECHO TEE
MV M vel: 4.03 m/s
MV Peak grad: 65 mmHg
Radius: 0.5 cm

## 2022-04-14 SURGERY — ECHOCARDIOGRAM, TRANSESOPHAGEAL
Anesthesia: Monitor Anesthesia Care

## 2022-04-14 MED ORDER — PROPOFOL 500 MG/50ML IV EMUL
INTRAVENOUS | Status: DC | PRN
  Administered 2022-04-14: 75 ug/kg/min via INTRAVENOUS

## 2022-04-14 MED ORDER — PROPOFOL 10 MG/ML IV BOLUS
INTRAVENOUS | Status: DC | PRN
  Administered 2022-04-14: 20 mg via INTRAVENOUS
  Administered 2022-04-14 (×3): 10 mg via INTRAVENOUS
  Administered 2022-04-14: 20 mg via INTRAVENOUS
  Administered 2022-04-14: 10 mg via INTRAVENOUS

## 2022-04-14 MED ORDER — SODIUM CHLORIDE 0.9 % IV SOLN: INTRAVENOUS | Status: DC

## 2022-04-14 MED ORDER — SODIUM CHLORIDE 0.9 % IV SOLN: INTRAVENOUS | Status: DC | PRN

## 2022-04-14 MED ORDER — LIDOCAINE 2% (20 MG/ML) 5 ML SYRINGE
INTRAMUSCULAR | Status: DC | PRN
  Administered 2022-04-14: 60 mg via INTRAVENOUS

## 2022-04-14 NOTE — Telephone Encounter (Signed)
Patient underwent TEE today for suspected severe MR however found to have moderate MR and not felt to be a MitraClip candidate at this time. TEE reviewed by Dr. Ali Lowe with plans to follow her in 6 months. This has been scheduled for 09/26/22 at 11am with Dr.Thukkani.   Kathyrn Drown NP-C Structural Heart Team  Pager: [phone] Phone: [phone]

## 2022-04-14 NOTE — Transfer of Care (Signed)
Immediate Anesthesia Transfer of Care Note  Patient: Mackenzy Grumbine  Procedure(s) Performed: TRANSESOPHAGEAL ECHOCARDIOGRAM (TEE)  Patient Location: Endoscopy Unit  Anesthesia Type:MAC  Level of Consciousness: drowsy  Airway & Oxygen Therapy: Patient Spontanous Breathing and Patient connected to nasal cannula oxygen  Post-op Assessment: Report given to RN and Post -op Vital signs reviewed and stable  Post vital signs: Reviewed and stable  Last Vitals:  Vitals Value Taken Time  BP 100/52 04/14/22 1126  Temp 36.2 C 04/14/22 1126  Pulse 81 04/14/22 1128  Resp 16 04/14/22 1128  SpO2 96 % 04/14/22 1128  Vitals shown include unvalidated device data.  Last Pain:  Vitals:   04/14/22 1126  TempSrc: Temporal  PainSc: 0-No pain         Complications: No notable events documented.

## 2022-04-14 NOTE — Anesthesia Procedure Notes (Signed)
Procedure Name: MAC Date/Time: 04/14/2022 10:54 AM  Performed by: Lowella Dell, CRNAPre-anesthesia Checklist: Patient identified, Emergency Drugs available, Suction available, Patient being monitored and Timeout performed Patient Re-evaluated:Patient Re-evaluated prior to induction Oxygen Delivery Method: Nasal cannula Airway Equipment and Method: Bite block Placement Confirmation: positive ETCO2 Dental Injury: Teeth and Oropharynx as per pre-operative assessment

## 2022-04-14 NOTE — Discharge Instructions (Signed)

## 2022-04-14 NOTE — Progress Notes (Signed)
  Echocardiogram Echocardiogram Transesophageal has been performed.  Darlina Sicilian M 04/14/2022, 12:11 PM

## 2022-04-14 NOTE — Anesthesia Preprocedure Evaluation (Signed)
Anesthesia Evaluation  Patient identified by MRN, date of birth, ID band Patient awake    Reviewed: Allergy & Precautions, H&P , NPO status , Patient's Chart, lab work & pertinent test results  Airway Mallampati: II   Neck ROM: full    Dental   Pulmonary shortness of breath,    breath sounds clear to auscultation       Cardiovascular hypertension, + dysrhythmias Atrial Fibrillation  Rhythm:irregular Rate:Normal     Neuro/Psych    GI/Hepatic GERD  ,  Endo/Other    Renal/GU      Musculoskeletal  (+) Arthritis ,   Abdominal   Peds  Hematology Factor V deficiency   Anesthesia Other Findings   Reproductive/Obstetrics                             Anesthesia Physical Anesthesia Plan  ASA: 3  Anesthesia Plan: MAC   Post-op Pain Management:    Induction: Intravenous  PONV Risk Score and Plan: 2 and Propofol infusion and Treatment may vary due to age or medical condition  Airway Management Planned: Nasal Cannula  Additional Equipment:   Intra-op Plan:   Post-operative Plan:   Informed Consent: I have reviewed the patients History and Physical, chart, labs and discussed the procedure including the risks, benefits and alternatives for the proposed anesthesia with the patient or authorized representative who has indicated his/her understanding and acceptance.     Dental advisory given  Plan Discussed with: CRNA, Anesthesiologist and Surgeon  Anesthesia Plan Comments:         Anesthesia Quick Evaluation

## 2022-04-14 NOTE — Interval H&P Note (Signed)
History and Physical Interval Note:  04/14/2022 11:37 AM  Sara Rodriguez  has presented today for surgery, with the diagnosis of Severe mitral regurgitation.  The various methods of treatment have been discussed with the patient and family. After consideration of risks, benefits and other options for treatment, the patient has consented to  Procedure(s): TRANSESOPHAGEAL ECHOCARDIOGRAM (TEE) (N/A) as a surgical intervention.  The patient's history has been reviewed, patient examined, no change in status, stable for surgery.  I have reviewed the patient's chart and labs.  Questions were answered to the patient's satisfaction.     Jenkins Rouge

## 2022-04-14 NOTE — CV Procedure (Signed)
TEE: Anesthesia:  Propofol Patient had significant secretions and some bronchospasm  Image quality despite multiple positions from laying flat on back To over rotated very poor and not likely adequate for Mitral clip procedure  More moderate appearing MR likely Atrial FMR with severe bi atrial enlargement 3D imaging did not show flail/prolapsing segment Central to anteriorly directed MR  PV;s with blunted systolic flow but patient in afib   AV sclerosis  Atrial septum not well seen and would be difficult to guide trans septal puncture  No effusion  Moderate TR  Overall given age, poor image quality and Atrial FMR do not think she is a good candidate for mitral clip   Jenkins Rouge MD [facility]

## 2022-04-15 ENCOUNTER — Encounter (HOSPITAL_COMMUNITY): Payer: Self-pay | Admitting: Cardiovascular Disease

## 2022-04-15 NOTE — Anesthesia Postprocedure Evaluation (Signed)
Anesthesia Post Note  Patient: Sara Rodriguez  Procedure(s) Performed: TRANSESOPHAGEAL ECHOCARDIOGRAM (TEE)     Patient location during evaluation: Endoscopy Anesthesia Type: MAC Level of consciousness: awake and alert Pain management: pain level controlled Vital Signs Assessment: post-procedure vital signs reviewed and stable Respiratory status: spontaneous breathing, nonlabored ventilation, respiratory function stable and patient connected to nasal cannula oxygen Cardiovascular status: stable and blood pressure returned to baseline Postop Assessment: no apparent nausea or vomiting Anesthetic complications: no   No notable events documented.  Last Vitals:  Vitals:   04/14/22 1136 04/14/22 1148  BP: (!) 111/59 127/72  Pulse: 84 85  Resp: 17 15  Temp:    SpO2: 100% 98%    Last Pain:  Vitals:   04/14/22 1148  TempSrc:   PainSc: 0-No pain                 Babbette Dalesandro S

## 2022-04-18 NOTE — Interval H&P Note (Signed)
History and Physical Interval Note:  04/18/2022 11:51 AM  Sara Rodriguez  has presented today for surgery, with the diagnosis of Severe mitral regurgitation.  The various methods of treatment have been discussed with the patient and family. After consideration of risks, benefits and other options for treatment, the patient has consented to  Procedure(s): TRANSESOPHAGEAL ECHOCARDIOGRAM (TEE) (N/A) as a surgical intervention.  The patient's history has been reviewed, patient examined, no change in status, stable for surgery.  I have reviewed the patient's chart and labs.  Questions were answered to the patient's satisfaction.     Jenkins Rouge

## 2022-04-18 NOTE — H&P (Signed)
Cardiology Office Note:     Date:  03/03/2022    ID:  Sara Rodriguez, DOB June 03, 1933, MRN [id number]   PCP:  Wenda Low, MD         [place] Providers Cardiologist:  Lenna Sciara, MD Referring MD: Wenda Low, MD    Chief Complaint/Reason for Referral: Atrial fibrillation   ASSESSMENT:     1. PAF (paroxysmal atrial fibrillation) ([place])   2. Factor V Leiden ([place])   3. Coronary artery calcification seen on CAT scan   4. Aortic atherosclerosis ([place])   5. Pure hypercholesterolemia   6. Stage 3 chronic kidney disease, unspecified whether stage 3a or 3b CKD ([place])    7.   Severe MR   PLAN:     TEE to further look at degree of MR and candidacy for mitral clip          Dispo:  F/U Dr Adolphus Birchwood post procedure       Objective  Medication Adjustments/Labs and Tests Ordered: Current medicines are reviewed at length with the patient today.  Concerns regarding medicines are outlined above.   The following changes have been made:  no change    Labs/tests ordered:    Orders Placed This Encounter  Procedures   EKG 12-Lead   ECHOCARDIOGRAM COMPLETE      Medication Changes: No orders of the defined types were placed in this encounter.       Current medicines are reviewed at length with the patient today.  The patient does not have concerns regarding medicines.     History of Present Illness:     FOCUSED PROBLEM LIST:   1.  Factor V Leiden with pulmonary embolism 2011 on Eliquis 2.  Hyperlipidemia 3.  Hypertension 4.  Aortic atherosclerosis on CT scan abdomen pelvis 2023 5.  Coronary artery calcification seen on chest CT 2011 6.  Frailty 7.  Paroxysmal atrial fibrillation with a CV2 score 4   The patient is a 88 y.o. female with the indicated medical history here for new onset atrial fibrillation.  Patient was noted to be in atrial fibrillation at her primary care provider's office.  She is already on chronic anticoagulation due to Factor V Leiden.  The  patient did suffer a fall a few months ago around the time of her gallbladder surgery.  Since then she has had no falls or near falls.  She is relatively asymptomatic.  She does get short of breath with more than moderate exertion.  She denies any presyncope or syncope.  She has had no severe bleeding while on Eliquis.  She has required no emergency room visits or hospitalizations.  She does not feel symptomatic palpitations.TTE done by Dr Adolphus Birchwood suggested severe MR ? Atrial FMR TEE ordered to further assess candidacy for mitral clip       Objective  Current Medications: Active Medications      Current Meds  Medication Sig   acetaminophen (TYLENOL) 500 MG tablet Take 500-1,000 mg by mouth every 6 (six) hours as needed for mild pain or headache.   albuterol (VENTOLIN HFA) 108 (90 Base) MCG/ACT inhaler Inhale 2 puffs into the lungs every 4 (four) hours as needed for wheezing or shortness of breath.   apixaban (ELIQUIS) 5 MG TABS tablet Take 5 mg by mouth 2 (two) times daily.   atorvastatin (LIPITOR) 10 MG tablet Take 10 mg by mouth at bedtime.   docusate sodium (COLACE) 100 MG capsule Take 1 capsule (100 mg total) by mouth  2 (two) times daily.   ferrous sulfate 324 MG TBEC Take 324 mg by mouth daily with breakfast.   losartan (COZAAR) 50 MG tablet 1 tablet   Omega-3 Fatty Acids (FISH OIL) 1000 MG CAPS Take 1,000 mg by mouth daily with breakfast.   omeprazole (PRILOSEC) 40 MG capsule Take 40 mg by mouth daily before breakfast.   polyethylene glycol powder (GLYCOLAX/MIRALAX) 17 GM/SCOOP powder Take 8.5 g by mouth daily as needed for mild constipation (to mixed into 6-8 ounces of water before drinking).   tetrahydrozoline-zinc (VISINE-AC) 0.05-0.25 % ophthalmic solution Place 2 drops into both eyes daily as needed (dry eyes).   traMADol (ULTRAM) 50 MG tablet Take 50 mg by mouth every 6 (six) hours as needed (for pain).   VITAMIN D3 SUPER STRENGTH 50 MCG (2000 UT) CAPS Take 2,000 Units by mouth  daily.        Allergies:    Patient has no known allergies.    Social History:   Social History        Tobacco Use   Smoking status: Never   Smokeless tobacco: Never  Vaping Use   Vaping Use: Never used  Substance Use Topics   Alcohol use: No   Drug use: No      Family Hx:      Family History  Problem Relation Age of Onset   Heart disease Mother          heart surgery   Parkinson's disease Father     Diabetes Sister     Diabetes Brother     Stroke Brother     Arrhythmia Daughter          afib   Breast cancer Neg Hx        Review of Systems:   Please see the history of present illness.    All other systems reviewed and are negative.         EKGs/Labs/Other Test Reviewed:     EKG:  EKG performed February 2023 that I personally reviewed demonstrates atrial fibrillation; EKG performed today that I personally reviewed demonstrates rate controlled atrial fibrillation.   Prior CV studies: TTE 2017 with mildly calcified aortic valve with normal ejection fraction, moderate prolapse of tricuspid valve with moderate regurgitation   Lexiscan stress test 2017 low risk study     Other studies Reviewed: Review of the additional studies/records demonstrates: CT abdomen pelvis 2023 with aortic atherosclerosis, CT chest 2001 with coronary artery calcification   Recent Labs: 11/24/2021: ALT 16; BUN 23; Creatinine, Ser 1.03; Potassium 4.1; Sodium 131 11/25/2021: Hemoglobin 8.2; Platelets 195    Recent Lipid Panel Labs (Brief)  No results found for: CHOL, TRIG, HDL, LDLCALC, LDLDIRECT     Risk Assessment/Calculations:       CHA2DS2-VASc Score = 4   This indicates a 4.8% annual risk of stroke. The patient's score is based upon: CHF History: 0 HTN History: 1 Diabetes History: 0 Stroke History: 0 Vascular Disease History: 0 Age Score: 2 Gender Score: 1           Physical Exam:     VS:  BP 120/74   Pulse 76   Ht '5\' 3"'$  (1.6 m)   Wt 165 lb 9.6 oz (75.1 kg)    SpO2 97%   BMI 29.33 kg/m       Wt Readings from Last 3 Encounters:  03/03/22 165 lb 9.6 oz (75.1 kg)  11/22/21 164 lb 14.5 oz (74.8 kg)  11/16/21 165 lb 2  oz (74.9 kg)    Affect appropriate Frail elderly female  HEENT: normal Neck supple with no adenopathy JVP normal no bruits no thyromegaly Lungs clear with no wheezing and good diaphragmatic motion Heart:  S1/S2 MR  murmur, no rub, gallop or click PMI normal Abdomen: benighn, BS positve, no tenderness, no AAA no bruit.  No HSM or HJR Distal pulses intact with no bruits No edema Neuro non-focal Skin warm and dry No muscular weakness   Jenkins Rouge MD [facility]

## 2022-05-20 DIAGNOSIS — M7061 Trochanteric bursitis, right hip: Secondary | ICD-10-CM | POA: Diagnosis not present

## 2022-05-20 DIAGNOSIS — M7062 Trochanteric bursitis, left hip: Secondary | ICD-10-CM | POA: Diagnosis not present

## 2022-05-20 DIAGNOSIS — S72142D Displaced intertrochanteric fracture of left femur, subsequent encounter for closed fracture with routine healing: Secondary | ICD-10-CM | POA: Diagnosis not present

## 2022-09-22 NOTE — Progress Notes (Signed)
Cardiology Office Note:    Date:  09/26/2022   ID:  Sara Rodriguez, DOB [birth year], MRN [id number]  PCP:  Sara Low, MD   [facility] Providers Cardiologist:  Sara Sciara, MD Referring MD: Sara Low, MD   Chief Complaint/Reason for Referral: Follow-up for atrial fibrillation and mitral regurgitation  ASSESSMENT:    1. PAF (paroxysmal atrial fibrillation) ([place])   2. Factor V Leiden ([name])   3. Coronary artery calcification seen on CAT scan   4. Aortic atherosclerosis ([name])   5. Hyperlipidemia LDL goal <70   6. Stage 3 chronic kidney disease, unspecified whether stage 3a or 3b CKD ([place])   7. Mitral valve insufficiency, unspecified etiology   8. Stage 3a chronic kidney disease ([name])   9. Primary hypertension      PLAN:    In order of problems listed above: 1.  Paroxysmal atrial fibrillation: Continue Eliquis and Coreg.  Creatinine and weight are okay for continue 5 mg dosing of Eliquis. 2.  Factor V Leiden: Continue Eliquis. 3.  Coronary artery calcification.  Continue Eliquis in lieu of aspirin and atorvastatin with tight blood pressure control. 4.  Aortic atherosclerosis: Continue Eliquis in lieu of aspirin and atorvastatin with tight blood pressure control. 5.  Hyperlipidemia: Continue atorvastatin; is being managed by the patient's primary care provider.  Her LDL in January of last year was 79.  Given her advanced age more liberal management of her cholesterol may be appropriate. 6.  Stage III chronic kidney disease: Continue tight blood pressure control. 7.  Mitral regurgitation: Her TEE demonstrated only moderate mitral regurgitation.  She has very difficult imaging windows.  If a repeat TEE is required to assess candidacy for mitral transcatheter edge-to-edge repair we should pursue general anesthesia with a TEE which has been associated with improved imaging windows however the threshold for complex mitral valve repair may be high given her advanced age and  frailty.  Will obtain an echocardiogram now to evaluate further 8.  CKD stage III: Continue losartan. 9.  Hypertension: Blood pressure is well-controlled on her current regimen.      Dispo:  Return in about 9 months (around 06/28/2023).      Medication Adjustments/Labs and Tests Ordered: Current medicines are reviewed at length with the patient today.  Concerns regarding medicines are outlined above.  The following changes have been made:  no change   Labs/tests ordered: Orders Placed This Encounter  Procedures   ECHOCARDIOGRAM COMPLETE    Medication Changes: No orders of the defined types were placed in this encounter.    Current medicines are reviewed at length with the patient today.  The patient does not have concerns regarding medicines.   History of Present Illness:    FOCUSED PROBLEM LIST:   1.  Factor V Leiden with pulmonary embolism 2011 on Eliquis 2.  Hyperlipidemia 3.  Hypertension 4.  Aortic atherosclerosis on CT scan abdomen pelvis 2023 5.  Coronary artery calcification seen on chest CT 2011 6.  Frailty; ambulates with a walker 7.  Paroxysmal atrial fibrillation with a CV2 score 4 8.  Moderate mitral regurgitation on TEE 2023 9.  Stage III CKD  May 2023 consultation: The patient is a 88 y.o. female with the indicated medical history here for new onset atrial fibrillation.  Patient was noted to be in atrial fibrillation at her primary care provider's office.  She is already on chronic anticoagulation due to Factor V Leiden.  The patient did suffer a fall a few months  ago around the time of her gallbladder surgery.  Since then she has had no falls or near falls.  She is relatively asymptomatic.  She does get short of breath with more than moderate exertion.  She denies any presyncope or syncope.  She has had no severe bleeding while on Eliquis.  She has required no emergency room visits or hospitalizations.  She does not feel symptomatic palpitations.  Plan:  Obtain echocardiogram and continue current therapy.  Today: In the interim the patient had an echocardiogram which demonstrated significant mitral regurgitation thought to be moderate to severe.  She underwent a TEE evaluation which showed only moderate mitral regurgitation.  Her shortness of breath is relatively unchanged.  This has been a chronic issue and was first noticed when she developed a pulmonary embolism.  She denies any presyncope, syncope, severe bleeding episodes, exertional angina, or any need for hospitalizations or emergency room visits for cardiac issues.  She feels relatively well when she is at rest.  She gets short of breath on more than moderate exertion.  This does not happen on a daily basis and she has some bad days as well as good days.  Because of shortness of breath issues that of manifest that she now does ambulate with a walker.           Current Medications: Current Meds  Medication Sig   acetaminophen (TYLENOL) 500 MG tablet Take 500-1,000 mg by mouth every 6 (six) hours as needed for mild pain or headache.   albuterol (VENTOLIN HFA) 108 (90 Base) MCG/ACT inhaler Inhale 2 puffs into the lungs every 4 (four) hours as needed for wheezing or shortness of breath.   apixaban (ELIQUIS) 5 MG TABS tablet Take 5 mg by mouth 2 (two) times daily.   atorvastatin (LIPITOR) 10 MG tablet Take 10 mg by mouth at bedtime.   carvedilol (COREG) 6.25 MG tablet Take 6.25 mg by mouth 2 (two) times daily with a meal.   Cholecalciferol (VITAMIN D3 SUPER STRENGTH PO) Take 1,000 Units by mouth daily.   ferrous sulfate 325 (65 FE) MG tablet Take 325 mg by mouth daily with breakfast.   losartan (COZAAR) 50 MG tablet Take 50 mg by mouth daily.   Omega-3 Fatty Acids (FISH OIL) 1000 MG CAPS Take 1,000 mg by mouth daily with breakfast.   omeprazole (PRILOSEC) 40 MG capsule Take 40 mg by mouth daily before breakfast.   polyethylene glycol powder (GLYCOLAX/MIRALAX) 17 GM/SCOOP powder Take 8.5 g  by mouth daily as needed for mild constipation (to mixed into 6-8 ounces of water before drinking).   promethazine-dextromethorphan (PROMETHAZINE-DM) 6.25-15 MG/5ML syrup Take 2.5 mLs by mouth 2 (two) times daily as needed for cough.   tetrahydrozoline-zinc (VISINE-AC) 0.05-0.25 % ophthalmic solution Apply 2 drops to eye daily as needed (dry eyes).     Allergies:    Patient has no known allergies.   Social History:   Social History   Tobacco Use   Smoking status: Never   Smokeless tobacco: Never  Vaping Use   Vaping Use: Never used  Substance Use Topics   Alcohol use: No   Drug use: No     Family Hx: Family History  Problem Relation Age of Onset   Heart disease Mother        heart surgery   Parkinson's disease Father    Diabetes Sister    Diabetes Brother    Stroke Brother    Arrhythmia Daughter  afib   Breast cancer Neg Hx      Review of Systems:   Please see the history of present illness.    All other systems reviewed and are negative.     EKGs/Labs/Other Test Reviewed:    EKG:  EKG performed February 2023 that I personally reviewed demonstrates atrial fibrillation; EKG performed today that I personally reviewed demonstrates rate controlled atrial fibrillation.  Prior CV studies:  TTE 2023  1. Left ventricular ejection fraction, by estimation, is 55 to 60%. The  left ventricle has normal function. The left ventricle has no regional  wall motion abnormalities. Left ventricular diastolic parameters are  indeterminate.   2. Right ventricular systolic function is mildly reduced. The right  ventricular size is normal. There is moderately elevated pulmonary artery  systolic pressure. The estimated right ventricular systolic pressure is  75.6 mmHg.   3. Left atrial size was severely dilated.   4. The mitral valve is degenerative. Severe mitral valve regurgitation  with evidence of splay artifact. No evidence of mitral stenosis.   5. Tricuspid valve  regurgitation is severe.   6. The aortic valve is grossly normal. There is mild calcification of the  aortic valve. There is moderate thickening of the aortic valve. Aortic  valve regurgitation is not visualized. Aortic valve  sclerosis/calcification is present, without any evidence   of aortic stenosis.   7. The inferior vena cava is dilated in size with >50% respiratory  variability, suggesting right atrial pressure of 8 mmHg.   TEE 2023 1. Given patients age, fucntional atrial MR with chronic afib and severe  bi atrial enlargment and poor image quality to guide procedure would not  recommend mitral clip.   2. Left ventricular ejection fraction, by estimation, is 60 to 65%. The  left ventricle has normal function. The left ventricle has no regional  wall motion abnormalities.   3. Right ventricular systolic function is normal. The right ventricular  size is normal.   4. Left atrial size was severely dilated. No left atrial/left atrial  appendage thrombus was detected.   5. Right atrial size was severely dilated.   6. Overall challenging images for valve Moderate central to anteriorly  directed Atrial FMR. No obvoius flail/prolapsing segments. PISA radius 0.5  PV systolic flow blunted but patient in chronic afib . The mitral valve is  abnormal. Moderate mitral valve  regurgitation. No evidence of mitral stenosis.   7. Tricuspid valve regurgitation is moderate.   8. The aortic valve is tricuspid. There is moderate calcification of the  aortic valve. There is moderate thickening of the aortic valve. Aortic  valve regurgitation is not visualized. Aortic valve  sclerosis/calcification is present, without any evidence  of aortic stenosis.   9. The inferior vena cava is normal in size with greater than 50%  respiratory variability, suggesting right atrial pressure of 3 mmHg.   Lexiscan stress test 2017 Rodriguez risk study   Other studies Reviewed: Review of the additional studies/records  demonstrates: CT abdomen pelvis 2023 with aortic atherosclerosis, CT chest 2001 with coronary artery calcification  Recent Labs: 11/24/2021: ALT 16 04/12/2022: BUN 24; Creatinine, Ser 1.02; Hemoglobin 12.0; Platelets 154; Potassium 5.2; Sodium 139   Recent Lipid Panel No results found for: "CHOL", "TRIG", "HDL", "LDLCALC", "LDLDIRECT"  Risk Assessment/Calculations:     CHA2DS2-VASc Score = 4   This indicates a 4.8% annual risk of stroke. The patient's score is based upon: CHF History: 0 HTN History: 1 Diabetes History: 0 Stroke History:  0 Vascular Disease History: 0 Age Score: 2 Gender Score: 1         Physical Exam:    VS:  BP 130/80   Pulse 87   Ht '5\' 3"'$  (1.6 m)   Wt 181 lb (82.1 kg)   SpO2 97%   BMI 32.06 kg/m    Wt Readings from Last 3 Encounters:  09/26/22 181 lb (82.1 kg)  04/14/22 162 lb (73.5 kg)  03/03/22 165 lb 9.6 oz (75.1 kg)    GENERAL:  No apparent distress, Aox3, frail HEENT:  No carotid bruits, +2 carotid impulses, no scleral icterus CAR: Irregular RR no murmurs, gallops, rubs, or thrills RES:  Clear to auscultation bilaterally ABD:  Soft, nontender, nondistended, positive bowel sounds x 4 VASC:  +2 radial pulses, +2 carotid pulses, palpable pedal pulses NEURO:  CN 2-12 grossly intact; motor and sensory grossly intact PSYCH:  No active depression or anxiety EXT: +1 edema worse on the left lower extremity without  ecchymosis, or cyanosis  Signed, Early Osmond, MD  09/26/2022 11:33 AM    [address], [place], [place]  [address] Phone: [phone]; Fax: [phone]   Note:  This document was prepared using Dragon voice recognition software and may include unintentional dictation errors.

## 2022-09-26 ENCOUNTER — Ambulatory Visit: Payer: Medicare Other | Attending: Internal Medicine | Admitting: Internal Medicine

## 2022-09-26 ENCOUNTER — Encounter: Payer: Self-pay | Admitting: Internal Medicine

## 2022-09-26 VITALS — BP 130/80 | HR 87 | Ht 63.0 in | Wt 181.0 lb

## 2022-09-26 DIAGNOSIS — I480 Paroxysmal atrial fibrillation: Secondary | ICD-10-CM | POA: Diagnosis not present

## 2022-09-26 DIAGNOSIS — I340 Nonrheumatic mitral (valve) insufficiency: Secondary | ICD-10-CM

## 2022-09-26 DIAGNOSIS — N1831 Chronic kidney disease, stage 3a: Secondary | ICD-10-CM

## 2022-09-26 DIAGNOSIS — I10 Essential (primary) hypertension: Secondary | ICD-10-CM

## 2022-09-26 DIAGNOSIS — N183 Chronic kidney disease, stage 3 unspecified: Secondary | ICD-10-CM

## 2022-09-26 DIAGNOSIS — D6851 Activated protein C resistance: Secondary | ICD-10-CM | POA: Diagnosis not present

## 2022-09-26 DIAGNOSIS — I2510 Atherosclerotic heart disease of native coronary artery without angina pectoris: Secondary | ICD-10-CM

## 2022-09-26 DIAGNOSIS — I700 Atherosclerosis of aorta: Secondary | ICD-10-CM

## 2022-09-26 DIAGNOSIS — E785 Hyperlipidemia, unspecified: Secondary | ICD-10-CM

## 2022-09-26 NOTE — Patient Instructions (Signed)
Medication Instructions:  Your physician recommends that you continue on your current medications as directed. Please refer to the Current Medication list given to you today.  *If you need a refill on your cardiac medications before your next appointment, please call your pharmacy*   Lab Work: None. If you have labs (blood work) drawn today and your tests are completely normal, you will receive your results only by: [place] (if you have MyChart) OR A paper copy in the mail If you have any lab test that is abnormal or we need to change your treatment, we will call you to review the results.   Testing/Procedures: Your physician has requested that you have an echocardiogram. Echocardiography is a painless test that uses sound waves to create images of your heart. It provides your doctor with information about the size and shape of your heart and how well your heart's chambers and valves are working. This procedure takes approximately one hour. There are no restrictions for this procedure. Please do NOT wear cologne, perfume, aftershave, or lotions (deodorant is allowed). Please arrive 15 minutes prior to your appointment time.    Follow-Up: At [facility], you and your health needs are our priority.  As part of our continuing mission to provide you with exceptional heart care, we have created designated Provider Care Teams.  These Care Teams include your primary Cardiologist (physician) and Advanced Practice Providers (APPs -  Physician Assistants and Nurse Practitioners) who all work together to provide you with the care you need, when you need it.  We recommend signing up for the patient portal called "MyChart".  Sign up information is provided on this After Visit Summary.  MyChart is used to connect with patients for Virtual Visits (Telemedicine).  Patients are able to view lab/test results, encounter notes, upcoming appointments, etc.  Non-urgent messages can be sent to  your provider as well.   To learn more about what you can do with MyChart, go to [email or web address].    Your next appointment:   9 month(s)  The format for your next appointment:   In Person  Provider:   Dr. Lenna Sciara, MD    Important Information About Sugar

## 2022-10-26 DIAGNOSIS — N1831 Chronic kidney disease, stage 3a: Secondary | ICD-10-CM | POA: Diagnosis not present

## 2022-10-26 DIAGNOSIS — I10 Essential (primary) hypertension: Secondary | ICD-10-CM | POA: Diagnosis not present

## 2022-10-26 DIAGNOSIS — R7303 Prediabetes: Secondary | ICD-10-CM | POA: Diagnosis not present

## 2022-10-26 DIAGNOSIS — Z0000 Encounter for general adult medical examination without abnormal findings: Secondary | ICD-10-CM | POA: Diagnosis not present

## 2022-10-26 DIAGNOSIS — E782 Mixed hyperlipidemia: Secondary | ICD-10-CM | POA: Diagnosis not present

## 2022-10-26 DIAGNOSIS — Z1331 Encounter for screening for depression: Secondary | ICD-10-CM | POA: Diagnosis not present

## 2022-10-26 DIAGNOSIS — I700 Atherosclerosis of aorta: Secondary | ICD-10-CM | POA: Diagnosis not present

## 2022-10-26 DIAGNOSIS — D696 Thrombocytopenia, unspecified: Secondary | ICD-10-CM | POA: Diagnosis not present

## 2022-11-01 ENCOUNTER — Other Ambulatory Visit (HOSPITAL_COMMUNITY): Payer: Medicare Other

## 2022-11-08 DIAGNOSIS — S72142D Displaced intertrochanteric fracture of left femur, subsequent encounter for closed fracture with routine healing: Secondary | ICD-10-CM | POA: Diagnosis not present

## 2022-11-08 DIAGNOSIS — M170 Bilateral primary osteoarthritis of knee: Secondary | ICD-10-CM | POA: Diagnosis not present

## 2022-11-29 ENCOUNTER — Ambulatory Visit (HOSPITAL_COMMUNITY): Payer: Medicare Other | Attending: Internal Medicine

## 2022-11-29 DIAGNOSIS — I340 Nonrheumatic mitral (valve) insufficiency: Secondary | ICD-10-CM | POA: Insufficient documentation

## 2022-11-29 LAB — ECHOCARDIOGRAM COMPLETE
Area-P 1/2: 4.93 cm2
MV M vel: 2.69 m/s
MV Peak grad: 28.9 mmHg
Radius: 0.5 cm
S' Lateral: 3 cm

## 2023-03-02 DIAGNOSIS — H2511 Age-related nuclear cataract, right eye: Secondary | ICD-10-CM | POA: Diagnosis not present

## 2023-03-02 DIAGNOSIS — H35373 Puckering of macula, bilateral: Secondary | ICD-10-CM | POA: Diagnosis not present

## 2023-03-02 DIAGNOSIS — H353131 Nonexudative age-related macular degeneration, bilateral, early dry stage: Secondary | ICD-10-CM | POA: Diagnosis not present

## 2023-03-02 DIAGNOSIS — H40013 Open angle with borderline findings, low risk, bilateral: Secondary | ICD-10-CM | POA: Diagnosis not present

## 2023-03-16 DIAGNOSIS — D225 Melanocytic nevi of trunk: Secondary | ICD-10-CM | POA: Diagnosis not present

## 2023-03-16 DIAGNOSIS — C44622 Squamous cell carcinoma of skin of right upper limb, including shoulder: Secondary | ICD-10-CM | POA: Diagnosis not present

## 2023-03-16 DIAGNOSIS — L814 Other melanin hyperpigmentation: Secondary | ICD-10-CM | POA: Diagnosis not present

## 2023-03-16 DIAGNOSIS — D492 Neoplasm of unspecified behavior of bone, soft tissue, and skin: Secondary | ICD-10-CM | POA: Diagnosis not present

## 2023-03-16 DIAGNOSIS — L821 Other seborrheic keratosis: Secondary | ICD-10-CM | POA: Diagnosis not present

## 2023-03-16 DIAGNOSIS — C44311 Basal cell carcinoma of skin of nose: Secondary | ICD-10-CM | POA: Diagnosis not present

## 2023-03-16 DIAGNOSIS — L853 Xerosis cutis: Secondary | ICD-10-CM | POA: Diagnosis not present

## 2023-03-17 DIAGNOSIS — H25811 Combined forms of age-related cataract, right eye: Secondary | ICD-10-CM | POA: Diagnosis not present

## 2023-04-25 DIAGNOSIS — H524 Presbyopia: Secondary | ICD-10-CM | POA: Diagnosis not present

## 2023-04-26 DIAGNOSIS — D682 Hereditary deficiency of other clotting factors: Secondary | ICD-10-CM | POA: Diagnosis not present

## 2023-04-26 DIAGNOSIS — I10 Essential (primary) hypertension: Secondary | ICD-10-CM | POA: Diagnosis not present

## 2023-04-26 DIAGNOSIS — D6869 Other thrombophilia: Secondary | ICD-10-CM | POA: Diagnosis not present

## 2023-04-26 DIAGNOSIS — D696 Thrombocytopenia, unspecified: Secondary | ICD-10-CM | POA: Diagnosis not present

## 2023-04-27 DIAGNOSIS — C44622 Squamous cell carcinoma of skin of right upper limb, including shoulder: Secondary | ICD-10-CM | POA: Diagnosis not present

## 2023-04-27 DIAGNOSIS — C44311 Basal cell carcinoma of skin of nose: Secondary | ICD-10-CM | POA: Diagnosis not present

## 2023-05-08 ENCOUNTER — Ambulatory Visit (HOSPITAL_COMMUNITY)
Admission: EM | Admit: 2023-05-08 | Discharge: 2023-05-08 | Disposition: A | Discharge status: Home / Self Care | Payer: Medicare Other | Attending: Family Medicine | Admitting: Family Medicine

## 2023-05-08 ENCOUNTER — Encounter (HOSPITAL_COMMUNITY): Payer: Self-pay

## 2023-05-08 DIAGNOSIS — B349 Viral infection, unspecified: Secondary | ICD-10-CM | POA: Insufficient documentation

## 2023-05-08 DIAGNOSIS — R531 Weakness: Secondary | ICD-10-CM | POA: Insufficient documentation

## 2023-05-08 LAB — CBC
HCT: 39.5 % (ref 36.0–46.0)
Hemoglobin: 12.4 g/dL (ref 12.0–15.0)
MCH: 30.7 pg (ref 26.0–34.0)
MCHC: 31.4 g/dL (ref 30.0–36.0)
MCV: 97.8 fL (ref 80.0–100.0)
Platelets: 176 10*3/uL (ref 150–400)
RBC: 4.04 MIL/uL (ref 3.87–5.11)
RDW: 13 % (ref 11.5–15.5)
WBC: 7.5 10*3/uL (ref 4.0–10.5)
nRBC: 0 % (ref 0.0–0.2)

## 2023-05-08 LAB — COMPREHENSIVE METABOLIC PANEL
ALT: 17 U/L (ref 0–44)
AST: 20 U/L (ref 15–41)
Albumin: 3.3 g/dL — ABNORMAL LOW (ref 3.5–5.0)
Alkaline Phosphatase: 66 U/L (ref 38–126)
Anion gap: 9 (ref 5–15)
BUN: 15 mg/dL (ref 8–23)
CO2: 25 mmol/L (ref 22–32)
Calcium: 9.4 mg/dL (ref 8.9–10.3)
Chloride: 102 mmol/L (ref 98–111)
Creatinine, Ser: 1.12 mg/dL — ABNORMAL HIGH (ref 0.44–1.00)
GFR, Estimated: 48 mL/min — ABNORMAL LOW (ref >60)
Glucose, Bld: 84 mg/dL (ref 70–99)
Potassium: 4.4 mmol/L (ref 3.5–5.1)
Sodium: 136 mmol/L (ref 135–145)
Total Bilirubin: 0.3 mg/dL (ref 0.3–1.2)
Total Protein: 7.1 g/dL (ref 6.5–8.1)

## 2023-05-08 MED ORDER — ONDANSETRON 4 MG PO TBDP: 4.0000 mg | ORAL_TABLET | Freq: Three times a day (TID) | ORAL | 0 refills | Status: AC | PRN

## 2023-05-08 NOTE — Discharge Instructions (Signed)
Ondansetron dissolved in the mouth every 8 hours as needed for nausea or vomiting. Clear liquids(water, gatorade/pedialyte, ginger ale/sprite, chicken broth/soup) and bland things(crackers/toast, rice, potato, bananas) to eat. Avoid acidic foods like lemon/lime/orange/tomato, and avoid greasy/spicy foods.  We have drawn blood to check your blood counts, sodium and potassium, and kidney and liver numbers.  Staff will notify if anything is significantly abnormal.  If you worsen anyway, please present to the emergency room  Otherwise please follow-up with your primary care

## 2023-05-08 NOTE — ED Triage Notes (Signed)
Patient here today with c/o weakness, fatigue, and nausea X 4 days. She felty dizzy this, morning. She had diarrhea the first day this started.

## 2023-05-08 NOTE — ED Provider Notes (Signed)
[facility]    CSN: [id number] Arrival date & time: 05/08/23  1018      History   Chief Complaint Chief Complaint  Patient presents with   Weakness    HPI Sara Rodriguez is a 89 y.o. female.    Weakness Here for nausea and weakness.  Symptoms began about 4 days ago.  She did feel dizzy this morning, but that is improved.  She had diarrhea the first day, but has not had any vomiting.  She states she feels worse today as far as the nausea.  She is able to eat and get in fluids orally.  No chest pain and no shortness of breath.  No cough or congestion.  Her daughter did help her do a COVID test 2 days ago and it was negative.    She states she has dysuria sometimes, but is not having any increase in her symptoms.  She does have a history of atrial fibrillation. Past Medical History:  Diagnosis Date   A-fib [facility])    Anticoagulant long-term use    Aortic atherosclerosis (HCC)    Cancer (HCC)    skin cancer   Chronic kidney disease, stage 3 (HCC)    Diastolic dysfunction    DVT (deep venous thrombosis) (HCC)    Dyspnea    Elevated glucose    Factor V deficiency (HCC)    Factor V Leiden (HCC)    pulmonary embolism 2011   GERD (gastroesophageal reflux disease)    Hearing loss    History of hiatal hernia    Hypercholesterolemia    Hyperlipidemia    Hypertension    Loss of appetite    Obesity    On apixaban therapy    Osteoarthritis    Osteoporosis    Other iron deficiency anemia    Pneumonia 2020   Prediabetes    Primary localized osteoarthritis of left knee    Pulmonary embolism (HCC) 2011   S/P cholecystectomy    S/P hip hemiarthroplasty    Skin lesion    Thrombocytopenia (HCC)    Thyroid nodule    Varicose veins of both lower extremities     Patient Active Problem List   Diagnosis Date Noted   Acute postoperative anemia due to expected blood loss 11/24/2021   Normocytic anemia 11/24/2021   History of pulmonary embolism 11/22/2021    Right upper lobe pneumonia 11/22/2021   Closed left hip fracture, initial encounter (HCC) 11/22/2021   S/P cholecystectomy 11/16/2021   Primary localized osteoarthritis of left knee    Hypertension    Factor V Leiden (HCC)    Essential hypertension 03/25/2016   Hyperlipidemia 03/25/2016   Dyspnea on exertion 03/25/2016   PULMONARY EMBOLISM 01/14/2010   PLEURAL EFFUSION, RIGHT 01/14/2010   MASS, LUNG 01/14/2010   Pulmonary embolism (HCC) 10/17/2009    Past Surgical History:  Procedure Laterality Date   ABDOMINAL HYSTERECTOMY  1973   APPENDECTOMY     age 40   CATARACT EXTRACTION Left 2018   CHOLECYSTECTOMY  11/2021   Dr Fredricka Bonine   COLONOSCOPY     FEMUR IM NAIL Left 11/23/2021   Procedure: INTRAMEDULLARY (IM) NAIL FEMORAL;  Surgeon: Samson Frederic, MD;  Location: [facility];  Service: Orthopedics;  Laterality: Left;   KNEE ARTHROSCOPY Left 2004   RETINAL LASER PROCEDURE     TEE WITHOUT CARDIOVERSION N/A 04/14/2022   Procedure: TRANSESOPHAGEAL ECHOCARDIOGRAM (TEE);  Surgeon: Wendall Stade, MD;  Location: [facility] ENDOSCOPY;  Service: Cardiovascular;  Laterality: N/A;  VENA CAVA FILTER PLACEMENT  2011    OB History   No obstetric history on file.      Home Medications    Prior to Admission medications   Medication Sig Start Date End Date Taking? Authorizing Provider  apixaban (ELIQUIS) 5 MG TABS tablet Take 5 mg by mouth 2 (two) times daily.   Yes [provider]  atorvastatin (LIPITOR) 10 MG tablet Take 10 mg by mouth at bedtime. 01/22/16  Yes [provider]  losartan (COZAAR) 50 MG tablet Take 50 mg by mouth daily. 01/05/22  Yes [provider]  omeprazole (PRILOSEC) 40 MG capsule Take 40 mg by mouth daily before breakfast.   Yes [provider]  ondansetron (ZOFRAN-ODT) 4 MG disintegrating tablet Take 1 tablet (4 mg total) by mouth every 8 (eight) hours as needed for nausea or vomiting. 05/08/23  Yes Jessabelle Markiewicz, Janace Aris, MD  acetaminophen (TYLENOL)  500 MG tablet Take 500-1,000 mg by mouth every 6 (six) hours as needed for mild pain or headache.    [provider]  albuterol (VENTOLIN HFA) 108 (90 Base) MCG/ACT inhaler Inhale 2 puffs into the lungs every 4 (four) hours as needed for wheezing or shortness of breath.    [provider]  carvedilol (COREG) 6.25 MG tablet Take 6.25 mg by mouth 2 (two) times daily with a meal.    [provider]  Cholecalciferol (VITAMIN D3 SUPER STRENGTH PO) Take 1,000 Units by mouth daily.    [provider]  ferrous sulfate 325 (65 FE) MG tablet Take 325 mg by mouth daily with breakfast.    [provider]  Omega-3 Fatty Acids (FISH OIL) 1000 MG CAPS Take 1,000 mg by mouth daily with breakfast.    [provider]  polyethylene glycol powder (GLYCOLAX/MIRALAX) 17 GM/SCOOP powder Take 8.5 g by mouth daily as needed for mild constipation (to mixed into 6-8 ounces of water before drinking).    [provider]  tetrahydrozoline-zinc (VISINE-AC) 0.05-0.25 % ophthalmic solution Apply 2 drops to eye daily as needed (dry eyes).    [provider]    Family History Family History  Problem Relation Age of Onset   Heart disease Mother        heart surgery   Parkinson's disease Father    Diabetes Sister    Diabetes Brother    Stroke Brother    Arrhythmia Daughter        afib   Breast cancer Neg Hx     Social History Social History   Tobacco Use   Smoking status: Never   Smokeless tobacco: Never  Vaping Use   Vaping status: Never Used  Substance Use Topics   Alcohol use: No   Drug use: No     Allergies   Patient has no known allergies.   Review of Systems Review of Systems  Neurological:  Positive for weakness.     Physical Exam Triage Vital Signs ED Triage Vitals  Encounter Vitals Group     BP 05/08/23 1153 (!) 151/96     Systolic BP Percentile --      Diastolic BP Percentile --      Pulse Rate 05/08/23 1153 95      Resp 05/08/23 1153 16     Temp 05/08/23 1153 98.9 F (37.2 C)     Temp Source 05/08/23 1153 Oral     SpO2 05/08/23 1153 95 %     Weight 05/08/23 1152 180 lb (81.6 kg)  Height 05/08/23 1152 5\' 3"  (1.6 m)     Head Circumference --      Peak Flow --      Pain Score 05/08/23 1152 0     Pain Loc --      Pain Education --      Exclude from Growth Chart --    No data found.  Updated Vital Signs BP (!) 151/96 (BP Location: Left Arm)   Pulse 95   Temp 98.9 F (37.2 C) (Oral)   Resp 16   Ht 5\' 3"  (1.6 m)   Wt 81.6 kg   SpO2 95%   BMI 31.89 kg/m   Visual Acuity Right Eye Distance:   Left Eye Distance:   Bilateral Distance:    Right Eye Near:   Left Eye Near:    Bilateral Near:     Physical Exam Vitals reviewed.  Constitutional:      General: She is not in acute distress.    Appearance: She is not ill-appearing, toxic-appearing or diaphoretic.  HENT:     Nose:     Comments: There is a large black/brown skin lesion on her right nose.    Mouth/Throat:     Mouth: Mucous membranes are moist.     Pharynx: No oropharyngeal exudate or posterior oropharyngeal erythema.  Eyes:     Extraocular Movements: Extraocular movements intact.     Pupils: Pupils are equal, round, and reactive to light.  Cardiovascular:     Heart sounds: No murmur heard.    Comments: Rate is 102 apically by me and the rhythm is regular. Pulmonary:     Effort: Pulmonary effort is normal. No respiratory distress.     Breath sounds: Normal breath sounds. No stridor. No wheezing, rhonchi or rales.  Abdominal:     General: There is no distension.     Palpations: Abdomen is soft.     Tenderness: There is no abdominal tenderness. There is no guarding.  Musculoskeletal:     Cervical back: Neck supple.  Lymphadenopathy:     Cervical: No cervical adenopathy.  Skin:    Capillary Refill: Capillary refill takes less than 2 seconds.     Coloration: Skin is not pale.  Neurological:     General: No focal  deficit present.     Mental Status: She is alert and oriented to person, place, and time.  Psychiatric:        Behavior: Behavior normal.      UC Treatments / Results  Labs (all labs ordered are listed, but only abnormal results are displayed) Labs Reviewed  CBC  COMPREHENSIVE METABOLIC PANEL    EKG   Radiology No results found.  Procedures Procedures (including critical care time)  Medications Ordered in UC Medications - No data to display  Initial Impression / Assessment and Plan / UC Course  I have reviewed the triage vital signs and the nursing notes.  Pertinent labs & imaging results that were available during my care of the patient were reviewed by me and considered in my medical decision making (see chart for details).       Overall her clinical appearance in the exam room is reassuring.  She is not tachycardic  Zofran is sent in for the nausea.  CMP and CBC are drawn today.  If there is anything significantly abnormal we will call her.  If she worsens anyway she is to present to the emergency room.  I have asked her to follow-up with her primary care  I am not going to do any further COVID testing, with her having had a negative at home and she does not have any respiratory symptoms. Final Clinical Impressions(s) / UC Diagnoses   Final diagnoses:  Weakness  Viral syndrome     Discharge Instructions      Ondansetron dissolved in the mouth every 8 hours as needed for nausea or vomiting. Clear liquids(water, gatorade/pedialyte, ginger ale/sprite, chicken broth/soup) and bland things(crackers/toast, rice, potato, bananas) to eat. Avoid acidic foods like lemon/lime/orange/tomato, and avoid greasy/spicy foods.  We have drawn blood to check your blood counts, sodium and potassium, and kidney and liver numbers.  Staff will notify if anything is significantly abnormal.  If you worsen anyway, please present to the emergency room  Otherwise please follow-up  with your primary care     ED Prescriptions     Medication Sig Dispense Auth. Provider   ondansetron (ZOFRAN-ODT) 4 MG disintegrating tablet Take 1 tablet (4 mg total) by mouth every 8 (eight) hours as needed for nausea or vomiting. 10 tablet Marlinda Mike Janace Aris, MD      PDMP not reviewed this encounter.   Zenia Resides, MD 05/08/23 [phone]

## 2023-06-07 DIAGNOSIS — C44622 Squamous cell carcinoma of skin of right upper limb, including shoulder: Secondary | ICD-10-CM | POA: Diagnosis not present

## 2023-07-18 DIAGNOSIS — M170 Bilateral primary osteoarthritis of knee: Secondary | ICD-10-CM | POA: Diagnosis not present

## 2023-07-25 ENCOUNTER — Encounter: Payer: Self-pay | Admitting: Physician Assistant

## 2023-07-25 ENCOUNTER — Ambulatory Visit: Payer: Medicare Other | Attending: Cardiology | Admitting: Physician Assistant

## 2023-07-25 VITALS — BP 150/80 | HR 83 | Ht 63.0 in | Wt 187.0 lb

## 2023-07-25 DIAGNOSIS — I340 Nonrheumatic mitral (valve) insufficiency: Secondary | ICD-10-CM

## 2023-07-25 DIAGNOSIS — I700 Atherosclerosis of aorta: Secondary | ICD-10-CM | POA: Diagnosis not present

## 2023-07-25 DIAGNOSIS — I10 Essential (primary) hypertension: Secondary | ICD-10-CM

## 2023-07-25 NOTE — Patient Instructions (Signed)
Medication Instructions:  NO CHANGES *If you need a refill on your cardiac medications before your next appointment, please call your pharmacy*   Lab Work: NO LABS If you have labs (blood work) drawn today and your tests are completely normal, you will receive your results only by: MyChart Message (if you have MyChart) OR A paper copy in the mail If you have any lab test that is abnormal or we need to change your treatment, we will call you to review the results.   Testing/Procedures:[address] IN FEBRUARY 2025. Your physician has requested that you have an echocardiogram. Echocardiography is a painless test that uses sound waves to create images of your heart. It provides your doctor with information about the size and shape of your heart and how well your heart's chambers and valves are working. This procedure takes approximately one hour. There are no restrictions for this procedure. Please do NOT wear cologne, perfume, aftershave, or lotions (deodorant is allowed). Please arrive 15 minutes prior to your appointment time.    Follow-Up: At [facility], you and your health needs are our priority.  As part of our continuing mission to provide you with exceptional heart care, we have created designated Provider Care Teams.  These Care Teams include your primary Cardiologist (physician) and Advanced Practice Providers (APPs -  Physician Assistants and Nurse Practitioners) who all work together to provide you with the care you need, when you need it.   Your next appointment:   6 month(s)  Provider:   Alverda Skeans, MD  Other Instructions CONTINUE MONITORING BLOOD PRESSURE

## 2023-07-25 NOTE — Progress Notes (Signed)
Cardiology Office Note:  .   Date:  07/25/2023  ID:  Sara Rodriguez, DOB February 08, 1933, MRN [id number] PCP: Georgann Housekeeper, MD  [facility] Providers Cardiologist:  Orbie Pyo, MD     History of Present Illness: .   Sara Rodriguez is a 89 y.o. female with PMH of factor V Leiden, coronary artery calcification seen on CT scan, longstanding persistent A-fib, hypertension, hyperlipidemia, CKD stage III and mitral valve insufficiency.  She was diagnosed with atrial fibrillation by her PCP.  She was already on anticoagulation therapy at the time due to history of factor V Leiden.  Echocardiogram obtained on 03/25/2022 showed EF 55 to 60%, moderately elevated PASP, severe LAE, severe MR, severe TR.  Subsequent TEE obtained on 04/14/2022 showed EF 30 to 65%, normal RV, severe LAE was no atrial appendage thrombus, moderate MR.  She was last seen by Dr. Lynnette Caffey in December 2023 at which time she continued to have dyspnea on exertion however unchanged.  It was felt that her dyspnea on exertion is likely chronic.  She has been in persistent atrial fibrillation since May 2023.  Based on office note from May 2023, rate control strategy was recommended.  Echocardiogram obtained on 11/29/2022 showed EF 60 to 65%, no regional wall motion abnormality, normal RV, mildly elevated PASP, mild MR.  Since the last visit, patient was seen in the ED in July 2024 with nausea and weakness.  She was given Zofran.  Patient presents today for follow-up.  According to daughter who accompanied her, she has been feeling poorly.  However when I try to ask patient how long she has been feeling poorly, she says that she has been feeling poorly for a long time, potentially more than a year.  She just feels fatigued all the time.  She denies any chest pain or shortness of breath.  She has no lower extremity edema, orthopnea or PND.  She is due for repeat echocardiogram around February of next year.  I do not think her fatigue is  coming from cardiac issues.  She can follow-up with Dr. Lynnette Caffey in 43-month.  Blood pressure is elevated today, I asked her to continue to pay attention to the blood pressure at home.  Daughter says her blood pressure is typically in the 130s.  If blood pressure remains elevated at home in the future with systolic blood pressure greater than 140 mmHg, I would recommend increase the losartan to 100 mg daily.  ROS:   She denies chest pain, palpitations, dyspnea, pnd, orthopnea, n, v, dizziness, syncope, edema, weight gain, or early satiety. All other systems reviewed and are otherwise negative except as noted above.    Studies Reviewed: Marland Kitchen   EKG Interpretation Date/Time:  Tuesday July 25 2023 15:10:48 EDT Ventricular Rate:  83 PR Interval:    QRS Duration:  100 QT Interval:  366 QTC Calculation: 430 R Axis:   -26  Text Interpretation: Atrial fibrillation Incomplete right bundle branch block Confirmed by Azalee Course [phone]) on 07/25/2023 4:54:13 PM     Risk Assessment/Calculations:    CHA2DS2-VASc Score = 5   This indicates a 7.2% annual risk of stroke. The patient's score is based upon: CHF History: 0 HTN History: 1 Diabetes History: 0 Stroke History: 0 Vascular Disease History: 1 Age Score: 2 Gender Score: 1          Physical Exam:   VS:  BP (!) 150/80 (BP Location: Left Arm, Patient Position: Sitting, Cuff Size: Normal)  Pulse 83   Ht 5\' 3"  (1.6 m)   Wt 187 lb (84.8 kg)   SpO2 97%   BMI 33.13 kg/m    Wt Readings from Last 3 Encounters:  07/25/23 187 lb (84.8 kg)  05/08/23 180 lb (81.6 kg)  09/26/22 181 lb (82.1 kg)    GEN: Well nourished, well developed in no acute distress NECK: No JVD; No carotid bruits CARDIAC: RRR, no murmurs, rubs, gallops RESPIRATORY:  Clear to auscultation without rales, wheezing or rhonchi  ABDOMEN: Soft, non-tender, non-distended EXTREMITIES:  No edema; No deformity   ASSESSMENT AND PLAN: .    Longstanding persistent atrial  fibrillation: Diagnosed by PCP and has been in persistent atrial fibrillation since at least May 2023.  Previous note recommended continue with rate control strategy.  Patient has no cardiac awareness of atrial fibrillation.  She was already on Eliquis for factor V Leiden and DVT/PE even prior to diagnosis of A-fib  Hypertension: Blood pressure elevated today, on losartan and carvedilol.  Daughter says her blood pressure is typically in the 130s at home.  I recommended continue to keep a blood pressure diary at home, if systolic blood pressure is persistently in the 140 or higher, I would recommend increase losartan to 100 mg daily  Hyperlipidemia: On Lipitor  DM2: Managed by primary care provider  Mitral valve insufficiency: Due for repeat echocardiogram in February 2025.  History of factor V Leiden: On Eliquis.       Dispo: Follow-up with Dr. Lynnette Caffey in 98-month  Signed, Azalee Course, [place]

## 2023-08-08 DIAGNOSIS — M25562 Pain in left knee: Secondary | ICD-10-CM | POA: Diagnosis not present

## 2023-08-08 DIAGNOSIS — M25561 Pain in right knee: Secondary | ICD-10-CM | POA: Diagnosis not present

## 2023-08-08 DIAGNOSIS — M6281 Muscle weakness (generalized): Secondary | ICD-10-CM | POA: Diagnosis not present

## 2023-08-15 DIAGNOSIS — M25562 Pain in left knee: Secondary | ICD-10-CM | POA: Diagnosis not present

## 2023-08-15 DIAGNOSIS — M6281 Muscle weakness (generalized): Secondary | ICD-10-CM | POA: Diagnosis not present

## 2023-08-15 DIAGNOSIS — M25561 Pain in right knee: Secondary | ICD-10-CM | POA: Diagnosis not present

## 2023-08-22 DIAGNOSIS — M25562 Pain in left knee: Secondary | ICD-10-CM | POA: Diagnosis not present

## 2023-08-22 DIAGNOSIS — M25561 Pain in right knee: Secondary | ICD-10-CM | POA: Diagnosis not present

## 2023-08-22 DIAGNOSIS — M6281 Muscle weakness (generalized): Secondary | ICD-10-CM | POA: Diagnosis not present

## 2023-08-30 DIAGNOSIS — M25562 Pain in left knee: Secondary | ICD-10-CM | POA: Diagnosis not present

## 2023-08-30 DIAGNOSIS — M25561 Pain in right knee: Secondary | ICD-10-CM | POA: Diagnosis not present

## 2023-08-30 DIAGNOSIS — M6281 Muscle weakness (generalized): Secondary | ICD-10-CM | POA: Diagnosis not present

## 2023-09-21 ENCOUNTER — Ambulatory Visit: Payer: Medicare Other | Admitting: Cardiology

## 2023-10-31 DIAGNOSIS — I10 Essential (primary) hypertension: Secondary | ICD-10-CM | POA: Diagnosis not present

## 2023-10-31 DIAGNOSIS — E041 Nontoxic single thyroid nodule: Secondary | ICD-10-CM | POA: Diagnosis not present

## 2023-10-31 DIAGNOSIS — D682 Hereditary deficiency of other clotting factors: Secondary | ICD-10-CM | POA: Diagnosis not present

## 2023-10-31 DIAGNOSIS — I4891 Unspecified atrial fibrillation: Secondary | ICD-10-CM | POA: Diagnosis not present

## 2023-10-31 DIAGNOSIS — Z0000 Encounter for general adult medical examination without abnormal findings: Secondary | ICD-10-CM | POA: Diagnosis not present

## 2023-10-31 DIAGNOSIS — E782 Mixed hyperlipidemia: Secondary | ICD-10-CM | POA: Diagnosis not present

## 2023-10-31 DIAGNOSIS — I272 Pulmonary hypertension, unspecified: Secondary | ICD-10-CM | POA: Diagnosis not present

## 2023-10-31 DIAGNOSIS — R7303 Prediabetes: Secondary | ICD-10-CM | POA: Diagnosis not present

## 2023-10-31 DIAGNOSIS — Z1331 Encounter for screening for depression: Secondary | ICD-10-CM | POA: Diagnosis not present

## 2023-11-13 DIAGNOSIS — Z961 Presence of intraocular lens: Secondary | ICD-10-CM | POA: Diagnosis not present

## 2023-11-13 DIAGNOSIS — H40013 Open angle with borderline findings, low risk, bilateral: Secondary | ICD-10-CM | POA: Diagnosis not present

## 2023-11-13 DIAGNOSIS — H353131 Nonexudative age-related macular degeneration, bilateral, early dry stage: Secondary | ICD-10-CM | POA: Diagnosis not present

## 2023-11-21 ENCOUNTER — Ambulatory Visit (HOSPITAL_COMMUNITY): Payer: Medicare Other | Attending: Physician Assistant

## 2023-11-21 DIAGNOSIS — I340 Nonrheumatic mitral (valve) insufficiency: Secondary | ICD-10-CM | POA: Insufficient documentation

## 2023-11-21 LAB — ECHOCARDIOGRAM COMPLETE
Area-P 1/2: 4.33 cm2
MV M vel: 5.32 m/s
MV Peak grad: 113.2 mm[Hg]
Radius: 0.5 cm
S' Lateral: 2.9 cm

## 2023-12-01 DIAGNOSIS — R1011 Right upper quadrant pain: Secondary | ICD-10-CM | POA: Diagnosis not present

## 2023-12-01 DIAGNOSIS — Z9049 Acquired absence of other specified parts of digestive tract: Secondary | ICD-10-CM | POA: Diagnosis not present

## 2024-01-24 DIAGNOSIS — L570 Actinic keratosis: Secondary | ICD-10-CM | POA: Diagnosis not present

## 2024-03-08 ENCOUNTER — Other Ambulatory Visit (HOSPITAL_COMMUNITY): Payer: Self-pay | Admitting: Internal Medicine

## 2024-03-08 DIAGNOSIS — N1831 Chronic kidney disease, stage 3a: Secondary | ICD-10-CM | POA: Diagnosis not present

## 2024-03-08 DIAGNOSIS — I10 Essential (primary) hypertension: Secondary | ICD-10-CM | POA: Diagnosis not present

## 2024-03-08 DIAGNOSIS — D6869 Other thrombophilia: Secondary | ICD-10-CM | POA: Diagnosis not present

## 2024-03-08 DIAGNOSIS — Z8709 Personal history of other diseases of the respiratory system: Secondary | ICD-10-CM

## 2024-03-08 DIAGNOSIS — J42 Unspecified chronic bronchitis: Secondary | ICD-10-CM | POA: Diagnosis not present

## 2024-03-21 ENCOUNTER — Ambulatory Visit (HOSPITAL_COMMUNITY)
Admission: RE | Admit: 2024-03-21 | Discharge: 2024-03-21 | Disposition: A | Discharge status: Home / Self Care | Source: Ambulatory Visit | Attending: Internal Medicine | Admitting: Internal Medicine

## 2024-03-21 DIAGNOSIS — J42 Unspecified chronic bronchitis: Secondary | ICD-10-CM | POA: Diagnosis not present

## 2024-03-21 DIAGNOSIS — Z8709 Personal history of other diseases of the respiratory system: Secondary | ICD-10-CM | POA: Diagnosis not present

## 2024-03-21 DIAGNOSIS — E041 Nontoxic single thyroid nodule: Secondary | ICD-10-CM | POA: Diagnosis not present

## 2024-03-21 MED ORDER — IOHEXOL 300 MG/ML  SOLN
75.0000 mL | Freq: Once | INTRAMUSCULAR | Status: AC | PRN
  Administered 2024-03-21: 75 mL via INTRAVENOUS

## 2024-03-27 DIAGNOSIS — E042 Nontoxic multinodular goiter: Secondary | ICD-10-CM | POA: Diagnosis not present

## 2024-03-27 DIAGNOSIS — E041 Nontoxic single thyroid nodule: Secondary | ICD-10-CM | POA: Diagnosis not present

## 2024-05-19 DIAGNOSIS — K219 Gastro-esophageal reflux disease without esophagitis: Secondary | ICD-10-CM | POA: Insufficient documentation

## 2024-05-19 DIAGNOSIS — H9190 Unspecified hearing loss, unspecified ear: Secondary | ICD-10-CM | POA: Insufficient documentation

## 2024-05-19 DIAGNOSIS — D682 Hereditary deficiency of other clotting factors: Secondary | ICD-10-CM | POA: Insufficient documentation

## 2024-05-19 DIAGNOSIS — M810 Age-related osteoporosis without current pathological fracture: Secondary | ICD-10-CM | POA: Insufficient documentation

## 2024-05-19 DIAGNOSIS — I700 Atherosclerosis of aorta: Secondary | ICD-10-CM | POA: Insufficient documentation

## 2024-05-19 DIAGNOSIS — R739 Hyperglycemia, unspecified: Secondary | ICD-10-CM | POA: Insufficient documentation

## 2024-05-19 DIAGNOSIS — E7800 Pure hypercholesterolemia, unspecified: Secondary | ICD-10-CM | POA: Insufficient documentation

## 2024-05-19 DIAGNOSIS — N183 Chronic kidney disease, stage 3 unspecified: Secondary | ICD-10-CM | POA: Insufficient documentation

## 2024-05-19 DIAGNOSIS — R7303 Prediabetes: Secondary | ICD-10-CM | POA: Insufficient documentation

## 2024-05-19 DIAGNOSIS — Z7901 Long term (current) use of anticoagulants: Secondary | ICD-10-CM | POA: Insufficient documentation

## 2024-05-19 DIAGNOSIS — I4891 Unspecified atrial fibrillation: Secondary | ICD-10-CM | POA: Insufficient documentation

## 2024-05-19 DIAGNOSIS — D6869 Other thrombophilia: Secondary | ICD-10-CM | POA: Insufficient documentation

## 2024-05-19 DIAGNOSIS — I8290 Acute embolism and thrombosis of unspecified vein: Secondary | ICD-10-CM | POA: Insufficient documentation

## 2024-05-19 DIAGNOSIS — D696 Thrombocytopenia, unspecified: Secondary | ICD-10-CM | POA: Insufficient documentation

## 2024-05-19 DIAGNOSIS — I340 Nonrheumatic mitral (valve) insufficiency: Secondary | ICD-10-CM | POA: Insufficient documentation

## 2024-05-19 DIAGNOSIS — D509 Iron deficiency anemia, unspecified: Secondary | ICD-10-CM | POA: Insufficient documentation

## 2024-05-20 ENCOUNTER — Encounter: Payer: Self-pay | Admitting: Podiatry

## 2024-05-20 ENCOUNTER — Ambulatory Visit: Admitting: Podiatry

## 2024-05-20 DIAGNOSIS — L84 Corns and callosities: Secondary | ICD-10-CM | POA: Diagnosis not present

## 2024-05-20 DIAGNOSIS — M79675 Pain in left toe(s): Secondary | ICD-10-CM

## 2024-05-20 DIAGNOSIS — B351 Tinea unguium: Secondary | ICD-10-CM | POA: Diagnosis not present

## 2024-05-20 DIAGNOSIS — Z7901 Long term (current) use of anticoagulants: Secondary | ICD-10-CM

## 2024-05-20 DIAGNOSIS — M79674 Pain in right toe(s): Secondary | ICD-10-CM | POA: Diagnosis not present

## 2024-05-20 DIAGNOSIS — I739 Peripheral vascular disease, unspecified: Secondary | ICD-10-CM | POA: Diagnosis not present

## 2024-05-20 NOTE — Progress Notes (Unsigned)
 Subjective:  Patient ID: Sara Rodriguez, female    DOB: 1933-02-26,  MRN: [id number]  Sara Rodriguez presents to clinic today for:  Chief Complaint  Patient presents with   Foot Pain    Pt is here for RFC Toe nail cut and having foot pain.  Has calluses on bilateral 3rd, 4th, 5th lateral metatarsals. Pain level 8.  Feet get hot feeling She has been soaking in epson salt. Has been going on about a year. Non diabetic Is taking Elequis      Patient notes nails are thick and elongated, causing pain in shoe gear when ambulating.  She has painful calluses bilateral submet 4 and submet 5.  The most painful lesion is left submet 5.  She takes Eliquis   PCP is Ransom Other, MD. last seen 03/08/2024  Past Medical History:  Diagnosis Date   A-fib [facility])    Anticoagulant long-term use    Aortic atherosclerosis (HCC)    Cancer (HCC)    skin cancer   Chronic kidney disease, stage 3 (HCC)    Diastolic dysfunction    DVT (deep venous thrombosis) (HCC)    Dyspnea    Elevated glucose    Factor V deficiency (HCC)    Factor V Leiden (HCC)    pulmonary embolism 2011   GERD (gastroesophageal reflux disease)    Hearing loss    History of hiatal hernia    Hypercholesterolemia    Hyperlipidemia    Hypertension    Loss of appetite    Obesity    On apixaban  therapy    Osteoarthritis    Osteoporosis    Other iron deficiency anemia    Pneumonia 2020   Prediabetes    Primary localized osteoarthritis of left knee    Pulmonary embolism (HCC) 2011   S/P cholecystectomy    S/P hip hemiarthroplasty    Skin lesion    Thrombocytopenia (HCC)    Thyroid  nodule    Varicose veins of both lower extremities    No Known Allergies  Objective:  Sara Rodriguez is a pleasant 89 y.o. female in NAD. AAO x 3.  Vascular Examination: Patient has palpable DP pulse, absent PT pulse bilateral.  Delayed capillary refill bilateral toes.  Sparse digital hair bilateral.  Proximal to distal cooling  WNL bilateral.    Dermatological Examination: Interspaces are clear with no open lesions noted bilateral.  Skin is shiny and atrophic bilateral.  Nails are 3-6mm thick, with yellowish/brown discoloration, subungual debris and distal onycholysis x10.  There is pain with compression of nails x10.  There are hyperkeratotic lesions noted bilateral submet 4 and bilateral submet 5.  No ulceration is noted..  Patient qualifies for at-risk foot care because of PVD.  Assessment/Plan: 1. Pain due to onychomycosis of toenails of both feet   2. Callus of foot   3. PVD (peripheral vascular disease) (HCC)    Mycotic nails x10 were sharply debrided with sterile nail nippers and power debriding burr to decrease bulk and length.  Hyperkeratotic lesions x 4 were shaved with #312 blade.  All lesions were located on the ball of the foot bilateral (the locations are listed above in the dermatological examination)  Return in about 3 months (around 08/20/2024) for RFC.   Awanda CHARM Imperial, DPM, FACFAS [facility]     [address]  [place], [place] [address]                Office [phone]  Fax [phone]

## 2024-06-26 DIAGNOSIS — I10 Essential (primary) hypertension: Secondary | ICD-10-CM | POA: Diagnosis not present

## 2024-06-26 DIAGNOSIS — N1831 Chronic kidney disease, stage 3a: Secondary | ICD-10-CM | POA: Diagnosis not present

## 2024-06-26 DIAGNOSIS — I272 Pulmonary hypertension, unspecified: Secondary | ICD-10-CM | POA: Diagnosis not present

## 2024-06-26 DIAGNOSIS — I4891 Unspecified atrial fibrillation: Secondary | ICD-10-CM | POA: Diagnosis not present

## 2024-07-22 DIAGNOSIS — M170 Bilateral primary osteoarthritis of knee: Secondary | ICD-10-CM | POA: Diagnosis not present

## 2024-07-30 DIAGNOSIS — Z23 Encounter for immunization: Secondary | ICD-10-CM | POA: Diagnosis not present

## 2024-07-30 DIAGNOSIS — I10 Essential (primary) hypertension: Secondary | ICD-10-CM | POA: Diagnosis not present

## 2024-07-30 DIAGNOSIS — R300 Dysuria: Secondary | ICD-10-CM | POA: Diagnosis not present

## 2024-08-26 ENCOUNTER — Ambulatory Visit: Admitting: Podiatry

## 2024-08-26 ENCOUNTER — Encounter: Payer: Self-pay | Admitting: Podiatry

## 2024-08-26 DIAGNOSIS — M79674 Pain in right toe(s): Secondary | ICD-10-CM

## 2024-08-26 DIAGNOSIS — B351 Tinea unguium: Secondary | ICD-10-CM | POA: Diagnosis not present

## 2024-08-26 DIAGNOSIS — I739 Peripheral vascular disease, unspecified: Secondary | ICD-10-CM | POA: Diagnosis not present

## 2024-08-26 DIAGNOSIS — M79675 Pain in left toe(s): Secondary | ICD-10-CM | POA: Diagnosis not present

## 2024-08-26 DIAGNOSIS — Z7901 Long term (current) use of anticoagulants: Secondary | ICD-10-CM

## 2024-08-26 DIAGNOSIS — L84 Corns and callosities: Secondary | ICD-10-CM | POA: Diagnosis not present

## 2024-08-26 NOTE — Progress Notes (Unsigned)
    Subjective:  Patient ID: Dickey Larnell Collet, female    DOB: 1933/09/01,  MRN: [id number]  Dickey Larnell Collet presents to clinic today for:  Chief Complaint  Patient presents with   [facility]    RFC Non diabetic toenail trim and callus care. 0 pain.   Patient notes nails are thick and elongated, causing pain in shoe gear when ambulating.  She has painful calluses bilateral submet 5.   She takes Eliquis   PCP is Ransom Other, MD. last seen around 07/30/2024  Past Medical History:  Diagnosis Date   A-fib [facility])    Anticoagulant long-term use    Aortic atherosclerosis    Cancer (HCC)    skin cancer   Chronic kidney disease, stage 3 (HCC)    Diastolic dysfunction    DVT (deep venous thrombosis) (HCC)    Dyspnea    Elevated glucose    Factor V deficiency (HCC)    Factor V Leiden    pulmonary embolism 2011   GERD (gastroesophageal reflux disease)    Hearing loss    History of hiatal hernia    Hypercholesterolemia    Hyperlipidemia    Hypertension    Loss of appetite    Obesity    On apixaban  therapy    Osteoarthritis    Osteoporosis    Other iron deficiency anemia    Pneumonia 2020   Prediabetes    Primary localized osteoarthritis of left knee    Pulmonary embolism (HCC) 2011   S/P cholecystectomy    S/P hip hemiarthroplasty    Skin lesion    Thrombocytopenia    Thyroid  nodule    Varicose veins of both lower extremities    No Known Allergies  Objective:  Luwanna Brossman is a pleasant 89 y.o. female in NAD. AAO x 3.  Vascular Examination: Patient has palpable DP pulse, absent PT pulse bilateral.  Delayed capillary refill bilateral toes.  Sparse digital hair bilateral.  Proximal to distal cooling WNL bilateral.    Dermatological Examination: Interspaces are clear with no open lesions noted bilateral.  Skin is shiny and atrophic bilateral.  Nails are 3-31mm thick, with yellowish/brown discoloration, subungual debris and distal onycholysis x10.  There is pain with  compression of nails x10.  There are hyperkeratotic lesions noted bilateral submet 5.  No ulceration is noted..  Patient qualifies for at-risk foot care because of PVD.  Assessment/Plan: 1. Pain due to onychomycosis of toenails of both feet   2. Callus of foot   3. PVD (peripheral vascular disease)   4. Long term current use of anticoagulant    Mycotic nails x10 were sharply debrided with sterile nail nippers and power debriding burr to decrease bulk and length.  Hyperkeratotic lesions x 2, bilateral submet 5, were shaved with #312 blade.    Return in about 3 months (around 11/26/2024) for RFC.   Awanda CHARM Imperial, DPM, FACFAS [facility]     [address] [place], [place] [address]                Office [phone]  Fax [phone]

## 2024-09-15 ENCOUNTER — Encounter (HOSPITAL_COMMUNITY): Payer: Self-pay

## 2024-09-15 ENCOUNTER — Ambulatory Visit (INDEPENDENT_AMBULATORY_CARE_PROVIDER_SITE_OTHER)

## 2024-09-15 ENCOUNTER — Ambulatory Visit (HOSPITAL_COMMUNITY): Admission: EM | Admit: 2024-09-15 | Discharge: 2024-09-15 | Disposition: A | Discharge status: Home / Self Care

## 2024-09-15 DIAGNOSIS — R053 Chronic cough: Secondary | ICD-10-CM | POA: Diagnosis not present

## 2024-09-15 DIAGNOSIS — R0602 Shortness of breath: Secondary | ICD-10-CM

## 2024-09-15 DIAGNOSIS — J189 Pneumonia, unspecified organism: Secondary | ICD-10-CM | POA: Diagnosis not present

## 2024-09-15 DIAGNOSIS — K449 Diaphragmatic hernia without obstruction or gangrene: Secondary | ICD-10-CM | POA: Diagnosis not present

## 2024-09-15 DIAGNOSIS — R0989 Other specified symptoms and signs involving the circulatory and respiratory systems: Secondary | ICD-10-CM | POA: Diagnosis not present

## 2024-09-15 MED ORDER — DOXYCYCLINE HYCLATE 100 MG PO CAPS: 100.0000 mg | ORAL_CAPSULE | Freq: Two times a day (BID) | ORAL | 0 refills | Status: AC

## 2024-09-15 MED ORDER — IPRATROPIUM-ALBUTEROL 0.5-2.5 (3) MG/3ML IN SOLN
RESPIRATORY_TRACT | Status: AC
  Filled 2024-09-15: qty 3

## 2024-09-15 MED ORDER — IPRATROPIUM-ALBUTEROL 0.5-2.5 (3) MG/3ML IN SOLN
3.0000 mL | Freq: Once | RESPIRATORY_TRACT | Status: AC
  Administered 2024-09-15: 3 mL via RESPIRATORY_TRACT

## 2024-09-15 NOTE — Discharge Instructions (Signed)
 The chest xray looks similar to your previous. The radiologist does not see a pneumonia, but I have concern for something in the right lower lobe of your lung. I am treating you with an antibiotic, Please take doxycycline  as prescribed. Take with food to avoid upset stomach. Finish the full course - you should not have any leftover! Use your albuterol  inhaler 2-3 times daily for a few days.  Please go to the emergency department if symptoms worsen.

## 2024-09-15 NOTE — ED Triage Notes (Signed)
 Patient presenting with SOB, cough, runny nose, and congestion onset 2 weeks ago. States the symptoms have gotten worse. No known sick exposure. Patient has history of Pulmonary emboli but denies any other respiratory history.   Tried tylenol  and cough syrup with mild relief.

## 2024-09-15 NOTE — ED Provider Notes (Signed)
 [facility]    CSN: [id number] Arrival date & time: 09/15/24  1111      History   Chief Complaint Chief Complaint  Patient presents with   Cough    HPI Sara Rodriguez is a 89 y.o. female.  Accompanied by daughter 2 weeks of runny nose and cough Over the last 4-5 days cough has worsened, more frequent and now productive. She reports shortness of breath, especially at night, and wheezing sensation. Pain in the right lower chest with cough.  No fever or chills.  Has tried tylenol , albuterol  inhaler, OTC cough syrup   Chronic bronchitis  Afib on eliquis  CKD, HTN, DVT and PE, pulmonary HTN, factor 5 leiden, iron deficiency. Reports compliance with BP meds, no missed doses  Past Medical History:  Diagnosis Date   A-fib (HCC)    Anticoagulant long-term use    Aortic atherosclerosis    Cancer (HCC)    skin cancer   Chronic kidney disease, stage 3 (HCC)    Diastolic dysfunction    DVT (deep venous thrombosis) (HCC)    Dyspnea    Elevated glucose    Factor V deficiency (HCC)    Factor V Leiden    pulmonary embolism 2011   GERD (gastroesophageal reflux disease)    Hearing loss    History of hiatal hernia    Hypercholesterolemia    Hyperlipidemia    Hypertension    Loss of appetite    Obesity    On apixaban  therapy    Osteoarthritis    Osteoporosis    Other iron deficiency anemia    Pneumonia 2020   Prediabetes    Primary localized osteoarthritis of left knee    Pulmonary embolism (HCC) 2011   S/P cholecystectomy    S/P hip hemiarthroplasty    Skin lesion    Thrombocytopenia    Thyroid  nodule    Varicose veins of both lower extremities     Patient Active Problem List   Diagnosis Date Noted   Acquired thrombophilia 05/19/2024   Atrial fibrillation (HCC) 05/19/2024   Factor V deficiency (HCC) 05/19/2024   Gastro-esophageal reflux disease without esophagitis 05/19/2024   Hardening of the aorta (main artery of the heart) 05/19/2024   Hearing  loss 05/19/2024   Hyperglycemia 05/19/2024   Iron deficiency anemia 05/19/2024   Long term current use of anticoagulant therapy 05/19/2024   Mitral regurgitation 05/19/2024   Osteoporosis 05/19/2024   Prediabetes 05/19/2024   Pure hypercholesterolemia 05/19/2024   Stage 3 chronic kidney disease (HCC) 05/19/2024   Thrombocytopenia 05/19/2024   Venous thromboembolism (VTE) 05/19/2024   Acute postoperative anemia due to expected blood loss 11/24/2021   Normocytic anemia 11/24/2021   History of pulmonary embolism 11/22/2021   Right upper lobe pneumonia 11/22/2021   Closed left hip fracture, initial encounter (HCC) 11/22/2021   S/P cholecystectomy 11/16/2021   Primary localized osteoarthritis of left knee    Hypertension    Factor V Leiden    Essential hypertension 03/25/2016   Hyperlipidemia 03/25/2016   Dyspnea on exertion 03/25/2016   PULMONARY EMBOLISM 01/14/2010   PLEURAL EFFUSION, RIGHT 01/14/2010   MASS, LUNG 01/14/2010   Pulmonary embolism (HCC) 10/17/2009    Past Surgical History:  Procedure Laterality Date   ABDOMINAL HYSTERECTOMY  1973   APPENDECTOMY     age 68   CATARACT EXTRACTION Left 2018   CHOLECYSTECTOMY  11/2021   Dr Signe   COLONOSCOPY     FEMUR IM NAIL Left 11/23/2021   Procedure:  INTRAMEDULLARY (IM) NAIL FEMORAL;  Surgeon: Fidel Rogue, MD;  Location: [facility];  Service: Orthopedics;  Laterality: Left;   KNEE ARTHROSCOPY Left 2004   RETINAL LASER PROCEDURE     TEE WITHOUT CARDIOVERSION N/A 04/14/2022   Procedure: TRANSESOPHAGEAL ECHOCARDIOGRAM (TEE);  Surgeon: Delford Maude BROCKS, MD;  Location: [facility] ENDOSCOPY;  Service: Cardiovascular;  Laterality: N/A;   VENA CAVA FILTER PLACEMENT  2011    OB History   No obstetric history on file.      Home Medications    Prior to Admission medications   Medication Sig Start Date End Date Taking? Authorizing Provider  acetaminophen  (TYLENOL ) 500 MG tablet Take 500-1,000 mg by mouth every 6 (six) hours as needed for  mild pain or headache.   Yes [provider]  albuterol  (VENTOLIN  HFA) 108 (90 Base) MCG/ACT inhaler Inhale 2 puffs into the lungs every 4 (four) hours as needed for wheezing or shortness of breath.   Yes [provider]  apixaban  (ELIQUIS ) 5 MG TABS tablet Take 5 mg by mouth 2 (two) times daily.   Yes [provider]  atorvastatin  (LIPITOR) 10 MG tablet Take 10 mg by mouth at bedtime. 01/22/16  Yes [provider]  carvedilol (COREG) 6.25 MG tablet Take 6.25 mg by mouth 2 (two) times daily with a meal.   Yes [provider]  Cholecalciferol (VITAMIN D3 SUPER STRENGTH PO) Take 1,000 Units by mouth daily.   Yes [provider]  Cyanocobalamin (B-12 PO) Take by mouth.   Yes [provider]  doxycycline (VIBRAMYCIN) 100 MG capsule Take 1 capsule (100 mg total) by mouth 2 (two) times daily for 7 days. 09/15/24 09/22/24 Yes Dawit Tankard, Asberry, PA-C  ferrous sulfate  325 (65 FE) MG tablet Take 325 mg by mouth daily with breakfast.   Yes [provider]  losartan (COZAAR) 50 MG tablet Take 50 mg by mouth daily. 01/05/22  Yes [provider]  Omega-3 Fatty Acids (FISH OIL ) 1000 MG CAPS Take 1,000 mg by mouth daily with breakfast.   Yes [provider]  omeprazole (PRILOSEC) 40 MG capsule Take 40 mg by mouth daily before breakfast.   Yes [provider]  ondansetron  (ZOFRAN -ODT) 4 MG disintegrating tablet Take 1 tablet (4 mg total) by mouth every 8 (eight) hours as needed for nausea or vomiting. 05/08/23  Yes Banister, Pamela K, MD  polyethylene glycol powder (GLYCOLAX /MIRALAX ) 17 GM/SCOOP powder Take 8.5 g by mouth daily as needed for mild constipation (to mixed into 6-8 ounces of water  before drinking).   Yes [provider]  tetrahydrozoline-zinc (VISINE-AC) 0.05-0.25 % ophthalmic solution Apply 2 drops to eye daily as needed (dry eyes).   Yes [provider]    Family History Family History   Problem Relation Age of Onset   Heart disease Mother        heart surgery   Parkinson's disease Father    Diabetes Sister    Diabetes Brother    Stroke Brother    Arrhythmia Daughter        afib   Breast cancer Neg Hx     Social History Social History   Tobacco Use   Smoking status: Never   Smokeless tobacco: Never  Vaping Use   Vaping status: Never Used  Substance Use Topics   Alcohol  use: No   Drug use: No     Allergies   Patient has no known allergies.   Review of Systems Review of Systems  Respiratory:  Positive  for cough.    As per HPI  Physical Exam Triage Vital Signs ED Triage Vitals  Encounter Vitals Group     BP 09/15/24 1202 (!) 181/80     Girls Systolic BP Percentile --      Girls Diastolic BP Percentile --      Boys Systolic BP Percentile --      Boys Diastolic BP Percentile --      Pulse Rate 09/15/24 1122 85     Resp 09/15/24 1122 20     Temp 09/15/24 1202 97.6 F (36.4 C)     Temp Source 09/15/24 1202 Oral     SpO2 09/15/24 1122 98 %     Weight --      Height 09/15/24 1202 5' 3 (1.6 m)     Head Circumference --      Peak Flow --      Pain Score 09/15/24 1200 0     Pain Loc --      Pain Education --      Exclude from Growth Chart --    No data found.  Updated Vital Signs BP (!) 145/73 (BP Location: Left Arm)   Pulse 74   Temp 97.6 F (36.4 C) (Oral)   Resp 18   Ht 5' 3 (1.6 m)   SpO2 93%   BMI 33.13 kg/m    Physical Exam Vitals and nursing note reviewed.  Constitutional:      Appearance: She is not ill-appearing.  HENT:     Right Ear: Tympanic membrane and ear canal normal.     Left Ear: Tympanic membrane and ear canal normal.     Nose: No congestion or rhinorrhea.     Mouth/Throat:     Mouth: Mucous membranes are moist.     Pharynx: Oropharynx is clear. No posterior oropharyngeal erythema.  Eyes:     Conjunctiva/sclera: Conjunctivae normal.  Cardiovascular:     Rate and Rhythm: Normal rate and regular rhythm.      Pulses: Normal pulses.     Heart sounds: Normal heart sounds.  Pulmonary:     Effort: Pulmonary effort is normal. No tachypnea.     Breath sounds: Decreased breath sounds present.     Comments: Patient can only take shallow breaths on exam. Limited exam. Decreased breath sounds. She speaks in full sentences, does not appear in respiratory distress, breathing is unlabored.  Musculoskeletal:     Cervical back: Normal range of motion.  Lymphadenopathy:     Cervical: No cervical adenopathy.  Skin:    General: Skin is warm and dry.  Neurological:     Mental Status: She is alert and oriented to person, place, and time. Mental status is at baseline.     Comments: Answers questions appropriately. At baseline per daughter      UC Treatments / Results  Labs (all labs ordered are listed, but only abnormal results are displayed) Labs Reviewed - No data to display  EKG  Radiology DG Chest 2 View Result Date: 09/15/2024 CLINICAL DATA:  Short of breath cough and congestion beginning 2 weeks ago. EXAM: CHEST - 2 VIEW COMPARISON:  01/23/2022.  CT, 03/21/2024. FINDINGS: Mild enlargement of the cardiac silhouette. Moderate size hiatal hernia. No mediastinal or hilar masses. No convincing adenopathy. Lungs are hyperexpanded. There are prominent vascular and interstitial markings, chronic and unchanged from the prior study. No pleural effusion or pneumothorax. Skeletal structures are demineralized, grossly intact. IMPRESSION: 1. No acute cardiopulmonary disease. 2. Mild cardiomegaly, moderate  hiatal hernia and chronic lung findings stable from the prior radiographs. Electronically Signed   By: Alm Parkins M.D.   On: 09/15/2024 12:49    Procedures Procedures   Medications Ordered in UC Medications  ipratropium-albuterol  (DUONEB) 0.5-2.5 (3) MG/3ML nebulizer solution 3 mL (3 mLs Nebulization Given 09/15/24 1232)    Initial Impression / Assessment and Plan / UC Course  I have reviewed the  triage vital signs and the nursing notes.  Pertinent labs & imaging results that were available during my care of the patient were reviewed by me and considered in my medical decision making (see chart for details).  Afebrile in clinic, hypertensive 180/80, sating 93% room air She does not appear tachypneic on my exam. Normocardic.   Duoneb given with much improvement. Patient feeling better  Chest xray on initial read by this provider with concern for opacity in the right lower lobe, new compared to prior reading in 2023.  Radiology reads as negative. With symptoms and my concern for pneumonia will treat for atypical infection, doxycycline BID x 7 days. Albuterol  inhaler 2-3 times daily. Advised strict return and ED precautions for any worsening of symptoms. Patient and daughter agreeable to plan, no questions   BP improved on recheck, 145/73   Final Clinical Impressions(s) / UC Diagnoses   Final diagnoses:  Shortness of breath  Persistent cough  Pneumonia of right lower lobe due to infectious organism     Discharge Instructions      The chest xray looks similar to your previous. The radiologist does not see a pneumonia, but I have concern for something in the right lower lobe of your lung. I am treating you with an antibiotic, Please take doxycycline as prescribed. Take with food to avoid upset stomach. Finish the full course - you should not have any leftover! Use your albuterol  inhaler 2-3 times daily for a few days.  Please go to the emergency department if symptoms worsen.     ED Prescriptions     Medication Sig Dispense Auth. Provider   doxycycline (VIBRAMYCIN) 100 MG capsule Take 1 capsule (100 mg total) by mouth 2 (two) times daily for 7 days. 14 capsule Solene Hereford, Asberry, PA-C      PDMP not reviewed this encounter.   Jeryl Asberry, PA-C 09/15/24 1336

## 2024-09-15 NOTE — ED Triage Notes (Signed)
 Patient sitting in lobby with SOB and cough. Vitals stable with slight tachypnea.

## 2024-09-23 DIAGNOSIS — J42 Unspecified chronic bronchitis: Secondary | ICD-10-CM | POA: Diagnosis not present

## 2024-11-25 ENCOUNTER — Ambulatory Visit: Admitting: Podiatry
